# Patient Record
Sex: Female | Born: 1985 | Race: White | Hispanic: No | Marital: Single | Smoking: Current every day smoker
Health system: Southern US, Community
[De-identification: ages and names within clinical notes are randomized; demographics above are authoritative.]

## PROBLEM LIST (undated history)

## (undated) DIAGNOSIS — G8929 Other chronic pain: Secondary | ICD-10-CM

## (undated) DIAGNOSIS — M869 Osteomyelitis, unspecified: Secondary | ICD-10-CM

## (undated) DIAGNOSIS — F419 Anxiety disorder, unspecified: Secondary | ICD-10-CM

## (undated) DIAGNOSIS — M797 Fibromyalgia: Secondary | ICD-10-CM

## (undated) DIAGNOSIS — M549 Dorsalgia, unspecified: Secondary | ICD-10-CM

## (undated) DIAGNOSIS — K219 Gastro-esophageal reflux disease without esophagitis: Secondary | ICD-10-CM

## (undated) DIAGNOSIS — M1990 Unspecified osteoarthritis, unspecified site: Secondary | ICD-10-CM

## (undated) DIAGNOSIS — I10 Essential (primary) hypertension: Secondary | ICD-10-CM

## (undated) DIAGNOSIS — F32A Depression, unspecified: Secondary | ICD-10-CM

## (undated) DIAGNOSIS — I499 Cardiac arrhythmia, unspecified: Secondary | ICD-10-CM

## (undated) DIAGNOSIS — R0602 Shortness of breath: Secondary | ICD-10-CM

## (undated) DIAGNOSIS — F329 Major depressive disorder, single episode, unspecified: Secondary | ICD-10-CM

## (undated) DIAGNOSIS — R51 Headache: Secondary | ICD-10-CM

## (undated) HISTORY — PX: CHOLECYSTECTOMY: SHX55

## (undated) HISTORY — PX: BACK SURGERY: SHX140

## (undated) HISTORY — DX: Dorsalgia, unspecified: M54.9

## (undated) HISTORY — DX: Essential (primary) hypertension: I10

## (undated) HISTORY — DX: Other chronic pain: G89.29

## (undated) HISTORY — DX: Gastro-esophageal reflux disease without esophagitis: K21.9

## (undated) HISTORY — PX: HIP SURGERY: SHX245

## (undated) HISTORY — DX: Unspecified osteoarthritis, unspecified site: M19.90

## (undated) HISTORY — PX: TONSILLECTOMY: SUR1361

---
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
## 2006-08-17 ENCOUNTER — Emergency Department (HOSPITAL_COMMUNITY): Admission: EM | Admit: 2006-08-17 | Discharge: 2006-08-17 | Payer: Self-pay | Admitting: *Deleted

## 2007-01-09 ENCOUNTER — Encounter: Admission: RE | Admit: 2007-01-09 | Discharge: 2007-01-09 | Payer: Self-pay | Admitting: Specialist

## 2008-03-18 DIAGNOSIS — B2790 Infectious mononucleosis, unspecified without complication: Secondary | ICD-10-CM | POA: Insufficient documentation

## 2008-03-18 DIAGNOSIS — Z8669 Personal history of other diseases of the nervous system and sense organs: Secondary | ICD-10-CM | POA: Insufficient documentation

## 2008-03-19 ENCOUNTER — Ambulatory Visit: Payer: Self-pay | Admitting: Internal Medicine

## 2008-03-19 DIAGNOSIS — K219 Gastro-esophageal reflux disease without esophagitis: Secondary | ICD-10-CM | POA: Insufficient documentation

## 2008-03-19 DIAGNOSIS — J309 Allergic rhinitis, unspecified: Secondary | ICD-10-CM | POA: Insufficient documentation

## 2008-03-19 DIAGNOSIS — I10 Essential (primary) hypertension: Secondary | ICD-10-CM | POA: Insufficient documentation

## 2008-03-19 DIAGNOSIS — J45909 Unspecified asthma, uncomplicated: Secondary | ICD-10-CM | POA: Insufficient documentation

## 2008-03-19 DIAGNOSIS — J40 Bronchitis, not specified as acute or chronic: Secondary | ICD-10-CM | POA: Insufficient documentation

## 2008-04-19 ENCOUNTER — Ambulatory Visit: Payer: Self-pay | Admitting: Internal Medicine

## 2008-04-19 ENCOUNTER — Encounter: Payer: Self-pay | Admitting: Internal Medicine

## 2008-04-19 DIAGNOSIS — R0602 Shortness of breath: Secondary | ICD-10-CM | POA: Insufficient documentation

## 2008-08-21 ENCOUNTER — Emergency Department (HOSPITAL_COMMUNITY): Admission: EM | Admit: 2008-08-21 | Discharge: 2008-08-21 | Payer: Self-pay | Admitting: Family Medicine

## 2008-08-28 ENCOUNTER — Encounter: Admission: RE | Admit: 2008-08-28 | Discharge: 2008-08-28 | Payer: Self-pay | Admitting: Specialist

## 2010-08-26 ENCOUNTER — Encounter (INDEPENDENT_AMBULATORY_CARE_PROVIDER_SITE_OTHER): Payer: Self-pay | Admitting: *Deleted

## 2010-12-21 ENCOUNTER — Encounter: Payer: Self-pay | Admitting: Neurology

## 2010-12-29 NOTE — Letter (Signed)
Summary: New Patient letter  [facility]  [address] [place], [place] [address]   Phone: [phone]  Fax: [phone]       08/26/2010 MRN: [id number]  Claire [name] [address] [place], [place]  [address]  Dear Ms. Claire [name],  Welcome to the Gastroenterology Division at [facility].    You are scheduled to see Dr. Christella Hartigan on 10/09/2010 at 9:15AM on the 3rd floor at [facility], [address]  We ask that you try to arrive at our office 15 minutes prior to your appointment time to allow for check-in.  We would like you to complete the enclosed self-administered evaluation form prior to your visit and bring it with you on the day of your appointment.  We will review it with you.  Also, please bring a complete list of all your medications or, if you prefer, bring the medication bottles and we will list them.  Please bring your insurance card so that we may make a copy of it.  If your insurance requires a referral to see a specialist, please bring your referral form from your primary care physician.  Co-payments are due at the time of your visit and may be paid by cash, check or credit card.     Your office visit will consist of a consult with your physician (includes a physical exam), any laboratory testing he/she may order, scheduling of any necessary diagnostic testing (e.g. x-ray, ultrasound, CT-scan), and scheduling of a procedure (e.g. Endoscopy, Colonoscopy) if required.  Please allow enough time on your schedule to allow for any/all of these possibilities.    If you cannot keep your appointment, please call [phone] to cancel or reschedule prior to your appointment date.  This allows [place] the opportunity to schedule an appointment for another patient in need of care.  If you do not cancel or reschedule by 5 p.m. the business day prior to your appointment date, you will be charged a $50.00 late cancellation/no-show fee.    Thank you for choosing  [facility] for your medical needs.  We appreciate the opportunity to care for you.  Please visit [place] at our website  to learn more about our practice.                     Sincerely,                                                             The Gastroenterology Division

## 2011-02-18 ENCOUNTER — Other Ambulatory Visit (HOSPITAL_COMMUNITY): Payer: Self-pay | Admitting: General Surgery

## 2011-02-24 ENCOUNTER — Ambulatory Visit: Payer: BC Managed Care – PPO | Admitting: *Deleted

## 2011-02-24 ENCOUNTER — Ambulatory Visit (HOSPITAL_COMMUNITY)
Admission: RE | Admit: 2011-02-24 | Discharge: 2011-02-24 | Disposition: A | Discharge status: Home / Self Care | Payer: BC Managed Care – PPO | Source: Ambulatory Visit | Attending: General Surgery | Admitting: General Surgery

## 2011-02-24 ENCOUNTER — Ambulatory Visit (HOSPITAL_COMMUNITY): Payer: BC Managed Care – PPO

## 2011-02-24 ENCOUNTER — Other Ambulatory Visit (HOSPITAL_COMMUNITY): Payer: Self-pay | Admitting: General Surgery

## 2011-02-24 DIAGNOSIS — F329 Major depressive disorder, single episode, unspecified: Secondary | ICD-10-CM | POA: Insufficient documentation

## 2011-02-24 DIAGNOSIS — M129 Arthropathy, unspecified: Secondary | ICD-10-CM | POA: Insufficient documentation

## 2011-02-24 DIAGNOSIS — IMO0001 Reserved for inherently not codable concepts without codable children: Secondary | ICD-10-CM | POA: Insufficient documentation

## 2011-02-24 DIAGNOSIS — M25559 Pain in unspecified hip: Secondary | ICD-10-CM | POA: Insufficient documentation

## 2011-02-24 DIAGNOSIS — Z6841 Body Mass Index (BMI) 40.0 and over, adult: Secondary | ICD-10-CM | POA: Insufficient documentation

## 2011-02-24 DIAGNOSIS — F17200 Nicotine dependence, unspecified, uncomplicated: Secondary | ICD-10-CM | POA: Insufficient documentation

## 2011-02-24 DIAGNOSIS — F3289 Other specified depressive episodes: Secondary | ICD-10-CM | POA: Insufficient documentation

## 2011-02-24 DIAGNOSIS — Z13820 Encounter for screening for osteoporosis: Secondary | ICD-10-CM | POA: Insufficient documentation

## 2011-03-08 ENCOUNTER — Ambulatory Visit (HOSPITAL_COMMUNITY)
Admission: RE | Admit: 2011-03-08 | Discharge: 2011-03-08 | Disposition: A | Discharge status: Home / Self Care | Payer: BC Managed Care – PPO | Source: Ambulatory Visit | Attending: General Surgery | Admitting: General Surgery

## 2011-03-08 DIAGNOSIS — Z01818 Encounter for other preprocedural examination: Secondary | ICD-10-CM | POA: Insufficient documentation

## 2011-03-08 DIAGNOSIS — Z6841 Body Mass Index (BMI) 40.0 and over, adult: Secondary | ICD-10-CM | POA: Insufficient documentation

## 2011-03-10 ENCOUNTER — Encounter: Payer: BC Managed Care – PPO | Admitting: *Deleted

## 2011-03-18 ENCOUNTER — Encounter: Payer: BC Managed Care – PPO | Admitting: *Deleted

## 2011-04-08 ENCOUNTER — Encounter: Payer: BC Managed Care – PPO | Attending: General Surgery | Admitting: *Deleted

## 2011-04-08 DIAGNOSIS — Z713 Dietary counseling and surveillance: Secondary | ICD-10-CM | POA: Insufficient documentation

## 2011-04-08 DIAGNOSIS — Z01818 Encounter for other preprocedural examination: Secondary | ICD-10-CM | POA: Insufficient documentation

## 2011-04-16 NOTE — Op Note (Signed)
[facility]  Patient:    Claire [name], Claire [name]              MRN: [id number] Proc. Date: 03/09/01 Adm. Date:  [id number] Attending:  Cain Sieve                           Operative Report  PREOPERATIVE DIAGNOSES:  Retained cannulated screw, left hip, following pinning for a left slipped capital femoral hip epiphysis.  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  PROCEDURE:  Removal of retained cannulated screw, left hip.  SURGEON:  Vania Rea. Supple, M.D.  ANESTHESIA:  General endotracheal.  ESTIMATED BLOOD LOSS:  50 cc.  DRAINS:  None.  HISTORY:  Pavneet is a 25 year old female, without approximately two years ago underwent a left hip pinning for a slipped capital femoral epiphysis.  She has done well, has returned to all of her regular activities.  Recent radiographs show complete healing of the proximal femoral physis on the left.  She is brought to the operating room at this time for planned elective removal of her retained hip screw.  Preoperatively Santrice and her family were counseled on treatment options as well as risks versus benefits.  Possible complications of bleeding, infection, neurovascular injury, a DVT, persistence of pain, failure to be able to completely remove the implant, possibility for femoral neck fracture secondary to stress riser effect were all reviewed.  They understand and accept and agree with our planned procedure.  DESCRIPTION OF PROCEDURE:  After undergoing routine preoperative evaluation, the patient was brought to the operating room and placed supine on the fracture table where she underwent smooth induction of general endotracheal anesthesia.  Received 1 gram of IV cephalosporin prophylactically.  Left leg was placed into gentle longitudinal traction with right leg placed in well leg holder.  She was appropriately  padded and protected.  Fluoroscopy was brought in and used to confirm appropriate visualization of the hips and hip screw on both AP and lateral views.  Left hip girdle region was then sterilely prepped and draped in a standard fashion.  Utilizing fluoroscopic guidance, alignment for appropriate approach to the hip screw was then made, and a stab wound was then made at the site of the previous operative site, approximately 2 cm in length.  We initially utilized the arthroscopic arthroscope cannula and trocar to bluntly dissect down to the appropriate point at the screw.  A guidepin was placed through this, gaining access into the canal of the cannulated screw. We then placed a T-handled screw over this.  Good engagement was obtained. The screw was very firmly seated and as we attempted to extract the screw, the threads on the tip of the cannulated titanium screw stripped.  It was not possible to extract the screw in this manner.  At this point, we obtained the large trephines and passed them over the guidepin and over the head of the cannulated screw and progressively reamed down around the outside of the cannulated screw to the depth of the threads at the tip of the screw.  Proper positioning and appropriate progress were confirmed with both AP and lateral fluoroscopic images.  Once we had achieved an appropriate depth to loosen the screw, we then placed an Easy-Out screw remover into the canal of the cannulated screw.  This was attached to a manual T-handled Jacobs chuck.  We then carefully  used this to gain good purchase on the screw and were then able to successfully remove the screw in its entirety.  At this point, fluoroscopic images were then used to confirm that the screw had been removed. No evidence for intraoperative fractures.  The wound was then copiously irrigated.  The lateral skin incision had been extended to allow the T-handled Jacobs chuck to pass for a total length of  approximately 4 cm.  This was then closed with 2-0 Vicryl to the subcutaneous and a running intracuticular 4-0 Monocryl for the skin.  Steri-Strips were applied.  We then injected approximately 20 cc of 0.5% Marcaine with epinephrine into the peri-incisional soft tissues.  A bulky dry dressing was then taped over the left hip.  The patient was then placed in the supine position, extubated, and taken to the recovery room in stable condition. DD:  03/09/01 TD:  03/09/01 Job: [id number]
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
## 2011-08-30 LAB — RAPID URINE DRUG SCREEN, HOSP PERFORMED
Amphetamines: NOT DETECTED
Tetrahydrocannabinol: NOT DETECTED

## 2011-09-29 ENCOUNTER — Inpatient Hospital Stay (HOSPITAL_COMMUNITY): Admission: RE | Admit: 2011-09-29 | Payer: BC Managed Care – PPO | Source: Ambulatory Visit | Admitting: General Surgery

## 2011-12-15 NOTE — Progress Notes (Signed)
  Bariatric Class:  Appt start time: 0830 end time:  0930.  Pre-Operative Nutrition Class  Patient was seen on 12/16/11 for Pre-Operative Bariatric Surgery Education at the [facility].  Surgery date: 01/03/12 Surgery type: RNY Gastric Bypass  Weight today: None taken Last weight: 299.5 lbs (04/08/11) BMI: 44.2  Samples given per MNT protocol: Bariatric Advantage Multivitamin Lot # [id number] Exp: 9/13  Bariatric Advantage Calcium Citrate Lot # [id number] Exp: 12/13  Celebrate Vitamins B12 ( ) Lot # [id number] Exp: 7/14  Celebrate Vitamins Multivitamin Lot # [id number] Exp:6/14  Celebrate VitaminsCalcium Citrate Lot # [id number] Exp: 5/14  Unjury Protein - Chicken Soup Lot # [id number] Exp: 8/13  The following the learning objective met by the patient during this course:   Identifies Pre-Op Dietary Goals and will begin 2 weeks pre-operatively   Identifies appropriate sources of fluids and proteins   States protein recommendations and appropriate sources pre and post-operatively  Identifies Post-Operative Dietary Goals and will follow for 2 weeks post-operatively  Identifies appropriate multivitamin and calcium sources  Describes the need for physical activity post-operatively and will follow MD recommendations  States when to call healthcare provider regarding medication questions or post-operative complications  Handouts given during class include:  Pre-Op Bariatric Surgery Diet Handout  Protein Shake Handout  Post-Op Bariatric Surgery Nutrition Handout  BELT Program Information Flyer  Support Group Information Flyer  Follow-Up Plan: Patient will follow-up at [facility] 2 weeks post operatively for diet advancement per MD.

## 2011-12-15 NOTE — Patient Instructions (Signed)
Follow:    Pre-Op Diet per MD 2 weeks prior to surgery  Phase 2- Liquids (clear/full) 2 weeks after surgery  Vitamin/Mineral/Calcium guidelines for purchasing bariatric supplements  Exercise guidelines pre and post-op per MD  Follow-up at [facility] in 2 weeks post-op for diet advancement. Contact Kenyetta Wimbish as needed with questions/concerns.  

## 2011-12-16 ENCOUNTER — Encounter: Payer: BC Managed Care – PPO | Attending: General Surgery

## 2011-12-16 DIAGNOSIS — Z713 Dietary counseling and surveillance: Secondary | ICD-10-CM | POA: Insufficient documentation

## 2011-12-16 DIAGNOSIS — Z01818 Encounter for other preprocedural examination: Secondary | ICD-10-CM | POA: Insufficient documentation

## 2011-12-21 ENCOUNTER — Encounter (HOSPITAL_COMMUNITY): Payer: Self-pay | Admitting: Pharmacy Technician

## 2011-12-22 ENCOUNTER — Encounter (INDEPENDENT_AMBULATORY_CARE_PROVIDER_SITE_OTHER): Payer: Self-pay | Admitting: General Surgery

## 2011-12-22 ENCOUNTER — Ambulatory Visit (INDEPENDENT_AMBULATORY_CARE_PROVIDER_SITE_OTHER): Payer: BC Managed Care – PPO | Admitting: General Surgery

## 2011-12-22 MED ORDER — BUPIVACAINE 0.25 % ON-Q PUMP DUAL CATH 300 ML: 300.0000 mL | INJECTION | Status: DC
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
## 2011-12-29 ENCOUNTER — Encounter (HOSPITAL_COMMUNITY): Payer: Self-pay

## 2011-12-29 ENCOUNTER — Encounter (HOSPITAL_COMMUNITY)
Admission: RE | Admit: 2011-12-29 | Discharge: 2011-12-29 | Disposition: A | Discharge status: Home / Self Care | Payer: BC Managed Care – PPO | Source: Ambulatory Visit | Attending: General Surgery | Admitting: General Surgery

## 2011-12-29 HISTORY — DX: Major depressive disorder, single episode, unspecified: F32.9

## 2011-12-29 HISTORY — DX: Anxiety disorder, unspecified: F41.9

## 2011-12-29 HISTORY — DX: Fibromyalgia: M79.7

## 2011-12-29 HISTORY — DX: Headache: R51

## 2011-12-29 HISTORY — DX: Shortness of breath: R06.02

## 2011-12-29 HISTORY — DX: Depression, unspecified: F32.A

## 2011-12-29 HISTORY — DX: Cardiac arrhythmia, unspecified: I49.9

## 2011-12-29 LAB — CBC
HCT: 48.3 % — ABNORMAL HIGH (ref 36.0–46.0)
Hemoglobin: 16.6 g/dL — ABNORMAL HIGH (ref 12.0–15.0)
MCV: 86.9 fL (ref 78.0–100.0)
RBC: 5.56 MIL/uL — ABNORMAL HIGH (ref 3.87–5.11)
RDW: 12.6 % (ref 11.5–15.5)
WBC: 8.9 10*3/uL (ref 4.0–10.5)

## 2011-12-29 LAB — DIFFERENTIAL
Eosinophils Absolute: 0.7 10*3/uL (ref 0.0–0.7)
Eosinophils Relative: 8 % — ABNORMAL HIGH (ref 0–5)
Lymphs Abs: 2 10*3/uL (ref 0.7–4.0)
Monocytes Absolute: 0.4 10*3/uL (ref 0.1–1.0)
Monocytes Relative: 5 % (ref 3–12)

## 2011-12-29 LAB — COMPREHENSIVE METABOLIC PANEL
BUN: 19 mg/dL (ref 6–23)
CO2: 26 mEq/L (ref 19–32)
Chloride: 101 mEq/L (ref 96–112)
Creatinine, Ser: 1.05 mg/dL (ref 0.50–1.10)
GFR calc Af Amer: 84 mL/min — ABNORMAL LOW (ref >90)
GFR calc non Af Amer: 73 mL/min — ABNORMAL LOW (ref >90)
Glucose, Bld: 90 mg/dL (ref 70–99)
Total Bilirubin: 0.3 mg/dL (ref 0.3–1.2)

## 2011-12-29 LAB — HCG, SERUM, QUALITATIVE: Preg, Serum: NEGATIVE

## 2011-12-29 NOTE — Patient Instructions (Signed)
19 Claire [name]  12/29/2011   Your procedure is scheduled on:  01/03/12 [id number]  Report to [facility] at 0515 AM.  Call this number if you have problems the morning of surgery: [phone]   Remember:   Do not eat food:After Midnight.  May have clear liquids:until Midnight .  Marland Kitchen  Take these medicines the morning of surgery with A SIP OF WATER:    Do not wear jewelry, make-up or nail polish.  Do not wear lotions, powders, or perfumes.   Do not shave 48 hours prior to surgery.  Do not bring valuables to the hospital.  Contacts, dentures or bridgework may not be worn into surgery.  Leave suitcase in the car. After surgery it may be brought to your room.  For patients admitted to the hospital, checkout time is 11:00 AM the day of discharge.       Special Instructions: CHG Shower Use Special Wash: 1/2 bottle night before surgery and 1/2 bottle morning of surgery. shower chin to toes with CHG.  Wash face and private parts with regular soap.     Please read over the following fact sheets that you were given: MRSA Information, coughing and deep breathing exercises, leg exercises

## 2011-12-31 NOTE — Pre-Procedure Instructions (Signed)
12/31/11 PT states she did not feel well on preop appt.  Nurse called today to followup on pt status and mother stated she was feeling better.  Mother states had a problem with some asthma and she had to put her back on her nebulizer.  Nurse instructed mother to inform Dr Johna Sheriff today of pt status .  Mother voiced understanding.

## 2012-01-03 ENCOUNTER — Encounter (HOSPITAL_COMMUNITY): Payer: Self-pay | Admitting: Surgery

## 2012-01-03 ENCOUNTER — Inpatient Hospital Stay (HOSPITAL_COMMUNITY)
Admission: RE | Admit: 2012-01-03 | Discharge: 2012-01-06 | DRG: 288 | Disposition: A | Discharge status: Home / Self Care | Payer: BC Managed Care – PPO | Source: Ambulatory Visit | Attending: General Surgery | Admitting: General Surgery

## 2012-01-03 ENCOUNTER — Encounter (HOSPITAL_COMMUNITY): Payer: Self-pay | Admitting: *Deleted

## 2012-01-03 ENCOUNTER — Encounter (HOSPITAL_COMMUNITY)
Admission: RE | Disposition: A | Discharge status: Home / Self Care | Payer: Self-pay | Source: Ambulatory Visit | Attending: General Surgery

## 2012-01-03 ENCOUNTER — Inpatient Hospital Stay (HOSPITAL_COMMUNITY): Payer: BC Managed Care – PPO | Admitting: *Deleted

## 2012-01-03 DIAGNOSIS — K219 Gastro-esophageal reflux disease without esophagitis: Secondary | ICD-10-CM | POA: Diagnosis present

## 2012-01-03 DIAGNOSIS — J45909 Unspecified asthma, uncomplicated: Secondary | ICD-10-CM

## 2012-01-03 DIAGNOSIS — Z6841 Body Mass Index (BMI) 40.0 and over, adult: Secondary | ICD-10-CM

## 2012-01-03 DIAGNOSIS — M129 Arthropathy, unspecified: Secondary | ICD-10-CM | POA: Diagnosis present

## 2012-01-03 DIAGNOSIS — Z87891 Personal history of nicotine dependence: Secondary | ICD-10-CM

## 2012-01-03 DIAGNOSIS — Z01812 Encounter for preprocedural laboratory examination: Secondary | ICD-10-CM

## 2012-01-03 DIAGNOSIS — I10 Essential (primary) hypertension: Secondary | ICD-10-CM

## 2012-01-03 HISTORY — PX: GASTRIC ROUX-EN-Y: SHX5262

## 2012-01-03 LAB — HEMOGLOBIN AND HEMATOCRIT, BLOOD
HCT: 45.1 % (ref 36.0–46.0)
Hemoglobin: 15.2 g/dL — ABNORMAL HIGH (ref 12.0–15.0)

## 2012-01-03 SURGERY — LAPAROSCOPIC ROUX-EN-Y GASTRIC
Anesthesia: General | Site: Abdomen | Wound class: Clean Contaminated

## 2012-01-03 MED ORDER — BUPIVACAINE-EPINEPHRINE 0.25% -1:200000 IJ SOLN
INTRAMUSCULAR | Status: DC | PRN
  Administered 2012-01-03: 35 mL

## 2012-01-03 MED ORDER — ACETAMINOPHEN 10 MG/ML IV SOLN
INTRAVENOUS | Status: AC
  Filled 2012-01-03: qty 100

## 2012-01-03 MED ORDER — GLYCOPYRROLATE 0.2 MG/ML IJ SOLN
INTRAMUSCULAR | Status: DC | PRN
  Administered 2012-01-03: 0.2 mg via INTRAVENOUS
  Administered 2012-01-03: .1 mg via INTRAVENOUS

## 2012-01-03 MED ORDER — MEPERIDINE HCL 25 MG/ML IJ SOLN: 6.2500 mg | INTRAMUSCULAR | Status: DC | PRN

## 2012-01-03 MED ORDER — TISSEEL VH 10 ML EX KIT
PACK | CUTANEOUS | Status: DC | PRN
  Administered 2012-01-03: 10 mL

## 2012-01-03 MED ORDER — OXYCODONE-ACETAMINOPHEN 5-325 MG/5ML PO SOLN
5.0000 mL | ORAL | Status: DC | PRN
  Administered 2012-01-05: 10 mL via ORAL
  Administered 2012-01-06: 5 mL via ORAL
  Filled 2012-01-03: qty 10
  Filled 2012-01-03: qty 5

## 2012-01-03 MED ORDER — CEFOXITIN SODIUM-DEXTROSE 1-4 GM-% IV SOLR (PREMIX)
INTRAVENOUS | Status: AC
  Filled 2012-01-03: qty 100

## 2012-01-03 MED ORDER — BUPIVACAINE-EPINEPHRINE 0.25% -1:200000 IJ SOLN
INTRAMUSCULAR | Status: AC
  Filled 2012-01-03: qty 1

## 2012-01-03 MED ORDER — KCL IN DEXTROSE-NACL 20-5-0.9 MEQ/L-%-% IV SOLN
INTRAVENOUS | Status: DC
  Administered 2012-01-03 – 2012-01-06 (×5): via INTRAVENOUS
  Filled 2012-01-03 (×10): qty 1000

## 2012-01-03 MED ORDER — HYDROMORPHONE HCL PF 1 MG/ML IJ SOLN
1.0000 mg | INTRAMUSCULAR | Status: DC | PRN
  Administered 2012-01-03 (×2): 2 mg via INTRAVENOUS
  Administered 2012-01-03 (×2): 1 mg via INTRAVENOUS
  Administered 2012-01-04 (×7): 2 mg via INTRAVENOUS
  Administered 2012-01-04: 1 mg via INTRAVENOUS
  Administered 2012-01-04 – 2012-01-05 (×4): 2 mg via INTRAVENOUS
  Administered 2012-01-05: 1 mg via INTRAVENOUS
  Administered 2012-01-05 (×4): 2 mg via INTRAVENOUS
  Administered 2012-01-06 (×2): 1 mg via INTRAVENOUS
  Administered 2012-01-06: 2 mg via INTRAVENOUS
  Administered 2012-01-06: 1 mg via INTRAVENOUS
  Filled 2012-01-03: qty 1
  Filled 2012-01-03 (×12): qty 2
  Filled 2012-01-03: qty 1
  Filled 2012-01-03 (×2): qty 2
  Filled 2012-01-03: qty 1
  Filled 2012-01-03 (×2): qty 2
  Filled 2012-01-03: qty 1
  Filled 2012-01-03: qty 2
  Filled 2012-01-03: qty 1
  Filled 2012-01-03 (×3): qty 2

## 2012-01-03 MED ORDER — LACTATED RINGERS IV SOLN
INTRAVENOUS | Status: DC
  Administered 2012-01-03: 12:00:00 via INTRAVENOUS

## 2012-01-03 MED ORDER — BUPIVACAINE 0.25 % ON-Q PUMP DUAL CATH 300 ML: 300.0000 mL | INJECTION | Status: DC

## 2012-01-03 MED ORDER — ONDANSETRON HCL 4 MG/2ML IJ SOLN
4.0000 mg | INTRAMUSCULAR | Status: DC | PRN
  Administered 2012-01-03 – 2012-01-06 (×12): 4 mg via INTRAVENOUS
  Filled 2012-01-03 (×13): qty 2

## 2012-01-03 MED ORDER — PROMETHAZINE HCL 25 MG/ML IJ SOLN
INTRAMUSCULAR | Status: AC
  Filled 2012-01-03: qty 1

## 2012-01-03 MED ORDER — HYDROMORPHONE HCL PF 1 MG/ML IJ SOLN
INTRAMUSCULAR | Status: AC
  Filled 2012-01-03: qty 1

## 2012-01-03 MED ORDER — ALBUTEROL SULFATE (5 MG/ML) 0.5% IN NEBU: 2.5000 mg | INHALATION_SOLUTION | Freq: Four times a day (QID) | RESPIRATORY_TRACT | Status: DC | PRN

## 2012-01-03 MED ORDER — LIDOCAINE HCL (CARDIAC) 20 MG/ML IV SOLN
INTRAVENOUS | Status: DC | PRN
  Administered 2012-01-03: 80 mg via INTRAVENOUS

## 2012-01-03 MED ORDER — METOPROLOL TARTRATE 1 MG/ML IV SOLN
10.0000 mg | Freq: Four times a day (QID) | INTRAVENOUS | Status: DC
  Administered 2012-01-03 – 2012-01-06 (×10): 10 mg via INTRAVENOUS
  Filled 2012-01-03 (×16): qty 10

## 2012-01-03 MED ORDER — DEXAMETHASONE SODIUM PHOSPHATE 10 MG/ML IJ SOLN
INTRAMUSCULAR | Status: DC | PRN
  Administered 2012-01-03: 10 mg via INTRAVENOUS

## 2012-01-03 MED ORDER — 0.9 % SODIUM CHLORIDE (POUR BTL) OPTIME
TOPICAL | Status: DC | PRN
  Administered 2012-01-03: 1000 mL

## 2012-01-03 MED ORDER — LACTATED RINGERS IV SOLN
INTRAVENOUS | Status: DC | PRN
  Administered 2012-01-03 (×3): via INTRAVENOUS

## 2012-01-03 MED ORDER — BUDESONIDE-FORMOTEROL FUMARATE 160-4.5 MCG/ACT IN AERO
2.0000 | INHALATION_SPRAY | Freq: Two times a day (BID) | RESPIRATORY_TRACT | Status: DC
  Administered 2012-01-03 – 2012-01-06 (×7): 2 via RESPIRATORY_TRACT
  Filled 2012-01-03: qty 6

## 2012-01-03 MED ORDER — FENTANYL CITRATE 0.05 MG/ML IJ SOLN
INTRAMUSCULAR | Status: DC | PRN
  Administered 2012-01-03 (×2): 50 ug via INTRAVENOUS
  Administered 2012-01-03: 100 ug via INTRAVENOUS
  Administered 2012-01-03: 50 ug via INTRAVENOUS
  Administered 2012-01-03 (×3): 100 ug via INTRAVENOUS
  Administered 2012-01-03 (×4): 50 ug via INTRAVENOUS

## 2012-01-03 MED ORDER — UNJURY CHICKEN SOUP POWDER: 2.0000 [oz_av] | Freq: Four times a day (QID) | ORAL | Status: DC

## 2012-01-03 MED ORDER — NEOSTIGMINE METHYLSULFATE 1 MG/ML IJ SOLN
INTRAMUSCULAR | Status: DC | PRN
  Administered 2012-01-03: 1 mg via INTRAVENOUS

## 2012-01-03 MED ORDER — FIBRIN SEALANT COMPONENT 5 ML EX KIT
PACK | CUTANEOUS | Status: AC
  Filled 2012-01-03: qty 4

## 2012-01-03 MED ORDER — UNJURY CHOCOLATE CLASSIC POWDER
2.0000 [oz_av] | Freq: Four times a day (QID) | ORAL | Status: DC
  Administered 2012-01-05 – 2012-01-06 (×3): 2 [oz_av] via ORAL

## 2012-01-03 MED ORDER — HEPARIN SODIUM (PORCINE) 5000 UNIT/ML IJ SOLN
5000.0000 [IU] | INTRAMUSCULAR | Status: AC
  Administered 2012-01-03: 5000 [IU] via SUBCUTANEOUS

## 2012-01-03 MED ORDER — DROPERIDOL 2.5 MG/ML IJ SOLN
0.6250 mg | INTRAMUSCULAR | Status: DC | PRN
  Administered 2012-01-03: 0.625 mg via INTRAVENOUS
  Filled 2012-01-03: qty 0.25

## 2012-01-03 MED ORDER — PROPOFOL 10 MG/ML IV EMUL
INTRAVENOUS | Status: DC | PRN
  Administered 2012-01-03: 200 mg via INTRAVENOUS

## 2012-01-03 MED ORDER — PROMETHAZINE HCL 25 MG/ML IJ SOLN
6.2500 mg | INTRAMUSCULAR | Status: AC | PRN
  Administered 2012-01-03 (×2): 6.25 mg via INTRAVENOUS

## 2012-01-03 MED ORDER — ACETAMINOPHEN 10 MG/ML IV SOLN
INTRAVENOUS | Status: DC | PRN
  Administered 2012-01-03: 1000 mg via INTRAVENOUS

## 2012-01-03 MED ORDER — UNJURY VANILLA POWDER
2.0000 [oz_av] | Freq: Four times a day (QID) | ORAL | Status: DC
  Administered 2012-01-05: 2 [oz_av] via ORAL

## 2012-01-03 MED ORDER — HEPARIN SODIUM (PORCINE) 5000 UNIT/ML IJ SOLN
5000.0000 [IU] | Freq: Three times a day (TID) | INTRAMUSCULAR | Status: DC
  Administered 2012-01-03 – 2012-01-06 (×8): 5000 [IU] via SUBCUTANEOUS
  Filled 2012-01-03 (×11): qty 1

## 2012-01-03 MED ORDER — DEXTROSE 5 % IV SOLN: 2.0000 g | INTRAVENOUS | Status: DC

## 2012-01-03 MED ORDER — LACTATED RINGERS IV SOLN: INTRAVENOUS | Status: DC

## 2012-01-03 MED ORDER — DEXTROSE 5 % IV SOLN
1.0000 g | INTRAVENOUS | Status: DC | PRN
  Administered 2012-01-03: 1 g via INTRAVENOUS

## 2012-01-03 MED ORDER — ONDANSETRON HCL 4 MG/2ML IJ SOLN
INTRAMUSCULAR | Status: DC | PRN
  Administered 2012-01-03: 3 mg via INTRAVENOUS
  Administered 2012-01-03: 1 mg via INTRAVENOUS

## 2012-01-03 MED ORDER — MIDAZOLAM HCL 5 MG/5ML IJ SOLN
INTRAMUSCULAR | Status: DC | PRN
  Administered 2012-01-03 (×2): 1 mg via INTRAVENOUS

## 2012-01-03 MED ORDER — STERILE WATER FOR IRRIGATION IR SOLN
Status: DC | PRN
  Administered 2012-01-03: 1500 mL

## 2012-01-03 MED ORDER — ACETAMINOPHEN 160 MG/5ML PO SOLN: 650.0000 mg | ORAL | Status: DC | PRN

## 2012-01-03 MED ORDER — SUCCINYLCHOLINE CHLORIDE 20 MG/ML IJ SOLN
INTRAMUSCULAR | Status: DC | PRN
  Administered 2012-01-03: 140 mg via INTRAVENOUS

## 2012-01-03 MED ORDER — ALBUTEROL SULFATE HFA 108 (90 BASE) MCG/ACT IN AERS
2.0000 | INHALATION_SPRAY | Freq: Four times a day (QID) | RESPIRATORY_TRACT | Status: DC | PRN
  Filled 2012-01-03: qty 6.7

## 2012-01-03 MED ORDER — CISATRACURIUM BESYLATE 2 MG/ML IV SOLN
INTRAVENOUS | Status: DC | PRN
  Administered 2012-01-03: 2 mg via INTRAVENOUS
  Administered 2012-01-03: 6 mg via INTRAVENOUS
  Administered 2012-01-03: 2 mg via INTRAVENOUS
  Administered 2012-01-03 (×2): 6 mg via INTRAVENOUS

## 2012-01-03 MED ORDER — HYDROMORPHONE HCL PF 1 MG/ML IJ SOLN
INTRAMUSCULAR | Status: AC
Start: 2012-01-03 — End: 2012-01-03
  Filled 2012-01-03: qty 1

## 2012-01-03 MED ORDER — PROMETHAZINE HCL 25 MG/ML IJ SOLN
12.5000 mg | INTRAMUSCULAR | Status: DC | PRN
  Administered 2012-01-03 – 2012-01-06 (×13): 12.5 mg via INTRAVENOUS
  Filled 2012-01-03 (×13): qty 1

## 2012-01-03 MED ORDER — HYDROMORPHONE HCL PF 1 MG/ML IJ SOLN
0.2500 mg | INTRAMUSCULAR | Status: DC | PRN
  Administered 2012-01-03 (×4): 0.5 mg via INTRAVENOUS

## 2012-01-03 MED ORDER — ALBUTEROL SULFATE HFA 108 (90 BASE) MCG/ACT IN AERS
INHALATION_SPRAY | RESPIRATORY_TRACT | Status: AC
  Filled 2012-01-03: qty 6.7

## 2012-01-03 MED ORDER — LACTATED RINGERS IR SOLN
Status: DC | PRN
  Administered 2012-01-03: 3000 mL

## 2012-01-03 MED ORDER — BUPIVACAINE 0.25 % ON-Q PUMP DUAL CATH 300 ML
300.0000 mL | INJECTION | Status: DC
  Administered 2012-01-03: 300 mL
  Filled 2012-01-03: qty 300

## 2012-01-03 SURGICAL SUPPLY — 87 items
ADH SKN CLS APL DERMABOND .7 (GAUZE/BANDAGES/DRESSINGS) ×1
APL SKNCLS STERI-STRIP NONHPOA (GAUZE/BANDAGES/DRESSINGS)
APL SRG 32X5 SNPLK LF DISP (MISCELLANEOUS) ×1
APPLICATOR COTTON TIP 6IN STRL (MISCELLANEOUS) ×2 IMPLANT
APPLIER CLIP ROT 13.4 12 LRG (CLIP)
APR CLP LRG 13.4X12 ROT 20 MLT (CLIP)
BAG SPEC RTRVL LRG 6X4 10 (ENDOMECHANICALS)
BENZOIN TINCTURE PRP APPL 2/3 (GAUZE/BANDAGES/DRESSINGS) IMPLANT
BLADE SURG 15 STRL LF DISP TIS (BLADE) IMPLANT
BLADE SURG 15 STRL SS (BLADE)
BLADE SURG SZ11 CARB STEEL (BLADE) ×2 IMPLANT
CABLE HIGH FREQUENCY MONO STRZ (ELECTRODE) ×1 IMPLANT
CANISTER SUCTION 2500CC (MISCELLANEOUS) ×2 IMPLANT
CATH KIT ON-Q SILVERSOAK 10 (CATHETERS) IMPLANT
CATH KIT ON-Q SILVERSOAKER 10 (CATHETERS) ×4 IMPLANT
CHLORAPREP W/TINT 26ML (MISCELLANEOUS) ×2 IMPLANT
CLIP APPLIE ROT 13.4 12 LRG (CLIP) IMPLANT
CLIP SUT LAPRA TY ABSORB (SUTURE) ×4 IMPLANT
CLOTH BEACON ORANGE TIMEOUT ST (SAFETY) ×2 IMPLANT
COVER SURGICAL LIGHT HANDLE (MISCELLANEOUS) ×2 IMPLANT
CUTTER LINEAR ENDO ART 45 ETS (STAPLE) ×2 IMPLANT
DECANTER SPIKE VIAL GLASS SM (MISCELLANEOUS) ×2 IMPLANT
DERMABOND ADVANCED (GAUZE/BANDAGES/DRESSINGS) ×1
DERMABOND ADVANCED .7 DNX12 (GAUZE/BANDAGES/DRESSINGS) IMPLANT
DEVICE SUTURE ENDOST 10MM (ENDOMECHANICALS) ×2 IMPLANT
DISSECTOR BLUNT TIP ENDO 5MM (MISCELLANEOUS) IMPLANT
DRAIN PENROSE 18X1/4 LTX STRL (WOUND CARE) ×2 IMPLANT
DRAPE CAMERA CLOSED 9X96 (DRAPES) ×2 IMPLANT
ELECT REM PT RETURN 9FT ADLT (ELECTROSURGICAL) ×2
ELECTRODE REM PT RTRN 9FT ADLT (ELECTROSURGICAL) ×1 IMPLANT
GAUZE SPONGE 4X4 16PLY XRAY LF (GAUZE/BANDAGES/DRESSINGS) ×2 IMPLANT
GLOVE BIOGEL PI IND STRL 7.0 (GLOVE) ×1 IMPLANT
GLOVE BIOGEL PI INDICATOR 7.0 (GLOVE) ×1
GOWN STRL NON-REIN LRG LVL3 (GOWN DISPOSABLE) ×2 IMPLANT
GOWN STRL REIN XL XLG (GOWN DISPOSABLE) ×4 IMPLANT
HEMOSTAT SURGICEL 4X8 (HEMOSTASIS) IMPLANT
HOVERMATT SINGLE USE (MISCELLANEOUS) ×2 IMPLANT
KIT BASIN OR (CUSTOM PROCEDURE TRAY) ×2 IMPLANT
KIT GASTRIC LAVAGE 34FR ADT (SET/KITS/TRAYS/PACK) ×2 IMPLANT
NDL SPNL 22GX3.5 QUINCKE BK (NEEDLE) ×1 IMPLANT
NEEDLE SPNL 22GX3.5 QUINCKE BK (NEEDLE) ×2 IMPLANT
NS IRRIG 1000ML POUR BTL (IV SOLUTION) ×2 IMPLANT
PACK CARDIOVASCULAR III (CUSTOM PROCEDURE TRAY) ×2 IMPLANT
PEN SKIN MARKING BROAD (MISCELLANEOUS) ×2 IMPLANT
POUCH SPECIMEN RETRIEVAL 10MM (ENDOMECHANICALS) IMPLANT
RELOAD 45 VASCULAR/THIN (ENDOMECHANICALS) ×2 IMPLANT
RELOAD BLUE (STAPLE) ×4 IMPLANT
RELOAD ENDO STITCH 2.0 (ENDOMECHANICALS) ×20
RELOAD GOLD (STAPLE) ×2 IMPLANT
RELOAD STAPLE 45 2.5 WHT GRN (ENDOMECHANICALS) ×2 IMPLANT
RELOAD STAPLE 45 3.5 BLU ETS (ENDOMECHANICALS) ×1 IMPLANT
RELOAD STAPLE TA45 3.5 REG BLU (ENDOMECHANICALS) ×4 IMPLANT
RELOAD SUT SNGL STCH ABSRB 2-0 (ENDOMECHANICALS) ×5 IMPLANT
RELOAD SUT SNGL STCH BLK 2-0 (ENDOMECHANICALS) ×3 IMPLANT
RELOAD WHITE ECR60W (STAPLE) ×1 IMPLANT
SCALPEL HARMONIC ACE (MISCELLANEOUS) ×2 IMPLANT
SCISSORS LAP 5X35 DISP (ENDOMECHANICALS) ×2 IMPLANT
SEALANT SURGICAL APPL DUAL CAN (MISCELLANEOUS) ×2 IMPLANT
SET IRRIG TUBING LAPAROSCOPIC (IRRIGATION / IRRIGATOR) ×2 IMPLANT
SLEEVE ADV FIXATION 12X100MM (TROCAR) ×4 IMPLANT
SLEEVE ADV FIXATION 5X100MM (TROCAR) ×2 IMPLANT
SOLUTION ANTI FOG 6CC (MISCELLANEOUS) ×2 IMPLANT
SPONGE GAUZE 4X4 12PLY (GAUZE/BANDAGES/DRESSINGS) ×2 IMPLANT
STAPLER STANDARD HANDLE (STAPLE) ×2 IMPLANT
STAPLER VISISTAT 35W (STAPLE) ×2 IMPLANT
STRIP CLOSURE SKIN 1/2X4 (GAUZE/BANDAGES/DRESSINGS) IMPLANT
SUT ETHILON 3 0 PS 1 (SUTURE) IMPLANT
SUT MNCRL AB 4-0 PS2 18 (SUTURE) ×3 IMPLANT
SUT RELOAD ENDO STITCH 2 48X1 (ENDOMECHANICALS) ×6
SUT RELOAD ENDO STITCH 2.0 (ENDOMECHANICALS) ×4
SUT SILK 2 0 SH (SUTURE) ×2 IMPLANT
SUT VIC AB 2-0 SH 27 (SUTURE) ×6
SUT VIC AB 2-0 SH 27X BRD (SUTURE) ×1 IMPLANT
SUTURE RELOAD END STTCH 2 48X1 (ENDOMECHANICALS) ×6 IMPLANT
SUTURE RELOAD ENDO STITCH 2.0 (ENDOMECHANICALS) ×4 IMPLANT
SYR 20CC LL (SYRINGE) ×2 IMPLANT
SYR 50ML LL SCALE MARK (SYRINGE) ×2 IMPLANT
SYR CONTROL 10ML LL (SYRINGE) ×2 IMPLANT
TOWEL OR 17X26 10 PK STRL BLUE (TOWEL DISPOSABLE) ×2 IMPLANT
TRAY FOLEY CATH 14FRSI W/METER (CATHETERS) ×2 IMPLANT
TROCAR ADV FIXATION 12X100MM (TROCAR) ×2 IMPLANT
TROCAR XCEL 12X100 BLDLESS (ENDOMECHANICALS) ×2 IMPLANT
TROCAR Z-THREAD FIOS 5X100MM (TROCAR) ×2 IMPLANT
TUBING ENDO SMARTCAP (MISCELLANEOUS) ×2 IMPLANT
TUBING FILTER THERMOFLATOR (ELECTROSURGICAL) ×2 IMPLANT
TUNNELER SHEATH ON-Q 16GX12 DP (PAIN MANAGEMENT) ×1 IMPLANT
WATER STERILE IRR 1500ML POUR (IV SOLUTION) ×2 IMPLANT

## 2012-01-03 NOTE — Preoperative (Signed)
Beta Blockers   Reason not to administer Beta Blockers:Not Applicable 

## 2012-01-03 NOTE — Interval H&P Note (Signed)
History and Physical Interval Note:  01/03/2012 7:10 AM  Claire [name]  has presented today for surgery, with the diagnosis of morbid obesity   The various methods of treatment have been discussed with the patient and family. After consideration of risks, benefits and other options for treatment, the patient has consented to  Procedure(s): LAPAROSCOPIC ROUX-EN-Y GASTRIC as a surgical intervention .  The patients' history has been reviewed, patient examined, no change in status, stable for surgery.  I have reviewed the patients' chart and labs.  Questions were answered to the patient's satisfaction.     [name]

## 2012-01-03 NOTE — Anesthesia Preprocedure Evaluation (Addendum)
Anesthesia Evaluation  Patient identified by MRN, date of birth, ID band Patient awake    Reviewed: Allergy & Precautions, H&P , NPO status , Patient's Chart, lab work & pertinent test results  Airway Mallampati: II TM Distance: >3 FB Neck ROM: Full    Dental No notable dental hx.    Pulmonary neg pulmonary ROS, asthma , former smoker clear to auscultation  Pulmonary exam normal       Cardiovascular hypertension, Pt. on medications neg cardio ROS - dysrhythmias Regular Normal    Neuro/Psych Negative Neurological ROS  Negative Psych ROS   GI/Hepatic negative GI ROS, Neg liver ROS,   Endo/Other  Negative Endocrine ROSMorbid obesity  Renal/GU negative Renal ROS  Genitourinary negative   Musculoskeletal negative musculoskeletal ROS (+) Fibromyalgia -  Abdominal   Peds negative pediatric ROS (+)  Hematology negative hematology ROS (+)   Anesthesia Other Findings   Reproductive/Obstetrics negative OB ROS                          Anesthesia Physical Anesthesia Plan  ASA: III  Anesthesia Plan: General   Post-op Pain Management:    Induction: Intravenous  Airway Management Planned:   Additional Equipment:   Intra-op Plan:   Post-operative Plan: Extubation in OR  Informed Consent: I have reviewed the patients History and Physical, chart, labs and discussed the procedure including the risks, benefits and alternatives for the proposed anesthesia with the patient or authorized representative who has indicated his/her understanding and acceptance.   Dental advisory given  Plan Discussed with: CRNA  Anesthesia Plan Comments:         Anesthesia Quick Evaluation

## 2012-01-03 NOTE — Transfer of Care (Signed)
Immediate Anesthesia Transfer of Care Note  Patient: Claire [name]  Procedure(s) Performed:  LAPAROSCOPIC ROUX-EN-Y GASTRIC  Patient Location: PACU  Anesthesia Type: General  Level of Consciousness: awake, oriented and patient cooperative  Airway & Oxygen Therapy: Patient Spontanous Breathing and Patient connected to face mask oxygen  Post-op Assessment: Report given to PACU RN, Post -op Vital signs reviewed and stable and Patient moving all extremities  Post vital signs: Reviewed and stable  Complications: No apparent anesthesia complications

## 2012-01-03 NOTE — Progress Notes (Signed)
Notified MD of BP elevated all day, orders received for Lopressor 10mg  IV every 6 hours. Means, Amrie Gurganus N 01-03-12 19:31pm

## 2012-01-03 NOTE — Op Note (Signed)
Preop diagnosis: Morbid obesity  Postop diagnosis: Morbid obesity   Surgical procedure: Laparoscopic Roux-en-Y gastric bypass  Surgeon: Sharlet Salina T.Bobbyjo Marulanda M.D.  Asst.: Ovidio Kin M.D.  Anesthesia: General  Complications:  None  EBL: Minimal  Drains: None  Disposition: PACU in good condition  Description of procedure: Patient is brought to the operating room and general anesthesia induced. She had received preoperative broad-spectrum IV antibiotics and subcutaneous heparin. The abdomen was widely sterilely prepped and draped. Patient timeout was performed and correct patient and procedure confirmed. Access was obtained with a 12 mm Optiview trocar in the left upper quadrant and pneumoperitoneum established without difficulty. Under direct vision 12 mm trocars were placed laterally in the right upper quadrant, right upper quadrant midclavicular line, and to the left and above the umbilicus for the camera port. A 5 mm trocar was placed laterally in the left upper quadrant. The omentum was brought into the upper abdomen and the transverse mesocolon elevated and the ligament of Treitz clearly identified. A 40 cm biliopancreatic limb was then carefully measured from the ligament of Treitz. The small intestine was divided at this point with a single firing of the white load linear stapler. A Penrose drain was sutured to the end of the Roux-en-Y limb for later identification. A 100 cm Roux-en-Y limb was then carefully measured. At this point a side-to-side anastomosis was created between the Roux limb and the end of the biliopancreatic limb. This was accomplished with a single firing of the 45 mm white load linear stapler. The common enterotomy was closed with a running 2-0 Vicryl begun at either end of the enterotomy and tied centrally. The mesenteric defect was then closed with running 2-0 silk. The omentum was then divided with the harmonic scalpel up towards the transverse colon to allow mobility  of the Roux limb toward the gastric pouch. The patient was then placed in steep reversed Trendelenburg. Through a 5 mm subxiphoid site the [facility] retractor was placed and the left lobe of the liver elevated with excellent exposure of the upper stomach and hiatus. The angle of Hiss was then mobilized with the harmonic scalpel. A 4 cm gastric pouch was then carefully measured along the lesser curve of the stomach. Dissection was carried along the lesser curve at this point with the Harmonic scalpel working carefully back toward the lesser sac at right angles to the lesser curve. The free lesser sac was then entered. After being sure all tubes were removed from the stomach an initial firing of the gold load 60 mm linear stapler was fired at right angles across the lesser curve for about 4 cm. The gastric pouch was further mobilized posteriorly and then the pouch was completed with 2 further firings of the 60 mm blue load linear stapler up through the previously dissected angle of His. It was ensured that the pouch was completely mobilized away from the gastric remnant. This created a nice tubular 4-5 cm gastric pouch. The staple line of the gastric remnant was then oversewn with 2-0 silk for hemostasis. The Roux limb was then brought up in an antecolic fashion with the candycane facing to the patient's left without undue tension. The gastrojejunostomy was created with an initial posterior row of 2-0 Vicryl between the Roux limb and the staple line of the gastric pouch. Enterotomies were then made in the gastric pouch and the Roux limb with the harmonic scalpel and at approximately 2-2-1/2 cm anastomosis was created with a single firing of the blue load linear  stapler. The staple line was inspected and was intact without bleeding. The common enterotomy was then closed with running 2-0 Vicryl begun at either end and tied centrally. The wall tube was then easily passed through the anastomosis and an outer anterior  layer of running 2-0 Vicryl was placed. The Ewald tube was removed. With the outlet of the gastrojejunostomy clamped and under saline irrigation the assistant performed upper endoscopy and with the gastric pouch tensely distended with air there was no evidence of leak. The pouch was desufflated. The Vonita Moss defect was closed with running 2-0 silk. The abdomen was inspected for any evidence of bleeding or bowel injury and everything looked fine. The Nathanson retractor was removed under direct vision after coating the anastomosis with Tisseel tissue sealant.  Bilateral On-Q 10" catheters were placed in the preperitoneal space under laparoscopic vision.  All CO2 was evacuated and trochars removed. Skin incisions were closed with staples. Sponge needle and instrument counts were correct. The patient was taken to the PACU in good condition.     Mariella Saa MD, FACS  01/03/2012, 10:43 AM

## 2012-01-03 NOTE — Anesthesia Postprocedure Evaluation (Signed)
  Anesthesia Post-op Note  Patient: Claire [name]  Procedure(s) Performed:  LAPAROSCOPIC ROUX-EN-Y GASTRIC  Patient Location: PACU  Anesthesia Type: General  Level of Consciousness: awake and alert   Airway and Oxygen Therapy: Patient Spontanous Breathing  Post-op Pain: mild  Post-op Assessment: Post-op Vital signs reviewed, Patient's Cardiovascular Status Stable, Respiratory Function Stable, Patent Airway and No signs of Nausea or vomiting  Post-op Vital Signs: stable  Complications: No apparent anesthesia complications

## 2012-01-03 NOTE — Progress Notes (Signed)
Paged Hoxworth concerning that patient is still complaining of nausea and given zofran with no relief, patients family wondering if she can get phenergan Means, Brittnie Lewey N 01-03-12 15:25pm

## 2012-01-03 NOTE — Progress Notes (Signed)
Patient states compliant with bowel prep as directed by doctor 
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
## 2012-01-04 ENCOUNTER — Inpatient Hospital Stay (HOSPITAL_COMMUNITY): Payer: BC Managed Care – PPO

## 2012-01-04 DIAGNOSIS — Z09 Encounter for follow-up examination after completed treatment for conditions other than malignant neoplasm: Secondary | ICD-10-CM

## 2012-01-04 LAB — CBC
HCT: 45.5 % (ref 36.0–46.0)
Hemoglobin: 15.3 g/dL — ABNORMAL HIGH (ref 12.0–15.0)
RBC: 5.2 MIL/uL — ABNORMAL HIGH (ref 3.87–5.11)
WBC: 12.2 10*3/uL — ABNORMAL HIGH (ref 4.0–10.5)

## 2012-01-04 LAB — HEMOGLOBIN AND HEMATOCRIT, BLOOD: HCT: 47.8 % — ABNORMAL HIGH (ref 36.0–46.0)

## 2012-01-04 LAB — DIFFERENTIAL
Lymphocytes Relative: 18 % (ref 12–46)
Monocytes Absolute: 0.8 10*3/uL (ref 0.1–1.0)
Monocytes Relative: 6 % (ref 3–12)
Neutro Abs: 9.2 10*3/uL — ABNORMAL HIGH (ref 1.7–7.7)
Neutrophils Relative %: 75 % (ref 43–77)

## 2012-01-04 MED ORDER — SUMATRIPTAN SUCCINATE 6 MG/0.5ML ~~LOC~~ SOLN
6.0000 mg | Freq: Once | SUBCUTANEOUS | Status: AC
  Administered 2012-01-04: 6 mg via SUBCUTANEOUS
  Filled 2012-01-04: qty 0.5

## 2012-01-04 MED ORDER — IOHEXOL 300 MG/ML  SOLN
50.0000 mL | Freq: Once | INTRAMUSCULAR | Status: AC | PRN
  Administered 2012-01-04: 50 mL via ORAL

## 2012-01-04 MED ORDER — DIPHENHYDRAMINE HCL 50 MG/ML IJ SOLN
12.5000 mg | Freq: Four times a day (QID) | INTRAMUSCULAR | Status: DC | PRN
  Administered 2012-01-04 – 2012-01-06 (×7): 12.5 mg via INTRAVENOUS
  Filled 2012-01-04 (×7): qty 1

## 2012-01-04 NOTE — Progress Notes (Signed)
Bilateral:  No evidence of DVT, superficial thrombosis, or Baker's Cyst.   Milta Deiters, [place] D.,RVS

## 2012-01-04 NOTE — Progress Notes (Signed)
Patient ID: Claire [name], female   DOB: 07/18/1986, 26 y.o.   MRN: [id number] 1 Day Post-Op  Subjective: Has migraine HA and nausea without vomiting.  Denies significant abdominal pain  Objective: Vital signs in last 24 hours: Temp:  [97.8 F (36.6 C)-98.4 F (36.9 C)] 97.8 F (36.6 C) (02/05 0625) Pulse Rate:  [74-110] 74  (02/05 0625) Resp:  [10-20] 20  (02/05 0625) BP: (108-173)/(70-112) 132/70 mmHg (02/05 0625) SpO2:  [93 %-100 %] 97 % (02/05 0625) Weight:  [306 lb (138.801 kg)] 306 lb (138.801 kg) (02/04 1239) Last BM Date: 01/03/12  Intake/Output from previous day: 02/04 0701 - 02/05 0700 In: 3611 [I.V.:3611] Out: 2040 [Urine:2040] Intake/Output this shift:    General appearance: alert and no distress Resp: clear to auscultation bilaterally GI: normal findings: soft, non-tender Incision/Wound:Clean, bruising/ecchymosis around left mid abd wound  Lab Results:   Basename 01/04/12 0400 01/03/12 1310  WBC 12.2* --  HGB 15.3* 15.2*  HCT 45.5 45.1  PLT 270 --   BMET No results found for this basename: NA:2,K:2,CL:2,CO2:2,GLUCOSE:2,BUN:2,CREATININE:2,CALCIUM:2 in the last 72 hours PT/INR No results found for this basename: LABPROT:2,INR:2 in the last 72 hours ABG No results found for this basename: PHART:2,PCO2:2,PO2:2,HCO3:2 in the last 72 hours  Studies/Results: No results found.  Anti-infectives: Anti-infectives     Start     Dose/Rate Route Frequency Ordered Stop   01/03/12 0700   cefOXitin (MEFOXIN) 2 g in dextrose 5 % 50 mL IVPB  Status:  Discontinued        2 g 100 mL/hr over 30 Minutes Intravenous 60 min pre-op 01/03/12 0533 01/03/12 1210          Assessment/Plan: s/p Procedure(s): LAPAROSCOPIC ROUX-EN-Y GASTRIC Stable post op, abdomen seems benign HA, will order Imitrex injection if available For swallow study today   LOS: 1 day    Bert Ptacek T 01/04/2012

## 2012-01-04 NOTE — Op Note (Signed)
NAMEQUIERRA, Claire [name] NO.:  [id number]  MEDICAL RECORD NO.:  [id number]  LOCATION:  1523                         FACILITY:  [facility]  PHYSICIAN:  Sandria Bales. Ezzard Standing, M.D.  DATE OF BIRTH:  Jan 27, 1986  DATE OF PROCEDURE:  01/03/2012                              OPERATIVE REPORT  PREOPERATIVE DIAGNOSIS:  Morbid obesity, status post laparoscopic Roux- en-Y gastric bypass.  POSTOPERATIVE DIAGNOSIS:  Morbid obesity, status post laparoscopic Roux- en-Y gastric bypass.  PROCEDURE:  Esophagogastroduodenoscopy.  SURGEON:  Sandria Bales. Ezzard Standing, M.D.  FIRST ASSISTANT:  None.  ANESTHESIA:  General endotracheal.  ESTIMATED BLOOD LOSS:  None.  INDICATION FOR PROCEDURE:  [name] is a 26 year old white female who is a patient of Dr. Glenna Fellows, and has just completed a laparoscopic Roux-en-Y gastric bypass.  Dr. Johna Sheriff manned the laparoscope and I did the upper endoscopy to evaluate the gastric pouch both for viability and leak.  PROCEDURE NOTE:  The patient was placed in supine position.  Under general anesthesia, I passed the Olympus flexible endoscope down the back of her throat into the gastric pouch.  I identified the esophagogastric junction at 40 cm.  I identified the gastrojejunal anastomosis at 44 cm with 4 cm pouch.  There was no active bleeding. The mucosa of the pouch looked viable.  Dr. Johna Sheriff clamped off the jejunal limb, flooded the upper abdomen with saline, while I pumped air into the stomach.  There was no evidence of leak or air bubbling.  I then withdrew the scope.  The esophagus that I could see was Unremarkable.  So this was a normal upper endoscopy and post Roux-en-Y gastric bypass.  The patient tolerated the procedure well.  Dr. Johna Sheriff will dictate the remainder of the operation.   Sandria Bales. Ezzard Standing, M.D., FACS   DHN/MEDQ  D:  01/03/2012  T:  01/04/2012  Job:  [id number]  cc:   Lorne Skeens. Hoxworth, M.D. [address] [place] [address]

## 2012-01-04 NOTE — Progress Notes (Signed)
Pt alert and oriented; VSS; pt c/o migraine headache; Imitrex ordered; Doppler study WNL; awaiting UGI; remains NPO; c/o nausea associated with migraine; denies vomiting; denies flatus or BM; voiding without difficulty; ambulating without difficulty; c/o abdominal soreness and relief with prn pain meds; discharge instructions given to pt to review later today once migraine has resolved and will review tomorrow.  Continue current plan of care.  GASTRIC BYPASS/SLEEVE DISCHARGE INSTRUCTIONS  Drs. Fredrik Rigger, Hoxworth, Wilson, and Gilby Call if you have any problems.   Call [phone] and ask for the surgeon on call.    If you need immediate assistance come to the ER at [facility]. Tell the ER personnel that you are a new post-op gastric bypass patient. Signs and symptoms to report:   Severe vomiting or nausea. If you cannot tolerate clear liquids for longer than 1 day, you need to call your surgeon.    Abdominal pain which does not get better after taking your pain medication   Fever greater than 101 F degree   Difficulty breathing   Chest pain    Redness, swelling, drainage, or foul odor at incision sites    If your incisions open or pull apart   Swelling or pain in calf (lower leg)   Diarrhea, frequent watery, uncontrolled bowel movements.   Constipation, (no bowel movements for 3 days) if this occurs, Take Milk of Magnesia, 2 tablespoons by mouth, 3 times a day for 2 days if needed.  Call your doctor if constipation continues. Stop taking Milk of Magnesia once you have had a bowel movement. You may also use Miralax according to the label instructions.   Anything you consider "abnormal for you".   Normal side effects after Surgery:   Unable to sleep at night or concentrate   Irritability   Being tearful (crying) or depressed   These are common complaints, possibly related to your anesthesia, stress of surgery and change in lifestyle, that usually go away a few weeks after surgery.   If these feelings continue, call your medical doctor.  Wound Care You may have surgical glue, steri-strips, or staples over your incisions after surgery.  Surgical glue:  Looks like a clear film over your incisions and will wear off gradually. Steri-strips: Strips of tape over your incisions. You may notice a yellowish color on the skin underneath the steri-strips. This is a substance used to make the steri-strips stick better. Do not pull the steri-strips off - let them fall off.  Staples: Cherlynn Polo may be removed before you leave the hospital. If you go home with staples, call [facility] [phone]) for an appointment with your surgeon's nurse to have staples removed in 7 - 10 days. Showering: You may shower two days after your surgery unless otherwise instructed by your surgeon. Wash gently around wounds with warm soapy water, rinse well, and gently pat dry.  If you have a drain, you may need someone to hold this while you shower. Avoid tub baths until staples are removed and incisions are healed.    Medications   Medications should be liquid or crushed if larger than the size of a dime.  Extended release pills should not be crushed.   Depending on the size and number of medications you take, you may need to stagger/change the time you take your medications so that you do not over-fill your pouch.    Make sure you follow-up with your primary care physician to make medication adjustments needed during rapid weight loss  and life-style adjustment.   If you are diabetic, follow up with the doctor that prescribes your diabetes medication(s) within one week after surgery and check your blood sugar regularly.   Do not drive while taking narcotics!   Do not take acetaminophen (Tylenol) and Roxicet or Lortab Elixir at the same time since these pain medications contain acetaminophen.  Diet at home: (First 2 Weeks) You will see the nutritionist two weeks after your surgery. She will advance  your diet if you are tolerating liquids well. Once at home, if you have severe vomiting or nausea and cannot tolerate clear liquids lasting longer than 1 day, call your surgeon.  Begin high protein shake 2 ounces every 3 hours, 5 - 6 times per day.  Gradually increase the amount you drink as tolerated.  You may find it easier to slowly sip shakes throughout the day.  It is important to get your proteins in first.   Protein Shake   Drink at least 2 ounces of shake 5-6 times per day   Each serving of protein shakes should have a minimum of 15 grams of protein and no more than 5 grams of carbohydrate    Increase the amount of protein shake you drink as tolerated   Protein powder may be added to fluids such as non-fat milk or Lactaid milk (limit to 20 grams added protein powder per serving   The initial goal is to drink at least 8 ounces of protein shake/drink per day (or as directed by the nutritionist). Some examples of protein shakes are [employer], [employer], EAS Edge HP, and Unjury. Hydration   Gradually increase the amount of water and other liquids as tolerated (See Acceptable Fluids)   Gradually increase the amount of protein shake as tolerated     Sip fluids slowly and throughout the day   May use Sugar substitutes, use sparingly (limit to 6 - 8 packets per day). Your fluid goal is 64 ounces of fluid daily. It may take a few weeks to build up to this.         32 oz (or more) should be clear liquids and 32 oz (or more) should be full liquids.         Liquids should not contain sugar, caffeine, or carbonation! Acceptable Fluids Clear Liquids:   Water or Sugar-free flavored water, Fruit H2O   Decaffeinated coffee or tea (sugar-free)   Crystal Lite, Wyler's Lite, Minute Maid Lite   Sugar-free Jell-O   Bouillon or broth   Sugar-free Popsicle:   *Less than 20 calories each; Limit 1 per day   Full Liquids:              Protein Shakes/Drinks + 2 choices per day of other full  liquids shown below.    Other full liquids must be: No more than 12 grams of Carbs per serving,  No more than 3 grams of Fat per serving   Strained low-fat cream soup   Non-Fat milk   Fat-free Lactaid Milk   Sugar-free yogurt (Dannon Lite & Fit) Vitamins and Minerals (Start 1 day after surgery unless otherwise directed)   2 Chewable Multivitamin / Multimineral Supplement (i.e. Centrum for Adults)   Chewable Calcium Citrate with Vitamin D-3. Take 1500 mg each day.           (Example: 3 Chewable Calcium Plus 600 with Vitamin D-3 can be found at [facility])         Vitamin B-12, 350 - 500  micrograms (oral tablet) each day   Do not mix multivitamins containing iron with calcium supplements; take 2 hours   apart   Do not substitute Tums (calcium carbonate) for your calcium   Menstruating women and those at risk for anemia may need extra iron. Talk with your doctor to see if you need additional iron.    If you need extra iron:  Total daily Iron recommendations (including Vitamins) = 50 - 100 mg Iron/day Do not stop taking or change any vitamins or minerals until you talk to your nutritionist or surgeon. Your nutritionist and / or physician must approve all vitamin and mineral supplements. Exercise For maximum success, begin exercising as soon as your doctor recommends. Make sure your physician approves any physical activity.   Depending on fitness level, begin with a simple walking program   Walk 5-15 minutes each day, 7 days per week.    Slowly increase until you are walking 30-45 minutes per day   Consider joining our BELT program. [phone] or email belt@[employer] .edu Things to remember:    You may have sexual relations when you feel comfortable. It is VERY important for female patients to use a reliable birth control method. Fertility often increases after surgery. Do not get pregnant for at least 18 months.   It is very important to keep all follow up appointments with your surgeon, nutritionist,  primary care physician, and behavioral health practitioner. After the first year, please follow up with your bariatric surgeon at least once a year in order to maintain best weight loss results.  [facility]: [phone] [facility]: [phone]   Free counseling is available for you and your family through collaboration between [facility] and [facility]. Please call [phone] and leave a message.    Consider purchasing a medical alert bracelet that says you had gastric bypass surgery.    [facility] has a free Bariatric Surgery Support Group that meets monthly, the 3rd Thursday, 6 pm, Classroom #1, [place]. You may register online at [email or web address], but registration is not necessary. Select Classes and Support Groups, Bariatric Surgery, or Call [phone]   Do not return to work or drive until cleared by your surgeon   Use your CPAP when sleeping if applicable   Do not lift anything greater than ten pounds for at least two weeks  Talmadge Chad, RN Bariatric Nurse Coordinator

## 2012-01-04 NOTE — Progress Notes (Signed)
2 RNs looked to attempt to restart IV on pt, unsuccessful in attempt to find vein to use for restart . IV team was notified and IV Team Rn was unable to restart IV. Charge RN called OR to attempt to notify CRNA to attempt to restart IV, also notified Radiology to se if there was available to restart IV. Marcelino Duster, RN

## 2012-01-04 NOTE — Progress Notes (Signed)
Dr. Johna Sheriff notified of UGI results; orders received to advance to POD #1 diet-2 ounces of water PO every 4 hours; sip slowly over 1 hour. May have ice chips as part of total fluid volume. Instruct patient to allow ice chips to melt before swallowing. Shanda Bumps, RN notified of results and orders for advancing diet. Talmadge Chad, RN Bariatric Nurse coordinator

## 2012-01-05 ENCOUNTER — Encounter (HOSPITAL_COMMUNITY): Payer: Self-pay | Admitting: General Surgery

## 2012-01-05 LAB — CBC
HCT: 45.5 % (ref 36.0–46.0)
MCV: 88.5 fL (ref 78.0–100.0)
Platelets: 212 10*3/uL (ref 150–400)
RBC: 5.14 MIL/uL — ABNORMAL HIGH (ref 3.87–5.11)
RDW: 12.5 % (ref 11.5–15.5)
WBC: 8.1 10*3/uL (ref 4.0–10.5)

## 2012-01-05 LAB — DIFFERENTIAL
Basophils Absolute: 0 10*3/uL (ref 0.0–0.1)
Eosinophils Relative: 2 % (ref 0–5)
Lymphocytes Relative: 35 % (ref 12–46)
Lymphs Abs: 2.8 10*3/uL (ref 0.7–4.0)
Monocytes Absolute: 0.6 10*3/uL (ref 0.1–1.0)
Neutro Abs: 4.5 10*3/uL (ref 1.7–7.7)

## 2012-01-05 MED ORDER — PREGABALIN 75 MG PO CAPS
150.0000 mg | ORAL_CAPSULE | Freq: Three times a day (TID) | ORAL | Status: DC
  Administered 2012-01-05 (×2): 150 mg via ORAL
  Filled 2012-01-05 (×2): qty 1
  Filled 2012-01-05: qty 2

## 2012-01-05 MED ORDER — HYDROCHLOROTHIAZIDE 25 MG PO TABS
25.0000 mg | ORAL_TABLET | Freq: Every day | ORAL | Status: DC
  Filled 2012-01-05: qty 1

## 2012-01-05 MED ORDER — DULOXETINE HCL 60 MG PO CPEP
90.0000 mg | ORAL_CAPSULE | Freq: Every day | ORAL | Status: DC
  Administered 2012-01-05: 90 mg via ORAL
  Filled 2012-01-05 (×2): qty 1

## 2012-01-05 MED ORDER — DRONABINOL 2.5 MG PO CAPS: 5.0000 mg | ORAL_CAPSULE | Freq: Two times a day (BID) | ORAL | Status: DC | PRN

## 2012-01-05 MED ORDER — PANTOPRAZOLE SODIUM 40 MG PO TBEC
40.0000 mg | DELAYED_RELEASE_TABLET | Freq: Every day | ORAL | Status: DC
  Administered 2012-01-05: 40 mg via ORAL
  Filled 2012-01-05 (×2): qty 1

## 2012-01-05 MED ORDER — SUMATRIPTAN SUCCINATE 100 MG PO TABS
100.0000 mg | ORAL_TABLET | ORAL | Status: DC | PRN
  Administered 2012-01-05: 100 mg via ORAL
  Filled 2012-01-05: qty 1

## 2012-01-05 MED ORDER — PREGABALIN 75 MG PO CAPS: 150.0000 mg | ORAL_CAPSULE | Freq: Three times a day (TID) | ORAL | Status: DC

## 2012-01-05 MED ORDER — CLONAZEPAM 1 MG PO TABS
1.0000 mg | ORAL_TABLET | Freq: Two times a day (BID) | ORAL | Status: DC | PRN
  Administered 2012-01-05: 1 mg via ORAL
  Filled 2012-01-05: qty 1

## 2012-01-05 MED ORDER — BUPROPION HCL ER (XL) 300 MG PO TB24
300.0000 mg | ORAL_TABLET | Freq: Every day | ORAL | Status: DC
  Administered 2012-01-05: 300 mg via ORAL
  Filled 2012-01-05 (×2): qty 1

## 2012-01-05 MED ORDER — ARIPIPRAZOLE 10 MG PO TABS
10.0000 mg | ORAL_TABLET | Freq: Every day | ORAL | Status: DC
  Administered 2012-01-05: 10 mg via ORAL
  Filled 2012-01-05 (×2): qty 1

## 2012-01-05 MED ORDER — TIZANIDINE HCL 4 MG PO TABS
4.0000 mg | ORAL_TABLET | Freq: Three times a day (TID) | ORAL | Status: DC | PRN
  Filled 2012-01-05: qty 2

## 2012-01-05 NOTE — Progress Notes (Signed)
Patient ID: Claire [name], female   DOB: [birth year], 26 y.o.   MRN: [id number] 2 Days Post-Op  Subjective: Having some nausea with protein shakes. No vomiting. She had a small bowel movement. Abdominal pain is mild. She has been up and walking. Headaches are resolved after Imitrex.  Objective: Vital signs in last 24 hours: Temp:  [97.8 F (36.6 C)-98.5 F (36.9 C)] 98.5 F (36.9 C) (02/06 0600) Pulse Rate:  [75-94] 79  (02/06 0600) Resp:  [18-20] 20  (02/06 0600) BP: (138-174)/(78-113) 148/96 mmHg (02/06 0600) SpO2:  [92 %-97 %] 93 % (02/06 0829) Last BM Date: 01/03/12  Intake/Output from previous day: 02/05 0701 - 02/06 0700 In: 2197.3 [P.O.:120; I.V.:2077.3] Out: 1450 [Urine:1450] Intake/Output this shift:    General appearance: alert and no distress Resp: clear to auscultation bilaterally GI: normal findings: soft, non-tender Incision/Wound:clean and dry without evidence of infection  Lab Results:   Basename 01/05/12 0325 01/04/12 1817 01/04/12 0400  WBC 8.1 -- 12.2*  HGB 14.9 16.1* --  HCT 45.5 47.8* --  PLT 212 -- 270   BMET No results found for this basename: NA:2,K:2,CL:2,CO2:2,GLUCOSE:2,BUN:2,CREATININE:2,CALCIUM:2 in the last 72 hours PT/INR No results found for this basename: LABPROT:2,INR:2 in the last 72 hours ABG No results found for this basename: PHART:2,PCO2:2,PO2:2,HCO3:2 in the last 72 hours  Studies/Results: Dg Ugi W/water Sol Cm  01/04/2012  *RADIOLOGY REPORT*  Clinical Data:  Status post gastric bypass yesterday.  UPPER GI SERIES WITH KUB  Technique: Limited upper GI series was performed with 50 ml Omnipaque-300 orally per protocol.  Fluoroscopy Time: 0.37 minutes  Comparison:  None.  Findings: The scout abdominal radiograph demonstrates postsurgical changes in the left upper quadrant.  There are additional bowel anastomosis clips in the pelvis.  Patient is status post cholecystectomy and thoracolumbar fusion.  Intrauterine device is noted.  The  esophageal motility is normal.  Contrast flows freely into the gastric pouch.  There is rapid drainage from the pelvis into the small bowel.  No extravasation is identified.  The distal anastomosis is not clearly visualized.  IMPRESSION: No demonstrated complication following gastric bypass.  Original Report Authenticated By: Gerrianne Scale, M.D.    Anti-infectives: Anti-infectives     Start     Dose/Rate Route Frequency Ordered Stop   01/03/12 0700   cefOXitin (MEFOXIN) 2 g in dextrose 5 % 50 mL IVPB  Status:  Discontinued        2 g 100 mL/hr over 30 Minutes Intravenous 60 min pre-op 01/03/12 0533 01/03/12 1210          Assessment/Plan: s/p Procedure(s): LAPAROSCOPIC ROUX-EN-Y GASTRIC Overall doing well but not yet tolerating by mouth adequately do to nausea. Will observe today. Restart home medications.   LOS: 2 days    Jeilyn Reznik T 01/05/2012

## 2012-01-05 NOTE — Progress Notes (Signed)
Pt advanced to POD #2 diet ( 2 ounces of bariatric shake); attempted to consume bariatric shake at 10:00. Pt c/o nausea and was unable to tolerate shake. Pt given Zofran 4 mg via IV and given another 2 ounces of bariatric shake approx 14:30. Pt stated that she was still " a little nauseated" but did drink the entire 2 ounces. Marcelino Duster, RN

## 2012-01-05 NOTE — Progress Notes (Signed)
Pt alert and oriented; a little tearful this am and states she is ready to go home;states migraine gone today;  tolerating water well; will advance to POD #2 diet; denies nausea or vomiting; denies passing flatus; denies BM; voiding well; ambulating without difficulty; pt already has follow up appts with [facility] and [facility]; discharge instructions reviewed and pt verbalized understanding of. Pt aware of Support group and BELT programs. Awaiting to see MD.  GASTRIC BYPASS/SLEEVE DISCHARGE INSTRUCTIONS  Drs. Fredrik Rigger, Hoxworth, Wilson, and Branch Call if you have any problems.   Call [phone] and ask for the surgeon on call.    If you need immediate assistance come to the ER at [facility]. Tell the ER personnel that you are a new post-op gastric bypass patient. Signs and symptoms to report:   Severe vomiting or nausea. If you cannot tolerate clear liquids for longer than 1 day, you need to call your surgeon.    Abdominal pain which does not get better after taking your pain medication   Fever greater than 101 F degree   Difficulty breathing   Chest pain    Redness, swelling, drainage, or foul odor at incision sites    If your incisions open or pull apart   Swelling or pain in calf (lower leg)   Diarrhea, frequent watery, uncontrolled bowel movements.   Constipation, (no bowel movements for 3 days) if this occurs, Take Milk of Magnesia, 2 tablespoons by mouth, 3 times a day for 2 days if needed.  Call your doctor if constipation continues. Stop taking Milk of Magnesia once you have had a bowel movement. You may also use Miralax according to the label instructions.   Anything you consider "abnormal for you".   Normal side effects after Surgery:   Unable to sleep at night or concentrate   Irritability   Being tearful (crying) or depressed   These are common complaints, possibly related to your anesthesia, stress of surgery and change in lifestyle, that usually go away a few weeks after  surgery.  If these feelings continue, call your medical doctor.  Wound Care You may have surgical glue, steri-strips, or staples over your incisions after surgery.  Surgical glue:  Looks like a clear film over your incisions and will wear off gradually. Steri-strips: Strips of tape over your incisions. You may notice a yellowish color on the skin underneath the steri-strips. This is a substance used to make the steri-strips stick better. Do not pull the steri-strips off - let them fall off.  Staples: Cherlynn Polo may be removed before you leave the hospital. If you go home with staples, call [facility] [phone]) for an appointment with your surgeon's nurse to have staples removed in 7 - 10 days. Showering: You may shower two days after your surgery unless otherwise instructed by your surgeon. Wash gently around wounds with warm soapy water, rinse well, and gently pat dry.  If you have a drain, you may need someone to hold this while you shower. Avoid tub baths until staples are removed and incisions are healed.    Medications   Medications should be liquid or crushed if larger than the size of a dime.  Extended release pills should not be crushed.   Depending on the size and number of medications you take, you may need to stagger/change the time you take your medications so that you do not over-fill your pouch.    Make sure you follow-up with your primary care physician to make  medication adjustments needed during rapid weight loss and life-style adjustment.   If you are diabetic, follow up with the doctor that prescribes your diabetes medication(s) within one week after surgery and check your blood sugar regularly.   Do not drive while taking narcotics!   Do not take acetaminophen (Tylenol) and Roxicet or Lortab Elixir at the same time since these pain medications contain acetaminophen.  Diet at home: (First 2 Weeks) You will see the nutritionist two weeks after your surgery. She  will advance your diet if you are tolerating liquids well. Once at home, if you have severe vomiting or nausea and cannot tolerate clear liquids lasting longer than 1 day, call your surgeon.  Begin high protein shake 2 ounces every 3 hours, 5 - 6 times per day.  Gradually increase the amount you drink as tolerated.  You may find it easier to slowly sip shakes throughout the day.  It is important to get your proteins in first.   Protein Shake   Drink at least 2 ounces of shake 5-6 times per day   Each serving of protein shakes should have a minimum of 15 grams of protein and no more than 5 grams of carbohydrate    Increase the amount of protein shake you drink as tolerated   Protein powder may be added to fluids such as non-fat milk or Lactaid milk (limit to 20 grams added protein powder per serving   The initial goal is to drink at least 8 ounces of protein shake/drink per day (or as directed by the nutritionist). Some examples of protein shakes are [employer], [employer], EAS Edge HP, and [employer]jury. Hydration   Gradually increase the amount of water and other liquids as tolerated (See Acceptable Fluids)   Gradually increase the amount of protein shake as tolerated     Sip fluids slowly and throughout the day   May use Sugar substitutes, use sparingly (limit to 6 - 8 packets per day). Your fluid goal is 64 ounces of fluid daily. It may take a few weeks to build up to this.         32 oz (or more) should be clear liquids and 32 oz (or more) should be full liquids.         Liquids should not contain sugar, caffeine, or carbonation! Acceptable Fluids Clear Liquids:   Water or Sugar-free flavored water, Fruit H2O   Decaffeinated coffee or tea (sugar-free)   Crystal Lite, Wyler's Lite, Minute Maid Lite   Sugar-free Jell-O   Bouillon or broth   Sugar-free Popsicle:   *Less than 20 calories each; Limit 1 per day   Full Liquids:              Protein Shakes/Drinks + 2 choices per day of  other full liquids shown below.    Other full liquids must be: No more than 12 grams of Carbs per serving,  No more than 3 grams of Fat per serving   Strained low-fat cream soup   Non-Fat milk   Fat-free Lactaid Milk   Sugar-free yogurt (Dannon Lite & Fit) Vitamins and Minerals (Start 1 day after surgery unless otherwise directed)   2 Chewable Multivitamin / Multimineral Supplement (i.e. Centrum for Adults)   Chewable Calcium Citrate with Vitamin D-3. Take 1500 mg each day.           (Example: 3 Chewable Calcium Plus 600 with Vitamin D-3 can be found at [facility])  Vitamin B-12, 350 - 500 micrograms (oral tablet) each day   Do not mix multivitamins containing iron with calcium supplements; take 2 hours   apart   Do not substitute Tums (calcium carbonate) for your calcium   Menstruating women and those at risk for anemia may need extra iron. Talk with your doctor to see if you need additional iron.    If you need extra iron:  Total daily Iron recommendations (including Vitamins) = 50 - 100 mg Iron/day Do not stop taking or change any vitamins or minerals until you talk to your nutritionist or surgeon. Your nutritionist and / or physician must approve all vitamin and mineral supplements. Exercise For maximum success, begin exercising as soon as your doctor recommends. Make sure your physician approves any physical activity.   Depending on fitness level, begin with a simple walking program   Walk 5-15 minutes each day, 7 days per week.    Slowly increase until you are walking 30-45 minutes per day   Consider joining our BELT program. [phone] or email belt@[employer] .edu Things to remember:    You may have sexual relations when you feel comfortable. It is VERY important for female patients to use a reliable birth control method. Fertility often increases after surgery. Do not get pregnant for at least 18 months.   It is very important to keep all follow up appointments with your surgeon,  nutritionist, primary care physician, and behavioral health practitioner. After the first year, please follow up with your bariatric surgeon at least once a year in order to maintain best weight loss results.  [facility]: [phone] [facility]: [phone]   Free counseling is available for you and your family through collaboration between [facility] and [place]. Please call [phone] and leave a message.    Consider purchasing a medical alert bracelet that says you had gastric bypass surgery.    [facility] has a free Bariatric Surgery Support Group that meets monthly, the 3rd Thursday, 6 pm, Classroom #1, [place]. You may register online at [email or web address], but registration is not necessary. Select Classes and Support Groups, Bariatric Surgery, or Call [phone]   Do not return to work or drive until cleared by your surgeon   Use your CPAP when sleeping if applicable   Do not lift anything greater than ten pounds for at least two weeks  Talmadge Chad, RN  Bariatric Nurse Coordinator

## 2012-01-05 NOTE — Progress Notes (Signed)
Pt's IV site infiltrated and no longer able to be accessed. 2 RNs looked to find suitable vein to restart IV, were unsuccessful. IV team was notified, IV RN unable to restart IV. On-call CRNA was notified, stated that they would come to pt's room and attempt to restart IV. Darene Lamer Shanterria Franta

## 2012-01-06 MED ORDER — HYDROMORPHONE HCL 4 MG PO TABS: 2.0000 mg | ORAL_TABLET | Freq: Four times a day (QID) | ORAL | Status: AC | PRN

## 2012-01-06 MED ORDER — OXYCODONE-ACETAMINOPHEN 5-325 MG/5ML PO SOLN: 5.0000 mL | ORAL | Status: AC | PRN

## 2012-01-06 NOTE — Discharge Summary (Signed)
Patient ID: Claire [name] [id number] 25 y.o. September 23, 1986  01/03/2012  Discharge date and time: 01/06/2012  Admitting Physician: Glenna Fellows T  Discharge Physician: Glenna Fellows T  Admission Diagnoses: morbid obesity   Discharge Diagnoses: same  Operations: Procedure(s): LAPAROSCOPIC ROUX-EN-Y GASTRIC bypass  Admission Condition: good  Discharged Condition: good  Indication for Admission: patient is a 26 year old female with progressive morbid obesity unresponsive to medical management with a BMI of 45 and comorbidities including degenerative joint disease and chronic pain. After a thorough preoperative workup and discussion detailed elsewhere she is admitted electively for laparoscopic Roux-en-Y gastric bypass.  Hospital Course: the patient was admitted on the morning of her procedure. She underwent an uneventful laparoscopic gastric bypass. Her postoperative course was relatively smooth. She did have some issues with nausea postoperatively which is a chronic problem for her. She remained stable. Her Gastrografin swallow on the first postoperative day showed no evidence of leak or obstruction and she was started on fluids. On the second postoperative day she was having some nausea limiting her by mouth intake. Her white count was normal and hemoglobin was stable and abdomen exam was benign. By the second postoperative day her nausea was improved and she was tolerating protein shakes. Bowels were moving. She is afebrile with normal vital signs. She is felt ready for discharge.   Disposition: Home  Patient Instructions:   Claire, [name]  Home Medication Instructions WUJ:[id number]   Printed on:01/06/12 0857  Medication Information                    buPROPion (WELLBUTRIN XL) 300 MG 24 hr tablet Take 300 mg by mouth at bedtime.            ARIPiprazole (ABILIFY) 10 MG tablet Take 10 mg by mouth at bedtime.            omeprazole (PRILOSEC) 40 MG capsule Take 40  mg by mouth 2 (two) times daily.            metoCLOPramide (REGLAN) 10 MG tablet Take 10 mg by mouth 4 (four) times daily as needed. Nausea            SUMAtriptan (IMITREX) 100 MG tablet Take 100 mg by mouth every 2 (two) hours as needed. Headache            HYDROmorphone (DILAUDID) 4 MG tablet Take 4 mg by mouth every 6 (six) hours as needed. Pain            DULoxetine (CYMBALTA) 30 MG capsule Take 90 mg by mouth at bedtime.           pregabalin (LYRICA) 150 MG capsule Take 150 mg by mouth 3 (three) times daily.           dronabinol (MARINOL) 5 MG capsule Take 5 mg by mouth 2 (two) times daily as needed. Nausea            clonazePAM (KLONOPIN) 1 MG tablet Take 1 mg by mouth 2 (two) times daily as needed. Anxiety            tiZANidine (ZANAFLEX) 4 MG tablet Take 4-8 mg by mouth every 8 (eight) hours as needed. Muscle spasm            hydrochlorothiazide (HYDRODIURIL) 25 MG tablet Take 25 mg by mouth every morning.           albuterol (PROVENTIL HFA;VENTOLIN HFA) 108 (90 BASE) MCG/ACT inhaler Inhale 2 puffs into the lungs every 6 (  six) hours as needed. Wheezing            albuterol (PROVENTIL) (2.5 MG/3ML) 0.083% nebulizer solution Take 2.5 mg by nebulization every 6 (six) hours as needed. Wheezing            Cholecalciferol (VITAMIN D) 2000 UNITS tablet Take 2,000 Units by mouth daily.           calcium carbonate (OS-CAL - DOSED IN MG OF ELEMENTAL CALCIUM) 1250 MG tablet Take 1 tablet by mouth 2 (two) times daily.           Multiple Vitamin (MULITIVITAMIN WITH MINERALS) TABS Take 2 tablets by mouth daily.           budesonide-formoterol (SYMBICORT) 160-4.5 MCG/ACT inhaler Inhale 2 puffs into the lungs 2 (two) times daily.           oxyCODONE-acetaminophen (ROXICET) 5-325 MG/5ML solution Take 5-10 mLs by mouth every 4 (four) hours as needed.           HYDROmorphone (DILAUDID) 4 MG tablet Take 0.5-1 tablets (2-4 mg total) by mouth every 6 (six) hours as needed  for pain.             Activity: no heavy lifting for 4 weeks Diet: bariatric protein shake Wound Care: none needed  Follow-up:  With Dr. Johna Sheriff in 1 week.  Signed: Mariella Saa MD, FACS  01/06/2012, 8:57 AM

## 2012-01-06 NOTE — Progress Notes (Signed)
Patient ID: Claire [name], female   DOB: Apr 22, 1986, 26 y.o.   MRN: [id number] 3 Days Post-Op  Subjective: Feels better today. Still some nausea but much improved. Keeping protein shakes down. Had a bowel movement.  Objective: Vital signs in last 24 hours: Temp:  [98.1 F (36.7 C)-98.3 F (36.8 C)] 98.2 F (36.8 C) (02/07 0557) Pulse Rate:  [84-90] 86  (02/07 0557) Resp:  [18] 18  (02/07 0557) BP: (113-168)/(58-101) 113/58 mmHg (02/07 0557) SpO2:  [92 %-95 %] 92 % (02/07 0557) Last BM Date: 01/05/12  Intake/Output from previous day: 02/06 0701 - 02/07 0700 In: 1989.4 [I.V.:1989.4] Out: 1450 [Urine:1450] Intake/Output this shift: Total I/O In: -  Out: 100 [Urine:100]  General appearance: alert and no distress GI: normal findings: soft, non-tender Incision/Wound:clean without evidence of infection  Lab Results:   Basename 01/05/12 0325 01/04/12 1817 01/04/12 0400  WBC 8.1 -- 12.2*  HGB 14.9 16.1* --  HCT 45.5 47.8* --  PLT 212 -- 270   BMET No results found for this basename: NA:2,K:2,CL:2,CO2:2,GLUCOSE:2,BUN:2,CREATININE:2,CALCIUM:2 in the last 72 hours PT/INR No results found for this basename: LABPROT:2,INR:2 in the last 72 hours ABG No results found for this basename: PHART:2,PCO2:2,PO2:2,HCO3:2 in the last 72 hours  Studies/Results: Dg Ugi W/water Sol Cm  01/04/2012  *RADIOLOGY REPORT*  Clinical Data:  Status post gastric bypass yesterday.  UPPER GI SERIES WITH KUB  Technique: Limited upper GI series was performed with 50 ml Omnipaque-300 orally per protocol.  Fluoroscopy Time: 0.37 minutes  Comparison:  None.  Findings: The scout abdominal radiograph demonstrates postsurgical changes in the left upper quadrant.  There are additional bowel anastomosis clips in the pelvis.  Patient is status post cholecystectomy and thoracolumbar fusion.  Intrauterine device is noted.  The esophageal motility is normal.  Contrast flows freely into the gastric pouch.  There is rapid  drainage from the pelvis into the small bowel.  No extravasation is identified.  The distal anastomosis is not clearly visualized.  IMPRESSION: No demonstrated complication following gastric bypass.  Original Report Authenticated By: Gerrianne Scale, M.D.    Anti-infectives: Anti-infectives     Start     Dose/Rate Route Frequency Ordered Stop   01/03/12 0700   cefOXitin (MEFOXIN) 2 g in dextrose 5 % 50 mL IVPB  Status:  Discontinued        2 g 100 mL/hr over 30 Minutes Intravenous 60 min pre-op 01/03/12 0533 01/03/12 1210          Assessment/Plan: s/p Procedure(s): LAPAROSCOPIC ROUX-EN-Y GASTRIC Doing well today with no apparent complication. Okay for discharge. Followup in the office one week.   LOS: 3 days    Walker Sitar T 01/06/2012

## 2012-01-06 NOTE — Progress Notes (Signed)
Pt being wheeled out for discharge; has and verbalized understanding of discharge instructions; pt already has follow up appts with [facility] and [facility]; encouraged pt to contact me with any concerns or questions. Talmadge Chad, RN Bariatric Nurse Coordinator

## 2012-01-06 NOTE — Progress Notes (Signed)
Pt is alert and oriented, vital signs are stable, discharge instructions reviewed with patient and questions and concerns answered, prescriptions given, IV removed and on q pump removed also patient tolerating well, pt to follow up with bariatric MD Means, Loghan Kurtzman N 01-06-12 9:42am

## 2012-01-18 ENCOUNTER — Ambulatory Visit: Payer: BC Managed Care – PPO

## 2012-01-19 ENCOUNTER — Encounter (INDEPENDENT_AMBULATORY_CARE_PROVIDER_SITE_OTHER): Payer: Self-pay | Admitting: General Surgery

## 2012-01-19 ENCOUNTER — Ambulatory Visit (INDEPENDENT_AMBULATORY_CARE_PROVIDER_SITE_OTHER): Payer: BC Managed Care – PPO | Admitting: General Surgery

## 2012-01-19 VITALS — BP 132/86 | HR 102 | Temp 97.6°F | Resp 16 | Ht 70.0 in | Wt 293.6 lb

## 2012-01-19 DIAGNOSIS — Z09 Encounter for follow-up examination after completed treatment for conditions other than malignant neoplasm: Secondary | ICD-10-CM

## 2012-01-19 NOTE — Progress Notes (Signed)
Patient returns 3 weeks following laparoscopic Roux-en-Y gastric bypass. She started her transition to solid food about 5 days ago. She has been having some intermittent nausea and vomiting since then probably about every other day. She feels hungry but then gets nausea when she tries to eat. She not having any abdominal pain or fever. She does feel fatigued  Exam: BP 132/86  Pulse 102  Temp(Src) 97.6 F (36.4 C) (Temporal)  Resp 16  Ht 5\' 10"  (1.778 m)  Wt 293 lb 9.6 oz (133.176 kg)  BMI 42.13 kg/m2  LMP 12/28/2011  Gen.: Does not appear ill Abdomen: Soft and nontender. Wounds healing well.  Assessment plan: Stable post Roux-en-Y gastric bypass. I don't see any evidence of complication. After discussion she may be tried E. little fast or not she would well. She will go back to protein shakes for a few days and then cautiously resume solid food. She will call for any worsening symptoms otherwise he'll see her back in 3 weeks.

## 2012-02-18 ENCOUNTER — Ambulatory Visit (INDEPENDENT_AMBULATORY_CARE_PROVIDER_SITE_OTHER): Payer: BC Managed Care – PPO | Admitting: General Surgery

## 2012-03-03 ENCOUNTER — Ambulatory Visit (INDEPENDENT_AMBULATORY_CARE_PROVIDER_SITE_OTHER): Payer: BC Managed Care – PPO | Admitting: General Surgery

## 2012-03-03 ENCOUNTER — Encounter (INDEPENDENT_AMBULATORY_CARE_PROVIDER_SITE_OTHER): Payer: Self-pay | Admitting: General Surgery

## 2012-03-03 VITALS — BP 130/82 | HR 70 | Temp 97.8°F | Resp 18 | Ht 70.0 in | Wt 271.0 lb

## 2012-03-03 DIAGNOSIS — K912 Postsurgical malabsorption, not elsewhere classified: Secondary | ICD-10-CM

## 2012-03-03 NOTE — Progress Notes (Signed)
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 She continues to have persistent nausea and frequent vomiting. She and her mother state that her oral intake is really quite minimal and she becomes nauseated with small amounts of solids or liquids. She denies abdominal pain or fever. She has occasional vomiting. Of note is she did have chronic severe nausea preoperatively.  Exam: BP 130/82  Pulse 70  Temp(Src) 97.8 F (36.6 C) (Temporal)  Resp 18  Ht 5\' 10"  (1.778 m)  Wt 271 lb (122.925 kg)  BMI 38.88 kg/m2 Total weight loss 38 pounds Gen.: She does not appear ill or dehydrated Abdomen: Soft and nontender. Wounds well healed.  Assessment and plan: Persistent nausea and occasional vomiting post bypass with limited p.o. intake. I suspect this may be exacerbated by her preoperative issues but I believe we need to rule out complications such as ulcer or stenosis. We will proceed with upper endoscopy to rule this out. I am going to check chemistries today. She will concentrate on protein shakes as much as possible. I asked her to call for any worsening symptoms I will otherwise see her back in one month pending results of the above studies.

## 2012-03-06 ENCOUNTER — Encounter (HOSPITAL_COMMUNITY)
Admission: RE | Disposition: A | Discharge status: Home / Self Care | Payer: Self-pay | Source: Ambulatory Visit | Attending: Surgery

## 2012-03-06 ENCOUNTER — Encounter (HOSPITAL_COMMUNITY): Payer: Self-pay | Admitting: *Deleted

## 2012-03-06 ENCOUNTER — Ambulatory Visit (HOSPITAL_COMMUNITY)
Admission: RE | Admit: 2012-03-06 | Discharge: 2012-03-06 | Disposition: A | Discharge status: Home / Self Care | Payer: BC Managed Care – PPO | Source: Ambulatory Visit | Attending: Surgery | Admitting: Surgery

## 2012-03-06 DIAGNOSIS — G8929 Other chronic pain: Secondary | ICD-10-CM | POA: Insufficient documentation

## 2012-03-06 DIAGNOSIS — Z79899 Other long term (current) drug therapy: Secondary | ICD-10-CM | POA: Insufficient documentation

## 2012-03-06 DIAGNOSIS — R0602 Shortness of breath: Secondary | ICD-10-CM

## 2012-03-06 DIAGNOSIS — M549 Dorsalgia, unspecified: Secondary | ICD-10-CM | POA: Insufficient documentation

## 2012-03-06 DIAGNOSIS — R112 Nausea with vomiting, unspecified: Secondary | ICD-10-CM

## 2012-03-06 DIAGNOSIS — Z9884 Bariatric surgery status: Secondary | ICD-10-CM | POA: Insufficient documentation

## 2012-03-06 HISTORY — PX: ESOPHAGOGASTRODUODENOSCOPY: SHX5428

## 2012-03-06 SURGERY — EGD (ESOPHAGOGASTRODUODENOSCOPY)
Anesthesia: Moderate Sedation

## 2012-03-06 MED ORDER — MIDAZOLAM HCL 10 MG/2ML IJ SOLN
INTRAMUSCULAR | Status: DC | PRN
  Administered 2012-03-06: 3 mg via INTRAVENOUS
  Administered 2012-03-06 (×3): 1 mg via INTRAVENOUS

## 2012-03-06 MED ORDER — FENTANYL CITRATE 0.05 MG/ML IJ SOLN
INTRAMUSCULAR | Status: AC
  Filled 2012-03-06: qty 4

## 2012-03-06 MED ORDER — FENTANYL NICU IV SYRINGE 50 MCG/ML
INJECTION | INTRAMUSCULAR | Status: DC | PRN
  Administered 2012-03-06 (×4): 25 ug via INTRAVENOUS

## 2012-03-06 MED ORDER — MIDAZOLAM HCL 10 MG/2ML IJ SOLN
INTRAMUSCULAR | Status: AC
  Filled 2012-03-06: qty 4

## 2012-03-06 MED ORDER — BUTAMBEN-TETRACAINE-BENZOCAINE 2-2-14 % EX AERO
INHALATION_SPRAY | CUTANEOUS | Status: DC | PRN
  Administered 2012-03-06: 3 via TOPICAL

## 2012-03-06 MED ORDER — SODIUM CHLORIDE 0.9 % IV SOLN
Freq: Once | INTRAVENOUS | Status: AC
  Administered 2012-03-06: 500 mL via INTRAVENOUS

## 2012-03-06 MED ORDER — DIPHENHYDRAMINE HCL 50 MG/ML IJ SOLN
INTRAMUSCULAR | Status: AC
  Filled 2012-03-06: qty 1

## 2012-03-06 NOTE — Brief Op Note (Signed)
03/06/2012  10:05 AM  PATIENT:  Claire [name], 26 y.o., female, MRN: [id number]  PREOP DIAGNOSIS:  NAUSEA VOMITING  POSTOP DIAGNOSIS:   Nausea and vomiting with normal pouch/anastomosis.  (EG at 36-37 cm and GJ at 42-43 cm.)  PROCEDURE:   Procedure(s): ESOPHAGOGASTROjejunoscopy.  SURGEON:   Ovidio Kin, M.D.  ASSISTANT:   none  ANESTHESIA:   IV sedation  * No anesthesia staff entered *  Moderate Sedation  EBL:  none  ml  BLOOD ADMINISTERED: none  DRAINS: none   LOCAL MEDICATIONS USED:   none  SPECIMEN:   none  COUNTS CORRECT:  YES  INDICATIONS FOR PROCEDURE:  Claire [name] is a 26 y.o. (DOB: 04-14-1986) white female whose primary care physician is [facility], MD, MD and comes for EGJ post RYGB by Dr. Leonard Schwartz. Hoxworth on 01/03/2012.   The indications and risks of the surgery were explained to the patient.  The risks include, but are not limited to, infection, bleeding, and nerve injury.  Note dictated to:   [phone]

## 2012-03-06 NOTE — Interval H&P Note (Signed)
History and Physical Interval Note:  03/06/2012 9:27 AM  Claire [name]  has presented today for surgery, with the diagnosis of NAUSEA VOMITING  The various methods of treatment have been discussed with the patient and family. She has had nausea/vomiting almost daily.  She can usually keep liquids.  Mother with patient.  After consideration of risks, benefits and other options for treatment, the patient has consented to  Procedure(s) (LRB): ESOPHAGOGASTRODUODENOSCOPY (EGD) (N/A) as a surgical intervention .    The patients' history has been reviewed, patient examined, no change in status, stable for surgery.  I have reviewed the patients' chart and labs.  Questions were answered to the patient's satisfaction.     Rodolfo Notaro H

## 2012-03-06 NOTE — H&P (View-Only) (Signed)
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 She continues to have persistent nausea and frequent vomiting. She and her mother state that her oral intake is really quite minimal and she becomes nauseated with small amounts of solids or liquids. She denies abdominal pain or fever. She has occasional vomiting. Of note is she did have chronic severe nausea preoperatively.  Exam: BP 130/82  Pulse 70  Temp(Src) 97.8 F (36.6 C) (Temporal)  Resp 18  Ht 5' 10" (1.778 m)  Wt 271 lb (122.925 kg)  BMI 38.88 kg/m2 Total weight loss 38 pounds Gen.: She does not appear ill or dehydrated Abdomen: Soft and nontender. Wounds well healed.  Assessment and plan: Persistent nausea and occasional vomiting post bypass with limited p.o. intake. I suspect this may be exacerbated by her preoperative issues but I believe we need to rule out complications such as ulcer or stenosis. We will proceed with upper endoscopy to rule this out. I am going to check chemistries today. She will concentrate on protein shakes as much as possible. I asked her to call for any worsening symptoms I will otherwise see her back in one month pending results of the above studies. 

## 2012-03-06 NOTE — Discharge Instructions (Signed)
[facility] - DISCHARGE INSTRUCTIONS TO PATIENT  Activity:  Driving - may drive tomorrow, if doing well  Wound Care:   NA  Diet:  Post gastric bypass diet, as tolerated  Follow up appointment:  Call Dr. Jamse Mead office [facility]) at [phone] for an appointment in 4 weeks.  Medications and dosages:  Resume your home medications.  You have a prescription for:  ***  Call Dr. Johna Sheriff or his office  [phone]) if you have:  Temperature greater than 100.4,  Persistent nausea and vomiting,  Severe uncontrolled pain,  Redness, tenderness, or signs of infection (pain, swelling, redness, odor or green/yellow discharge around the site),  Difficulty breathing, headache or visual disturbances,  Any other questions or concerns you may have after discharge.  In an emergency, call 911 or go to an Emergency Department at a nearby hospital.

## 2012-03-07 ENCOUNTER — Encounter (HOSPITAL_COMMUNITY): Payer: Self-pay | Admitting: Surgery

## 2012-03-07 NOTE — Op Note (Signed)
Claire [name], MOFFITT              ACCOUNT NO.:  [id number]  MEDICAL RECORD NO.:  [id number]  LOCATION:  [facility]                        FACILITY:  PHYSICIAN:  Sandria Bales. Ezzard Standing, M.D.       DATE OF BIRTH:  DATE OF PROCEDURE: 03/06/2012                              OPERATIVE REPORT   PREOPERATIVE DIAGNOSIS:  Persistent nausea, vomiting post Roux-en-Y gastric bypass.  POSTOPERATIVE DIAGNOSIS:  Normal pouch/anastomosis post Roux-en-Y gastric bypass (esophagogastric junction at 36-37 cm and the gastrojejunal anastomosis at 42-43 cm).  PROCEDURE:  Esophagogastroduodenoscopy.  SURGEON:  Sandria Bales. Ezzard Standing, M.D.  FIRST ASSISTANT:  None.  ANESTHESIA:  100 mcg of fentanyl, 6 mg of Versed.  COMPLICATIONS:  None.  INDICATION FOR PROCEDURE:  Ms. Zarzycki is a 26 year old white female who had a Roux-en-Y gastric bypass by Dr. Glenna Fellows on January 03, 2012, so she is just a little bit over 2 months out from surgery.  She has had persistent nausea, vomiting, particularly to solid foods since the surgery and now comes for attempted upper endoscopy.  Of note, the patient has some significant comorbid issues including; 1. She takes 4 mg of Dilaudid 4 times a day for chronic back pain. 2. She is on multiple psych meds including Wellbutrin, Klonopin,     Cymbalta, Lyrica. 3. She has been on Reglan and Prevacid with some improvement in her     symptoms. The indication and potential complications were explained to the patient.  OPERATIVE NOTE:  A time-out was held and a surgical checklist run.    The back of her throat was anesthetized with 3 sprays of Cetacaine.  She was monitored with a blood pressure cuff, EKG, and pulse oximetry and had 2 L nasal O2 flowing during the procedure.  A flexible Pentax endoscope was passed down the back of her throat into her esophagus.  Her esophagogastric junction was noted at about 36-37 cm.  Her pouch was small.  Her anastomosis was opened wide enough  to pass the scope through, maybe a little bit bigger size to about 1.2 cm. I could not identify an ulcer.  She had a lot of bowel peristalsis (consistent with her being on regaln), This  made getting pictures or images a little difficult, but again I saw no ulcer, no Obstruction.  Her gastrojejunal anastomosis was at 42-43 cm.  Her jejunum down to 10-15 cm beyond the GJ anastomisis was otherwise unremarkable.  The scope was withdrawn because the anastomosis was normal, the pouch looked normal.    I did not do any biopsies.  The esophagus was normal.  Her vocal cords were normal.  The scope was then withdrawn.   She tolerated the procedure well.  I will discuss her with Dr. Johna Sheriff for further evaluation.  Her mother was at her bedside at the end of the case.   Sandria Bales. Ezzard Standing, M.D., FACS   DHN/MEDQ  D:  03/06/2012  T:  03/07/2012  Job:  [id number]

## 2012-03-23 ENCOUNTER — Ambulatory Visit (INDEPENDENT_AMBULATORY_CARE_PROVIDER_SITE_OTHER): Payer: BC Managed Care – PPO | Admitting: General Surgery

## 2012-04-06 LAB — CBC WITH DIFFERENTIAL/PLATELET
Eosinophils Relative: 3 % (ref 0–5)
HCT: 50 % — ABNORMAL HIGH (ref 36.0–46.0)
Hemoglobin: 16.2 g/dL — ABNORMAL HIGH (ref 12.0–15.0)
Lymphocytes Relative: 25 % (ref 12–46)
Lymphs Abs: 2.3 10*3/uL (ref 0.7–4.0)
MCV: 92.4 fL (ref 78.0–100.0)
Monocytes Absolute: 0.4 10*3/uL (ref 0.1–1.0)
Monocytes Relative: 5 % (ref 3–12)
Neutro Abs: 6.3 10*3/uL (ref 1.7–7.7)
RDW: 13.6 % (ref 11.5–15.5)
WBC: 9.3 10*3/uL (ref 4.0–10.5)

## 2012-04-06 LAB — COMPREHENSIVE METABOLIC PANEL
AST: 25 U/L (ref 0–37)
Albumin: 4.3 g/dL (ref 3.5–5.2)
BUN: 7 mg/dL (ref 6–23)
CO2: 25 mEq/L (ref 19–32)
Calcium: 9.2 mg/dL (ref 8.4–10.5)
Chloride: 104 mEq/L (ref 96–112)
Creat: 1 mg/dL (ref 0.50–1.10)
Glucose, Bld: 208 mg/dL — ABNORMAL HIGH (ref 70–99)
Potassium: 4.2 mEq/L (ref 3.5–5.3)

## 2012-04-12 ENCOUNTER — Ambulatory Visit (INDEPENDENT_AMBULATORY_CARE_PROVIDER_SITE_OTHER): Payer: BC Managed Care – PPO | Admitting: General Surgery

## 2012-08-01 ENCOUNTER — Telehealth (INDEPENDENT_AMBULATORY_CARE_PROVIDER_SITE_OTHER): Payer: Self-pay | Admitting: General Surgery

## 2012-08-01 NOTE — Telephone Encounter (Signed)
Patient mother called in stating the patient still has a problem with nausea. She is concerned that she has become malnourished and not taking in enough protein. Stated patient dealing with being lightheaded and weak. York Spaniel she has lost 90 lbs in the last 6 months. She already has anti-nausea medication but they only work temporarily. Wanted to know if her daughter can be seen sooner than October appointment.

## 2012-08-11 NOTE — Telephone Encounter (Signed)
Left message for patient to call our office (patient need's full bariatric lab's and earlier appointment w/Dr. Johna Sheriff)

## 2012-08-16 NOTE — Telephone Encounter (Signed)
Left message for patient to contact our office.  Patient need's lab's prior to appointment.  Left message previously for patient to call our office and have not rec'd a return call as of 08/16/12.

## 2012-08-18 ENCOUNTER — Ambulatory Visit (INDEPENDENT_AMBULATORY_CARE_PROVIDER_SITE_OTHER): Payer: BC Managed Care – PPO | Admitting: General Surgery

## 2012-08-18 ENCOUNTER — Telehealth (INDEPENDENT_AMBULATORY_CARE_PROVIDER_SITE_OTHER): Payer: Self-pay

## 2012-08-18 ENCOUNTER — Other Ambulatory Visit (INDEPENDENT_AMBULATORY_CARE_PROVIDER_SITE_OTHER): Payer: Self-pay

## 2012-08-18 DIAGNOSIS — K912 Postsurgical malabsorption, not elsewhere classified: Secondary | ICD-10-CM

## 2012-08-18 NOTE — Telephone Encounter (Signed)
Left voice message to contact our office regarding follow up Bariatric appointment w/Dr. Johna Sheriff.  Patient need's routine bariatric ab's prior to appointment.

## 2012-09-01 ENCOUNTER — Ambulatory Visit (INDEPENDENT_AMBULATORY_CARE_PROVIDER_SITE_OTHER): Payer: BC Managed Care – PPO | Admitting: General Surgery

## 2012-09-01 ENCOUNTER — Encounter (INDEPENDENT_AMBULATORY_CARE_PROVIDER_SITE_OTHER): Payer: Self-pay | Admitting: General Surgery

## 2012-09-01 DIAGNOSIS — Z9884 Bariatric surgery status: Secondary | ICD-10-CM

## 2012-09-01 DIAGNOSIS — Z6831 Body mass index (BMI) 31.0-31.9, adult: Secondary | ICD-10-CM

## 2012-09-01 NOTE — Progress Notes (Signed)
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 She generally has been getting along okay. She had issues this spring with severe nausea. At that time endoscopy was negative for ulcer or stricture. She has chronic nausea preoperatively. She reports today that she still has some issues with this but that it has been gradually improving and she is managing it. She still has a fair degree of chronic pain and is on chronic narcotics as she was preoperatively. She however is getting more active and starting to ride a bike and is overall very happy with her results so far. She has an occasional abdominal cramping with her nausea but no severe pain. She has some hair loss.  Exam: BP 118/68  Pulse 88  Temp 98.5 F (36.9 C) (Temporal)  Resp 20  Ht 5\' 9"  (1.753 m)  Wt 210 lb (95.255 kg)  BMI 31.01 kg/m2 Total weight loss 100 pounds General: No distress HEENT: No masses. Sclera nonicteric Lungs: Mild bilateral wheezes. Cardiovascular: Regular rate and rhythm. No edema. Abdomen: Soft and nontender. No masses or organomegaly. No hernias.  Assessment and plan: Overall considering her multiple medical problems preoperatively I think she is getting along well. Weight loss is right on track. Lab work was drawn today and we will call her with these results. Discussed diet and exercise strategies. She will return in 4 months. I do not see any specific complications related to her surgery.

## 2012-09-01 NOTE — Patient Instructions (Signed)
Continue regular exercise and try for 45 minutes daily  Push fluids and stay hydrated  We will call with results of your lab work

## 2012-09-02 LAB — COMPREHENSIVE METABOLIC PANEL
Albumin: 4 g/dL (ref 3.5–5.2)
BUN: 8 mg/dL (ref 6–23)
Calcium: 9.5 mg/dL (ref 8.4–10.5)
Chloride: 108 mEq/L (ref 96–112)
Glucose, Bld: 45 mg/dL — ABNORMAL LOW (ref 70–99)
Potassium: 4.5 mEq/L (ref 3.5–5.3)

## 2018-09-09 DIAGNOSIS — I471 Supraventricular tachycardia: Secondary | ICD-10-CM | POA: Insufficient documentation

## 2019-03-23 DIAGNOSIS — M4626 Osteomyelitis of vertebra, lumbar region: Secondary | ICD-10-CM | POA: Insufficient documentation

## 2020-02-03 ENCOUNTER — Ambulatory Visit: Payer: BC Managed Care – PPO | Attending: Internal Medicine

## 2020-02-05 ENCOUNTER — Ambulatory Visit: Payer: BC Managed Care – PPO | Attending: Internal Medicine

## 2020-02-05 DIAGNOSIS — Z23 Encounter for immunization: Secondary | ICD-10-CM

## 2020-02-05 NOTE — Progress Notes (Signed)
   Covid-19 Vaccination Clinic  Name:  Claire [name]    MRN: [id number] DOB: [birth year]  02/05/2020  Claire [name] was observed post Covid-19 immunization for 15 minutes without incident. She was provided with Vaccine Information Sheet and instruction to access the V-Safe system.   Claire [name] was instructed to call 911 with any severe reactions post vaccine: Marland Kitchen Difficulty breathing  . Swelling of face and throat  . A fast heartbeat  . A bad rash all over body  . Dizziness and weakness   Immunizations Administered    Name Date Dose VIS Date Route   Pfizer COVID-19 Vaccine 02/05/2020  5:27 PM 0.3 mL 11/09/2019 Intramuscular   Manufacturer: [employer], [employer]   Lot: [id number]   NDC: [address]-1

## 2020-02-27 ENCOUNTER — Ambulatory Visit: Payer: BC Managed Care – PPO

## 2020-03-05 ENCOUNTER — Ambulatory Visit: Payer: BC Managed Care – PPO | Attending: Internal Medicine

## 2020-03-05 DIAGNOSIS — Z23 Encounter for immunization: Secondary | ICD-10-CM

## 2020-03-05 NOTE — Progress Notes (Signed)
   Covid-19 Vaccination Clinic  Name:  Claire [name]    MRN: [id number] DOB: 09/09/1986  03/05/2020  [name] was observed post Covid-19 immunization for 15 minutes without incident. She was provided with Vaccine Information Sheet and instruction to access the V-Safe system.   [name] was instructed to call 911 with any severe reactions post vaccine: Marland Kitchen Difficulty breathing  . Swelling of face and throat  . A fast heartbeat  . A bad rash all over body  . Dizziness and weakness   Immunizations Administered    Name Date Dose VIS Date Route   Pfizer COVID-19 Vaccine 03/05/2020  4:20 PM 0.3 mL 11/09/2019 Intramuscular   Manufacturer: [employer], [employer]   Lot: [id number]   NDC: [address]-0

## 2021-01-13 ENCOUNTER — Telehealth: Payer: Self-pay | Admitting: Oncology

## 2021-01-13 NOTE — Telephone Encounter (Signed)
Patient referred by Clarnce Flock, PA-C for Iron Def/Anemia.  Appt made for 01/27/21 Labs 10:30 am - Consult 11:00 am

## 2021-01-26 ENCOUNTER — Other Ambulatory Visit: Payer: Self-pay | Admitting: Oncology

## 2021-01-26 DIAGNOSIS — D509 Iron deficiency anemia, unspecified: Secondary | ICD-10-CM
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
## 2021-01-27 ENCOUNTER — Inpatient Hospital Stay: Payer: BC Managed Care – PPO

## 2021-01-27 ENCOUNTER — Inpatient Hospital Stay: Payer: BC Managed Care – PPO | Admitting: Oncology

## 2021-01-27 ENCOUNTER — Telehealth: Payer: Self-pay | Admitting: Oncology

## 2021-01-27 NOTE — Telephone Encounter (Signed)
Per Damaris on 2/28, patient requested to reschedule 3/1 Appt's  Patient rescheduled to 02/09/21 Labs 10:30 am - Consult 11:00 am  Left Msg for patient to return call to Confirm rescheduled times

## 2021-02-05 ENCOUNTER — Telehealth: Payer: Self-pay | Admitting: Oncology

## 2021-02-05 NOTE — Telephone Encounter (Signed)
Per patient's mother, patient had Hospital visit where she received Transfusion/Supplements.  Will try that and then will call if needed here

## 2021-02-09 ENCOUNTER — Inpatient Hospital Stay: Payer: BC Managed Care – PPO | Admitting: Oncology

## 2021-02-09 ENCOUNTER — Inpatient Hospital Stay: Payer: BC Managed Care – PPO

## 2021-03-01 ENCOUNTER — Emergency Department
Admission: EM | Admit: 2021-03-01 | Discharge: 2021-03-02 | Disposition: A | Discharge status: Home / Self Care | Payer: Medicaid Other | Attending: Emergency Medicine | Admitting: Emergency Medicine

## 2021-03-01 ENCOUNTER — Other Ambulatory Visit: Payer: Self-pay

## 2021-03-01 ENCOUNTER — Encounter: Payer: Self-pay | Admitting: Intensive Care

## 2021-03-01 DIAGNOSIS — G8929 Other chronic pain: Secondary | ICD-10-CM | POA: Diagnosis not present

## 2021-03-01 DIAGNOSIS — Z7951 Long term (current) use of inhaled steroids: Secondary | ICD-10-CM | POA: Insufficient documentation

## 2021-03-01 DIAGNOSIS — R45851 Suicidal ideations: Secondary | ICD-10-CM | POA: Diagnosis not present

## 2021-03-01 DIAGNOSIS — F1910 Other psychoactive substance abuse, uncomplicated: Secondary | ICD-10-CM

## 2021-03-01 DIAGNOSIS — F17210 Nicotine dependence, cigarettes, uncomplicated: Secondary | ICD-10-CM | POA: Diagnosis not present

## 2021-03-01 DIAGNOSIS — Z20822 Contact with and (suspected) exposure to covid-19: Secondary | ICD-10-CM | POA: Diagnosis not present

## 2021-03-01 DIAGNOSIS — F1510 Other stimulant abuse, uncomplicated: Secondary | ICD-10-CM | POA: Diagnosis not present

## 2021-03-01 DIAGNOSIS — J45909 Unspecified asthma, uncomplicated: Secondary | ICD-10-CM | POA: Diagnosis not present

## 2021-03-01 DIAGNOSIS — Z046 Encounter for general psychiatric examination, requested by authority: Secondary | ICD-10-CM | POA: Insufficient documentation

## 2021-03-01 DIAGNOSIS — I10 Essential (primary) hypertension: Secondary | ICD-10-CM | POA: Insufficient documentation

## 2021-03-01 DIAGNOSIS — F32A Depression, unspecified: Secondary | ICD-10-CM | POA: Diagnosis present

## 2021-03-01 DIAGNOSIS — M545 Low back pain, unspecified: Secondary | ICD-10-CM | POA: Diagnosis not present

## 2021-03-01 DIAGNOSIS — F332 Major depressive disorder, recurrent severe without psychotic features: Secondary | ICD-10-CM

## 2021-03-01 HISTORY — DX: Osteomyelitis, unspecified: M86.9

## 2021-03-01 LAB — COMPREHENSIVE METABOLIC PANEL
ALT: 18 U/L (ref 0–44)
AST: 19 U/L (ref 15–41)
Albumin: 3.6 g/dL (ref 3.5–5.0)
Alkaline Phosphatase: 78 U/L (ref 38–126)
Anion gap: 8 (ref 5–15)
BUN: 11 mg/dL (ref 6–20)
CO2: 22 mmol/L (ref 22–32)
Calcium: 9.1 mg/dL (ref 8.9–10.3)
Chloride: 104 mmol/L (ref 98–111)
Creatinine, Ser: 0.85 mg/dL (ref 0.44–1.00)
GFR, Estimated: >60 mL/min (ref >60)
Glucose, Bld: 93 mg/dL (ref 70–99)
Potassium: 4.6 mmol/L (ref 3.5–5.1)
Sodium: 134 mmol/L — ABNORMAL LOW (ref 135–145)
Total Bilirubin: 0.6 mg/dL (ref 0.3–1.2)
Total Protein: 6.6 g/dL (ref 6.5–8.1)

## 2021-03-01 LAB — RESP PANEL BY RT-PCR (FLU A&B, COVID) ARPGX2
Influenza A by PCR: NEGATIVE
Influenza B by PCR: NEGATIVE
SARS Coronavirus 2 by RT PCR: NEGATIVE

## 2021-03-01 LAB — CBC
HCT: 41.8 % (ref 36.0–46.0)
Hemoglobin: 13.4 g/dL (ref 12.0–15.0)
MCH: 29 pg (ref 26.0–34.0)
MCHC: 32.1 g/dL (ref 30.0–36.0)
MCV: 90.5 fL (ref 80.0–100.0)
Platelets: 323 10*3/uL (ref 150–400)
RBC: 4.62 MIL/uL (ref 3.87–5.11)
RDW: 22.1 % — ABNORMAL HIGH (ref 11.5–15.5)
WBC: 4.9 10*3/uL (ref 4.0–10.5)
nRBC: 0 % (ref 0.0–0.2)

## 2021-03-01 LAB — ETHANOL: Alcohol, Ethyl (B): <10 mg/dL (ref <10)

## 2021-03-01 LAB — ACETAMINOPHEN LEVEL: Acetaminophen (Tylenol), Serum: <10 ug/mL — ABNORMAL LOW (ref 10–30)

## 2021-03-01 LAB — SALICYLATE LEVEL: Salicylate Lvl: <7 mg/dL — ABNORMAL LOW (ref 7.0–30.0)

## 2021-03-01 MED ORDER — SULFAMETHOXAZOLE-TRIMETHOPRIM 800-160 MG PO TABS
1.0000 | ORAL_TABLET | Freq: Two times a day (BID) | ORAL | Status: DC
  Administered 2021-03-01 – 2021-03-02 (×2): 1 via ORAL
  Filled 2021-03-01 (×4): qty 1

## 2021-03-01 MED ORDER — DICYCLOMINE HCL 10 MG PO CAPS
20.0000 mg | ORAL_CAPSULE | Freq: Once | ORAL | Status: AC
  Administered 2021-03-01: 20 mg via ORAL
  Filled 2021-03-01 (×2): qty 2

## 2021-03-01 NOTE — ED Notes (Signed)
Hourly rounding reveals patient in room. No complaints, stable, in no acute distress. Q15 minute rounds and monitoring via Security Cameras to continue. 

## 2021-03-01 NOTE — ED Notes (Signed)
ED provider talking with patient.

## 2021-03-01 NOTE — ED Notes (Signed)
Perel Hauschild (mother) taking all patients personal belongings home. Clothes and jewelry. Blanket.
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
## 2021-03-01 NOTE — ED Notes (Signed)
Pt. Transferred to B[facility] from ED to room 4 after screening for contraband. Report to include Situation, Background, Assessment and Recommendations from Dawn RN. Pt. Oriented to unit including Q15 minute rounds as well as the security cameras for their protection. Patient is alert and oriented, warm and dry in no acute distress. Patient denies SI, HI, and AVH. Pt. Encouraged to let me know if needs arise.  
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
## 2021-03-01 NOTE — ED Triage Notes (Addendum)
Patient presents with mom for depression and suicidal thoughts. Reports no plan. Recently seen PCP this past week and had abscess drained and placed on antibiotics. Patient also has reddened, hard abscess present to right breast. Patient admits to IV drug use recently and using meth yesterday. Denies alcohol. Patient admits to not taking prescribed psych meds or antibiotics. Patient also has moments of jerking/twitching that mother says is new for her. HX chronic back pain and fibromyalgia. Call mother for information as patient is poor historian and does not give much info

## 2021-03-01 NOTE — ED Notes (Signed)
Pt given [place] sandwich tray. No other needs at this time.

## 2021-03-01 NOTE — ED Notes (Signed)
Patient c/o generalized muscle spasm. EDP aware

## 2021-03-01 NOTE — ED Notes (Signed)
Claire [name] with TTS speaking with patient.

## 2021-03-01 NOTE — Discharge Instructions (Addendum)
Pt cleared by psych 

## 2021-03-02 DIAGNOSIS — F332 Major depressive disorder, recurrent severe without psychotic features: Secondary | ICD-10-CM

## 2021-03-02 DIAGNOSIS — F1510 Other stimulant abuse, uncomplicated: Secondary | ICD-10-CM

## 2021-03-02 MED ORDER — DULOXETINE HCL 30 MG PO CPEP: 90.0000 mg | ORAL_CAPSULE | Freq: Every day | ORAL | 1 refills | Status: DC

## 2021-03-02 MED ORDER — PANTOPRAZOLE SODIUM 40 MG PO TBEC
40.0000 mg | DELAYED_RELEASE_TABLET | Freq: Every day | ORAL | Status: DC
  Administered 2021-03-02: 40 mg via ORAL
  Filled 2021-03-02: qty 1

## 2021-03-02 MED ORDER — SUMATRIPTAN SUCCINATE 50 MG PO TABS
100.0000 mg | ORAL_TABLET | ORAL | Status: DC | PRN
  Filled 2021-03-02: qty 2

## 2021-03-02 MED ORDER — DRONABINOL 2.5 MG PO CAPS
5.0000 mg | ORAL_CAPSULE | Freq: Two times a day (BID) | ORAL | Status: DC | PRN
  Filled 2021-03-02: qty 2
  Filled 2021-03-02: qty 1
  Filled 2021-03-02: qty 2

## 2021-03-02 MED ORDER — ONDANSETRON 4 MG PO TBDP
4.0000 mg | ORAL_TABLET | Freq: Three times a day (TID) | ORAL | Status: DC | PRN
  Administered 2021-03-02: 4 mg via ORAL
  Filled 2021-03-02 (×3): qty 1

## 2021-03-02 MED ORDER — CLONAZEPAM 1 MG PO TABS
1.0000 mg | ORAL_TABLET | Freq: Two times a day (BID) | ORAL | Status: DC | PRN
  Administered 2021-03-02: 1 mg via ORAL
  Filled 2021-03-02: qty 1

## 2021-03-02 MED ORDER — ACETAMINOPHEN 500 MG PO TABS
1000.0000 mg | ORAL_TABLET | Freq: Four times a day (QID) | ORAL | Status: DC | PRN
  Administered 2021-03-02: 1000 mg via ORAL
  Filled 2021-03-02: qty 2

## 2021-03-02 MED ORDER — BREXPIPRAZOLE 0.5 MG PO TABS: 0.5000 mg | ORAL_TABLET | Freq: Every day | ORAL | 1 refills | Status: DC

## 2021-03-02 MED ORDER — DULOXETINE HCL 60 MG PO CPEP
90.0000 mg | ORAL_CAPSULE | Freq: Every day | ORAL | Status: DC
  Filled 2021-03-02: qty 1

## 2021-03-02 MED ORDER — GABAPENTIN 600 MG PO TABS
600.0000 mg | ORAL_TABLET | Freq: Three times a day (TID) | ORAL | Status: DC
  Administered 2021-03-02: 600 mg via ORAL
  Filled 2021-03-02: qty 1

## 2021-03-02 MED ORDER — ARIPIPRAZOLE 10 MG PO TABS: 10.0000 mg | ORAL_TABLET | Freq: Every day | ORAL | Status: DC

## 2021-03-02 MED ORDER — ALBUTEROL SULFATE (2.5 MG/3ML) 0.083% IN NEBU
2.5000 mg | INHALATION_SOLUTION | Freq: Four times a day (QID) | RESPIRATORY_TRACT | Status: DC | PRN
  Filled 2021-03-02: qty 3

## 2021-03-02 MED ORDER — LANSOPRAZOLE 30 MG PO TBDP: 30.0000 mg | ORAL_TABLET | Freq: Every day | ORAL | Status: DC

## 2021-03-02 MED ORDER — MOMETASONE FURO-FORMOTEROL FUM 200-5 MCG/ACT IN AERO
2.0000 | INHALATION_SPRAY | Freq: Two times a day (BID) | RESPIRATORY_TRACT | Status: DC
  Filled 2021-03-02: qty 8.8

## 2021-03-02 MED ORDER — BUPROPION HCL ER (XL) 300 MG PO TB24: 300.0000 mg | ORAL_TABLET | Freq: Every day | ORAL | Status: DC

## 2021-03-02 NOTE — ED Notes (Signed)
Hourly rounding reveals patient in room. No complaints, stable, in no acute distress. Q15 minute rounds and monitoring via Security Cameras to continue. 

## 2021-03-02 NOTE — ED Notes (Signed)
VS not taken, Patient is sleep.

## 2021-03-02 NOTE — ED Notes (Signed)
Pt given phone to call her mother. Also given ginger ale. Pt has no other needs at this time. Pt will return phone when done.

## 2021-03-02 NOTE — ED Notes (Signed)
Pt given breakfast tray

## 2021-03-02 NOTE — ED Notes (Signed)
Pt given lunch box, extra apple juice, and graham crackers.
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
## 2021-03-02 NOTE — ED Notes (Signed)
Sat with Mother of pt while they had a visit. RN Annette Stable gave permission for visit to be longer due to family waiting on Doctor. While Doctor was very busy they discontinued the visit, and had her wait in lobby until after Doctor talked with pt.

## 2021-03-02 NOTE — ED Provider Notes (Signed)
Emergency Medicine Observation Re-evaluation Note  Claire [name] is a 35 y.o. female, seen on rounds today.  Pt initially presented to the ED for complaints of Depression and Suicidal Currently, the patient is sitting on the side of her bed, complains of chronic back pain, unchanged today with no numbness or weakness.  Physical Exam  BP (!) 142/114 (BP Location: Right Arm)   Pulse 88   Temp 98.3 F (36.8 C) (Oral)   Resp 18   Ht 5\' 10"  (1.778 m)   Wt 72.6 kg   SpO2 100%   BMI 22.96 kg/m  Physical Exam Constitutional: Resting comfortably. Eyes: Conjunctivae are normal. Head: Atraumatic. Nose: No congestion/rhinnorhea. Mouth/Throat: Mucous membranes are moist. Neck: Normal ROM Cardiovascular: No cyanosis noted. Respiratory: Normal respiratory effort. Gastrointestinal: Non-distended. Genitourinary: deferred Musculoskeletal: No lower extremity tenderness nor edema. Neurologic:  Normal speech and language. No gross focal neurologic deficits are appreciated. Skin:  Skin is warm, dry and intact. No rash noted.    ED Course / MDM  EKG:   I have reviewed the labs performed to date as well as medications administered while in observation.  Recent changes in the last 24 hours include none.  Plan  Current plan is for further psychiatric evaluation. Patient is under full IVC at this time.   , MD 03/02/21 [phone]

## 2021-04-10 DIAGNOSIS — A419 Sepsis, unspecified organism: Secondary | ICD-10-CM | POA: Diagnosis not present

## 2021-04-19 DIAGNOSIS — A419 Sepsis, unspecified organism: Secondary | ICD-10-CM | POA: Diagnosis not present

## 2021-12-09 DIAGNOSIS — J189 Pneumonia, unspecified organism: Secondary | ICD-10-CM

## 2021-12-09 DIAGNOSIS — R079 Chest pain, unspecified: Secondary | ICD-10-CM

## 2021-12-17 DIAGNOSIS — B182 Chronic viral hepatitis C: Secondary | ICD-10-CM | POA: Insufficient documentation

## 2022-02-13 ENCOUNTER — Encounter (HOSPITAL_BASED_OUTPATIENT_CLINIC_OR_DEPARTMENT_OTHER): Payer: Self-pay | Admitting: Pediatrics

## 2022-02-13 ENCOUNTER — Inpatient Hospital Stay (HOSPITAL_BASED_OUTPATIENT_CLINIC_OR_DEPARTMENT_OTHER)
Admission: EM | Admit: 2022-02-13 | Discharge: 2022-02-20 | DRG: 872 | Disposition: A | Discharge status: Home / Self Care | Payer: Medicaid Other | Attending: Internal Medicine | Admitting: Internal Medicine

## 2022-02-13 ENCOUNTER — Emergency Department (HOSPITAL_BASED_OUTPATIENT_CLINIC_OR_DEPARTMENT_OTHER): Payer: Medicaid Other

## 2022-02-13 ENCOUNTER — Other Ambulatory Visit: Payer: Self-pay

## 2022-02-13 DIAGNOSIS — J45909 Unspecified asthma, uncomplicated: Secondary | ICD-10-CM | POA: Diagnosis present

## 2022-02-13 DIAGNOSIS — A419 Sepsis, unspecified organism: Principal | ICD-10-CM | POA: Diagnosis present

## 2022-02-13 DIAGNOSIS — R7989 Other specified abnormal findings of blood chemistry: Secondary | ICD-10-CM

## 2022-02-13 DIAGNOSIS — F1990 Other psychoactive substance use, unspecified, uncomplicated: Secondary | ICD-10-CM

## 2022-02-13 DIAGNOSIS — Z7151 Drug abuse counseling and surveillance of drug abuser: Secondary | ICD-10-CM

## 2022-02-13 DIAGNOSIS — M797 Fibromyalgia: Secondary | ICD-10-CM | POA: Diagnosis present

## 2022-02-13 DIAGNOSIS — F17210 Nicotine dependence, cigarettes, uncomplicated: Secondary | ICD-10-CM | POA: Diagnosis present

## 2022-02-13 DIAGNOSIS — R6510 Systemic inflammatory response syndrome (SIRS) of non-infectious origin without acute organ dysfunction: Secondary | ICD-10-CM

## 2022-02-13 DIAGNOSIS — F32A Depression, unspecified: Secondary | ICD-10-CM | POA: Diagnosis present

## 2022-02-13 DIAGNOSIS — D72829 Elevated white blood cell count, unspecified: Secondary | ICD-10-CM

## 2022-02-13 DIAGNOSIS — Z79899 Other long term (current) drug therapy: Secondary | ICD-10-CM

## 2022-02-13 DIAGNOSIS — N179 Acute kidney failure, unspecified: Secondary | ICD-10-CM

## 2022-02-13 DIAGNOSIS — L03114 Cellulitis of left upper limb: Secondary | ICD-10-CM | POA: Diagnosis present

## 2022-02-13 DIAGNOSIS — L0390 Cellulitis, unspecified: Secondary | ICD-10-CM | POA: Diagnosis present

## 2022-02-13 DIAGNOSIS — Z793 Long term (current) use of hormonal contraceptives: Secondary | ICD-10-CM

## 2022-02-13 DIAGNOSIS — Z9884 Bariatric surgery status: Secondary | ICD-10-CM

## 2022-02-13 DIAGNOSIS — E8721 Acute metabolic acidosis: Secondary | ICD-10-CM | POA: Diagnosis present

## 2022-02-13 DIAGNOSIS — Z91041 Radiographic dye allergy status: Secondary | ICD-10-CM

## 2022-02-13 DIAGNOSIS — Z9049 Acquired absence of other specified parts of digestive tract: Secondary | ICD-10-CM

## 2022-02-13 DIAGNOSIS — Z8614 Personal history of Methicillin resistant Staphylococcus aureus infection: Secondary | ICD-10-CM

## 2022-02-13 DIAGNOSIS — M1990 Unspecified osteoarthritis, unspecified site: Secondary | ICD-10-CM | POA: Diagnosis present

## 2022-02-13 DIAGNOSIS — F1910 Other psychoactive substance abuse, uncomplicated: Secondary | ICD-10-CM | POA: Diagnosis present

## 2022-02-13 DIAGNOSIS — M549 Dorsalgia, unspecified: Secondary | ICD-10-CM | POA: Diagnosis present

## 2022-02-13 DIAGNOSIS — K219 Gastro-esophageal reflux disease without esophagitis: Secondary | ICD-10-CM | POA: Diagnosis present

## 2022-02-13 DIAGNOSIS — R768 Other specified abnormal immunological findings in serum: Secondary | ICD-10-CM

## 2022-02-13 DIAGNOSIS — Z7951 Long term (current) use of inhaled steroids: Secondary | ICD-10-CM

## 2022-02-13 DIAGNOSIS — G8929 Other chronic pain: Secondary | ICD-10-CM | POA: Diagnosis present

## 2022-02-13 DIAGNOSIS — F329 Major depressive disorder, single episode, unspecified: Secondary | ICD-10-CM

## 2022-02-13 DIAGNOSIS — Z885 Allergy status to narcotic agent status: Secondary | ICD-10-CM

## 2022-02-13 DIAGNOSIS — F411 Generalized anxiety disorder: Secondary | ICD-10-CM

## 2022-02-13 DIAGNOSIS — E871 Hypo-osmolality and hyponatremia: Secondary | ICD-10-CM | POA: Diagnosis present

## 2022-02-13 LAB — CBC WITH DIFFERENTIAL/PLATELET
Abs Immature Granulocytes: 0.22 10*3/uL — ABNORMAL HIGH (ref 0.00–0.07)
Basophils Absolute: 0.1 10*3/uL (ref 0.0–0.1)
Basophils Relative: 0 %
Eosinophils Absolute: 0.1 10*3/uL (ref 0.0–0.5)
Eosinophils Relative: 0 %
HCT: 45.2 % (ref 36.0–46.0)
Hemoglobin: 14.6 g/dL (ref 12.0–15.0)
Immature Granulocytes: 1 %
Lymphocytes Relative: 5 %
Lymphs Abs: 1 10*3/uL (ref 0.7–4.0)
MCH: 30.1 pg (ref 26.0–34.0)
MCHC: 32.3 g/dL (ref 30.0–36.0)
MCV: 93.2 fL (ref 80.0–100.0)
Monocytes Absolute: 0.3 10*3/uL (ref 0.1–1.0)
Monocytes Relative: 2 %
Neutro Abs: 16.9 10*3/uL — ABNORMAL HIGH (ref 1.7–7.7)
Neutrophils Relative %: 92 %
Platelets: 237 10*3/uL (ref 150–400)
RBC: 4.85 MIL/uL (ref 3.87–5.11)
RDW: 12.8 % (ref 11.5–15.5)
WBC: 18.5 10*3/uL — ABNORMAL HIGH (ref 4.0–10.5)
nRBC: 0 % (ref 0.0–0.2)

## 2022-02-13 LAB — COMPREHENSIVE METABOLIC PANEL
ALT: 76 U/L — ABNORMAL HIGH (ref 0–44)
AST: 42 U/L — ABNORMAL HIGH (ref 15–41)
Albumin: 3.6 g/dL (ref 3.5–5.0)
Alkaline Phosphatase: 236 U/L — ABNORMAL HIGH (ref 38–126)
Anion gap: 11 (ref 5–15)
BUN: 22 mg/dL — ABNORMAL HIGH (ref 6–20)
CO2: 20 mmol/L — ABNORMAL LOW (ref 22–32)
Calcium: 9.3 mg/dL (ref 8.9–10.3)
Chloride: 102 mmol/L (ref 98–111)
Creatinine, Ser: 1.4 mg/dL — ABNORMAL HIGH (ref 0.44–1.00)
GFR, Estimated: 50 mL/min — ABNORMAL LOW (ref >60)
Glucose, Bld: 123 mg/dL — ABNORMAL HIGH (ref 70–99)
Potassium: 3.8 mmol/L (ref 3.5–5.1)
Sodium: 133 mmol/L — ABNORMAL LOW (ref 135–145)
Total Bilirubin: 0.3 mg/dL (ref 0.3–1.2)
Total Protein: 7.4 g/dL (ref 6.5–8.1)

## 2022-02-13 LAB — PROTIME-INR
INR: 1.1 (ref 0.8–1.2)
Prothrombin Time: 13.7 seconds (ref 11.4–15.2)

## 2022-02-13 LAB — LACTIC ACID, PLASMA: Lactic Acid, Venous: 1.6 mmol/L (ref 0.5–1.9)

## 2022-02-13 LAB — URINALYSIS, ROUTINE W REFLEX MICROSCOPIC
Bilirubin Urine: NEGATIVE
Cellular Cast, UA: 3
Glucose, UA: NEGATIVE mg/dL
Hgb urine dipstick: NEGATIVE
Ketones, ur: NEGATIVE mg/dL
Leukocytes,Ua: NEGATIVE
Nitrite: NEGATIVE
Protein, ur: 30 mg/dL — AB
Specific Gravity, Urine: 1.021 (ref 1.005–1.030)
pH: 6 (ref 5.0–8.0)

## 2022-02-13 LAB — PREGNANCY, URINE: Preg Test, Ur: NEGATIVE

## 2022-02-13 MED ORDER — LACTATED RINGERS IV BOLUS
1000.0000 mL | Freq: Once | INTRAVENOUS | Status: AC
  Administered 2022-02-14: 1000 mL via INTRAVENOUS

## 2022-02-13 MED ORDER — PIPERACILLIN-TAZOBACTAM 3.375 G IVPB 30 MIN
3.3750 g | Freq: Once | INTRAVENOUS | Status: AC
  Administered 2022-02-14: 3.375 g via INTRAVENOUS
  Filled 2022-02-13: qty 50

## 2022-02-13 MED ORDER — HYDROMORPHONE HCL 1 MG/ML IJ SOLN
1.0000 mg | Freq: Once | INTRAMUSCULAR | Status: AC
  Administered 2022-02-14: 1 mg via INTRAVENOUS
  Filled 2022-02-13: qty 1

## 2022-02-13 MED ORDER — VANCOMYCIN HCL IN DEXTROSE 1-5 GM/200ML-% IV SOLN
1000.0000 mg | INTRAVENOUS | Status: AC
  Administered 2022-02-14: 1000 mg via INTRAVENOUS
  Filled 2022-02-13: qty 200
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
## 2022-02-13 NOTE — ED Triage Notes (Addendum)
C/O left arm swelling noticed last night + history of IV drug use.  Reported started PO ABX yesterday, bactrim and Clindamycin. 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 ?

## 2022-02-14 ENCOUNTER — Encounter (HOSPITAL_COMMUNITY): Payer: Self-pay | Admitting: Internal Medicine

## 2022-02-14 ENCOUNTER — Emergency Department (HOSPITAL_BASED_OUTPATIENT_CLINIC_OR_DEPARTMENT_OTHER): Payer: Medicaid Other

## 2022-02-14 ENCOUNTER — Inpatient Hospital Stay (HOSPITAL_COMMUNITY): Payer: Medicaid Other

## 2022-02-14 DIAGNOSIS — F17210 Nicotine dependence, cigarettes, uncomplicated: Secondary | ICD-10-CM | POA: Diagnosis present

## 2022-02-14 DIAGNOSIS — E8721 Acute metabolic acidosis: Secondary | ICD-10-CM | POA: Diagnosis present

## 2022-02-14 DIAGNOSIS — F1910 Other psychoactive substance abuse, uncomplicated: Secondary | ICD-10-CM

## 2022-02-14 DIAGNOSIS — R768 Other specified abnormal immunological findings in serum: Secondary | ICD-10-CM | POA: Diagnosis not present

## 2022-02-14 DIAGNOSIS — Z7951 Long term (current) use of inhaled steroids: Secondary | ICD-10-CM | POA: Diagnosis not present

## 2022-02-14 DIAGNOSIS — A419 Sepsis, unspecified organism: Secondary | ICD-10-CM | POA: Diagnosis present

## 2022-02-14 DIAGNOSIS — Z7151 Drug abuse counseling and surveillance of drug abuser: Secondary | ICD-10-CM | POA: Diagnosis not present

## 2022-02-14 DIAGNOSIS — F1990 Other psychoactive substance use, unspecified, uncomplicated: Secondary | ICD-10-CM

## 2022-02-14 DIAGNOSIS — F411 Generalized anxiety disorder: Secondary | ICD-10-CM

## 2022-02-14 DIAGNOSIS — F329 Major depressive disorder, single episode, unspecified: Secondary | ICD-10-CM | POA: Diagnosis present

## 2022-02-14 DIAGNOSIS — Z91041 Radiographic dye allergy status: Secondary | ICD-10-CM | POA: Diagnosis not present

## 2022-02-14 DIAGNOSIS — E871 Hypo-osmolality and hyponatremia: Secondary | ICD-10-CM | POA: Diagnosis present

## 2022-02-14 DIAGNOSIS — Z793 Long term (current) use of hormonal contraceptives: Secondary | ICD-10-CM | POA: Diagnosis not present

## 2022-02-14 DIAGNOSIS — Z885 Allergy status to narcotic agent status: Secondary | ICD-10-CM | POA: Diagnosis not present

## 2022-02-14 DIAGNOSIS — F32A Depression, unspecified: Secondary | ICD-10-CM | POA: Diagnosis present

## 2022-02-14 DIAGNOSIS — M1990 Unspecified osteoarthritis, unspecified site: Secondary | ICD-10-CM | POA: Diagnosis present

## 2022-02-14 DIAGNOSIS — Z79899 Other long term (current) drug therapy: Secondary | ICD-10-CM | POA: Diagnosis not present

## 2022-02-14 DIAGNOSIS — Z8614 Personal history of Methicillin resistant Staphylococcus aureus infection: Secondary | ICD-10-CM | POA: Diagnosis not present

## 2022-02-14 DIAGNOSIS — K219 Gastro-esophageal reflux disease without esophagitis: Secondary | ICD-10-CM | POA: Diagnosis present

## 2022-02-14 DIAGNOSIS — N179 Acute kidney failure, unspecified: Secondary | ICD-10-CM | POA: Diagnosis present

## 2022-02-14 DIAGNOSIS — L0390 Cellulitis, unspecified: Secondary | ICD-10-CM | POA: Diagnosis present

## 2022-02-14 DIAGNOSIS — Z9884 Bariatric surgery status: Secondary | ICD-10-CM | POA: Diagnosis not present

## 2022-02-14 DIAGNOSIS — M549 Dorsalgia, unspecified: Secondary | ICD-10-CM | POA: Diagnosis present

## 2022-02-14 DIAGNOSIS — G8929 Other chronic pain: Secondary | ICD-10-CM | POA: Diagnosis present

## 2022-02-14 DIAGNOSIS — R7989 Other specified abnormal findings of blood chemistry: Secondary | ICD-10-CM | POA: Diagnosis not present

## 2022-02-14 DIAGNOSIS — R6510 Systemic inflammatory response syndrome (SIRS) of non-infectious origin without acute organ dysfunction: Secondary | ICD-10-CM

## 2022-02-14 DIAGNOSIS — M797 Fibromyalgia: Secondary | ICD-10-CM | POA: Diagnosis present

## 2022-02-14 DIAGNOSIS — L03114 Cellulitis of left upper limb: Secondary | ICD-10-CM

## 2022-02-14 DIAGNOSIS — Z9049 Acquired absence of other specified parts of digestive tract: Secondary | ICD-10-CM | POA: Diagnosis not present

## 2022-02-14 DIAGNOSIS — J45909 Unspecified asthma, uncomplicated: Secondary | ICD-10-CM | POA: Diagnosis not present

## 2022-02-14 LAB — CBC
HCT: 36.4 % (ref 36.0–46.0)
Hemoglobin: 12 g/dL (ref 12.0–15.0)
MCH: 30.7 pg (ref 26.0–34.0)
MCHC: 33 g/dL (ref 30.0–36.0)
MCV: 93.1 fL (ref 80.0–100.0)
Platelets: 198 10*3/uL (ref 150–400)
RBC: 3.91 MIL/uL (ref 3.87–5.11)
RDW: 13 % (ref 11.5–15.5)
WBC: 12 10*3/uL — ABNORMAL HIGH (ref 4.0–10.5)
nRBC: 0 % (ref 0.0–0.2)

## 2022-02-14 LAB — HEPATITIS PANEL, ACUTE
HCV Ab: REACTIVE — AB
Hep A IgM: NONREACTIVE
Hep B C IgM: NONREACTIVE
Hepatitis B Surface Ag: NONREACTIVE

## 2022-02-14 LAB — CREATININE, SERUM
Creatinine, Ser: 1.05 mg/dL — ABNORMAL HIGH (ref 0.44–1.00)
GFR, Estimated: >60 mL/min (ref >60)

## 2022-02-14 LAB — HIV ANTIBODY (ROUTINE TESTING W REFLEX): HIV Screen 4th Generation wRfx: NONREACTIVE

## 2022-02-14 LAB — LACTIC ACID, PLASMA: Lactic Acid, Venous: 1.2 mmol/L (ref 0.5–1.9)

## 2022-02-14 MED ORDER — SODIUM CHLORIDE 0.9 % IV SOLN
2.0000 g | Freq: Three times a day (TID) | INTRAVENOUS | Status: DC
  Administered 2022-02-14 – 2022-02-17 (×8): 2 g via INTRAVENOUS
  Filled 2022-02-14 (×9): qty 2

## 2022-02-14 MED ORDER — LACTATED RINGERS IV SOLN: Freq: Once | INTRAVENOUS | Status: AC

## 2022-02-14 MED ORDER — DIPHENHYDRAMINE HCL 25 MG PO CAPS
25.0000 mg | ORAL_CAPSULE | Freq: Four times a day (QID) | ORAL | Status: DC | PRN
  Administered 2022-02-14 – 2022-02-19 (×3): 25 mg via ORAL
  Filled 2022-02-14 (×3): qty 1

## 2022-02-14 MED ORDER — PROCHLORPERAZINE EDISYLATE 10 MG/2ML IJ SOLN
10.0000 mg | Freq: Four times a day (QID) | INTRAMUSCULAR | Status: DC | PRN
  Administered 2022-02-14 – 2022-02-19 (×4): 10 mg via INTRAVENOUS
  Filled 2022-02-14 (×4): qty 2

## 2022-02-14 MED ORDER — ONDANSETRON HCL 4 MG/2ML IJ SOLN
4.0000 mg | Freq: Three times a day (TID) | INTRAMUSCULAR | Status: DC | PRN
  Administered 2022-02-14 – 2022-02-20 (×7): 4 mg via INTRAVENOUS
  Filled 2022-02-14 (×7): qty 2

## 2022-02-14 MED ORDER — DOCUSATE SODIUM 100 MG PO CAPS
100.0000 mg | ORAL_CAPSULE | Freq: Two times a day (BID) | ORAL | Status: DC
  Administered 2022-02-14 (×2): 100 mg via ORAL
  Filled 2022-02-14 (×2): qty 1

## 2022-02-14 MED ORDER — ACETAMINOPHEN 500 MG PO TABS
1000.0000 mg | ORAL_TABLET | Freq: Once | ORAL | Status: AC
  Administered 2022-02-14: 1000 mg via ORAL
  Filled 2022-02-14: qty 2

## 2022-02-14 MED ORDER — ALBUTEROL SULFATE (2.5 MG/3ML) 0.083% IN NEBU: 3.0000 mL | INHALATION_SOLUTION | Freq: Four times a day (QID) | RESPIRATORY_TRACT | Status: DC | PRN

## 2022-02-14 MED ORDER — TRAZODONE HCL 100 MG PO TABS
100.0000 mg | ORAL_TABLET | Freq: Every day | ORAL | Status: DC
  Administered 2022-02-14 – 2022-02-19 (×6): 100 mg via ORAL
  Filled 2022-02-14 (×6): qty 1

## 2022-02-14 MED ORDER — MONTELUKAST SODIUM 10 MG PO TABS
10.0000 mg | ORAL_TABLET | Freq: Every day | ORAL | Status: DC
  Administered 2022-02-14 – 2022-02-19 (×6): 10 mg via ORAL
  Filled 2022-02-14 (×6): qty 1

## 2022-02-14 MED ORDER — SERTRALINE HCL 50 MG PO TABS
50.0000 mg | ORAL_TABLET | Freq: Every day | ORAL | Status: DC
  Administered 2022-02-14 – 2022-02-20 (×7): 50 mg via ORAL
  Filled 2022-02-14 (×7): qty 1

## 2022-02-14 MED ORDER — HYDROMORPHONE HCL 1 MG/ML IJ SOLN
1.0000 mg | Freq: Once | INTRAMUSCULAR | Status: AC
  Administered 2022-02-14: 1 mg via INTRAVENOUS
  Filled 2022-02-14: qty 1

## 2022-02-14 MED ORDER — TIZANIDINE HCL 4 MG PO TABS
4.0000 mg | ORAL_TABLET | Freq: Three times a day (TID) | ORAL | Status: DC
  Administered 2022-02-14: 4 mg via ORAL
  Filled 2022-02-14: qty 1

## 2022-02-14 MED ORDER — IOHEXOL 300 MG/ML  SOLN
100.0000 mL | Freq: Once | INTRAMUSCULAR | Status: AC | PRN
  Administered 2022-02-14: 100 mL via INTRAVENOUS

## 2022-02-14 MED ORDER — BUSPIRONE HCL 10 MG PO TABS
30.0000 mg | ORAL_TABLET | Freq: Three times a day (TID) | ORAL | Status: DC
Start: 2022-02-14 — End: 2022-02-20
  Administered 2022-02-14 – 2022-02-20 (×18): 30 mg via ORAL
  Filled 2022-02-14 (×18): qty 3

## 2022-02-14 MED ORDER — ACETAMINOPHEN 325 MG PO TABS
650.0000 mg | ORAL_TABLET | Freq: Four times a day (QID) | ORAL | Status: DC | PRN
  Administered 2022-02-14 – 2022-02-18 (×3): 650 mg via ORAL
  Filled 2022-02-14 (×3): qty 2

## 2022-02-14 MED ORDER — ACETAMINOPHEN 650 MG RE SUPP
650.0000 mg | Freq: Four times a day (QID) | RECTAL | Status: DC | PRN
Start: 2022-02-14 — End: 2022-02-20

## 2022-02-14 MED ORDER — LIP MEDEX EX OINT
TOPICAL_OINTMENT | CUTANEOUS | Status: DC | PRN
  Administered 2022-02-14: 75 via TOPICAL
  Filled 2022-02-14: qty 7

## 2022-02-14 MED ORDER — DIPHENHYDRAMINE HCL 25 MG PO CAPS
25.0000 mg | ORAL_CAPSULE | Freq: Once | ORAL | Status: AC
  Administered 2022-02-14: 25 mg via ORAL
  Filled 2022-02-14: qty 1

## 2022-02-14 MED ORDER — BUPROPION HCL ER (XL) 300 MG PO TB24
300.0000 mg | ORAL_TABLET | Freq: Every day | ORAL | Status: DC
  Administered 2022-02-15 – 2022-02-20 (×6): 300 mg via ORAL
  Filled 2022-02-14 (×6): qty 1

## 2022-02-14 MED ORDER — MOMETASONE FURO-FORMOTEROL FUM 200-5 MCG/ACT IN AERO
2.0000 | INHALATION_SPRAY | Freq: Two times a day (BID) | RESPIRATORY_TRACT | Status: DC
Start: 2022-02-14 — End: 2022-02-20
  Filled 2022-02-14: qty 8.8

## 2022-02-14 MED ORDER — ENSURE ENLIVE PO LIQD: 237.0000 mL | Freq: Two times a day (BID) | ORAL | Status: DC

## 2022-02-14 MED ORDER — HYDROMORPHONE HCL 2 MG PO TABS
4.0000 mg | ORAL_TABLET | ORAL | Status: DC | PRN
Start: 2022-02-14 — End: 2022-02-15
  Administered 2022-02-14 (×3): 4 mg via ORAL
  Filled 2022-02-14 (×3): qty 2

## 2022-02-14 MED ORDER — VANCOMYCIN HCL 1500 MG/300ML IV SOLN
1500.0000 mg | INTRAVENOUS | Status: DC
  Administered 2022-02-14 – 2022-02-15 (×2): 1500 mg via INTRAVENOUS
  Filled 2022-02-14 (×3): qty 300

## 2022-02-14 MED ORDER — SODIUM CHLORIDE 0.9 % IV SOLN
2.0000 g | Freq: Once | INTRAVENOUS | Status: AC
  Administered 2022-02-14: 2 g via INTRAVENOUS
  Filled 2022-02-14: qty 2

## 2022-02-14 MED ORDER — OXYCODONE HCL 5 MG PO TABS: 10.0000 mg | ORAL_TABLET | ORAL | Status: DC | PRN

## 2022-02-14 MED ORDER — ENOXAPARIN SODIUM 40 MG/0.4ML IJ SOSY
40.0000 mg | PREFILLED_SYRINGE | INTRAMUSCULAR | Status: DC
  Administered 2022-02-14 – 2022-02-20 (×7): 40 mg via SUBCUTANEOUS
  Filled 2022-02-14 (×7): qty 0.4
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
## 2022-02-14 NOTE — TOC Initial Note (Signed)
Transition of Care (TOC) - Initial/Assessment Note  ? ? ?Patient Details  ?Name: Claire [name] ?MRN: [id number] ?Date of Birth: 11-18-1986 ? ?Transition of Care (TOC) CM/SW Contact:    ?Cecille Po, RN ?Phone Number: ?02/14/2022, 2:27 PM ? ?Clinical Narrative:                 ? ?Transition of Care Department [facility]) has reviewed patient and no TOC needs have been identified at this time. We will continue to monitor patient advancement through interdisciplinary progression rounds. If new patient transition needs arise, please place a TOC consult. ?  ? ?Expected Discharge Plan: Home/Self Care ?Barriers to Discharge: Continued Medical Work up ? ? ?Expected Discharge Plan and Services ?Expected Discharge Plan: Home/Self Care ?  ?  ?  ?Living arrangements for the past 2 months: Single Family Home ?                ?  ?  ?Prior Living Arrangements/Services ?Living arrangements for the past 2 months: Single Family Home ?Lives with:: Friends ?Patient language and need for interpreter reviewed:: Yes ?       ?Need for Family Participation in Patient Care: No (Comment) ?Care giver support system in place?: No (comment) ?  ?Criminal Activity/Legal Involvement Pertinent to Current Situation/Hospitalization: No - Comment as needed ? ?Activities of Daily Living ?Home Assistive Devices/Equipment: Dentures (specify type), Eyeglasses, Contact lenses ?ADL Screening (condition at time of admission) ?Patient's cognitive ability adequate to safely complete daily activities?: Yes ?Is the patient deaf or have difficulty hearing?: No ?Does the patient have difficulty seeing, even when wearing glasses/contacts?: No ?Does the patient have difficulty concentrating, remembering, or making decisions?: No ?Patient able to express need for assistance with ADLs?: Yes ?Does the patient have difficulty dressing or bathing?: No ?Independently performs ADLs?: Yes (appropriate for developmental age) ?Does the patient have difficulty walking or  climbing stairs?: No ?Weakness of Legs: None ?Weakness of Arms/Hands: Left ? ?  ?Orientation: : Oriented to Self, Oriented to Place, Oriented to  Time, Oriented to Situation ?Alcohol / Substance Use: Illicit Drugs ?Psych Involvement: No (comment) ? ?Admission diagnosis:  Cellulitis [L03.90] ?Cellulitis, unspecified cellulitis site [L03.90] ?Sepsis, due to unspecified organism, unspecified whether acute organ dysfunction present (HCC) [A41.9] ?Patient Active Problem List  ? Diagnosis Date Noted  ? Cellulitis 02/14/2022  ? AKI (acute kidney injury) (HCC) 02/14/2022  ? GAD (generalized anxiety disorder) 02/14/2022  ? MDD (major depressive disorder) 02/14/2022  ? Elevated LFTs 02/14/2022  ? SIRS (systemic inflammatory response syndrome) (HCC) 02/14/2022  ? IV drug abuse (HCC) 02/14/2022  ? Severe recurrent major depression without psychotic features (HCC) 03/02/2021  ? 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 Morbid obesity (HCC) 12/22/2011  ? DYSPNEA 04/19/2008  ? HYPERTENSION 03/19/2008  ? ALLERGIC RHINITIS 03/19/2008  ? BRONCHITIS 03/19/2008  ? Asthma, chronic 03/19/2008  ? GERD 03/19/2008  ? MONONUCLEOSIS 03/18/2008  ? SYNCOPE, HX OF 03/18/2008  ? ?PCP:  Leo Grosser, MD ?Pharmacy:   ?[facility] [place] - [place], [place] - [address] ?[address] ?[place] [address] ?Phone: [phone] Fax: [phone] ? ?[facility] - [place], [place] - [address] ?[address] ?[place] [address] ?Phone: [phone] Fax: [phone] ? ?Readmission Risk Interventions ?No flowsheet data found. ? ? ?

## 2022-02-14 NOTE — Progress Notes (Signed)
Pharmacy Antibiotic Note ? ?Claire [name] is a 36 y.o. female admitted on 02/13/2022 with L arm cellulitis, hx substance abuse (Meth inj).  Pharmacy has been consulted for Vancomycin & Cefepime dosing. ? ?Plan: ?Cefepime 2gm q8 ?Vancomycin total 2gm loading dose ordered - 1gm only given at ED, continue with 1500mg  q24 ? ?Height: 5\' 10"  (177.8 cm) ?Weight: 83.9 kg (185 lb) ?IBW/kg (Calculated) : 68.5 ? ?Temp (24hrs), Avg:100.1 ?F (37.8 ?C), Min:98.3 ?F (36.8 ?C), Max:101.6 ?F (38.7 ?C) ? ?Recent Labs  ?Lab 02/13/22 ?2059  ?WBC 18.5*  ?CREATININE 1.40*  ?LATICACIDVEN 1.6  ?  ?Estimated Creatinine Clearance: 65.5 mL/min (A) (by C-G formula based on SCr of 1.4 mg/dL (H)).   ? ?Allergies  ?Allergen Reactions  ? Gadolinium Derivatives Anaphylaxis and Rash  ? Morphine And Related Itching  ? ? ?Antimicrobials this admission: ?3/19 Vanc/Zosyn x1 in ED ?3/19 Vancomycin >>  ?3/19 Cefepime >>  ? ?Dose adjustments this admission: ? ?Microbiology results: ?3/19 BCx: sent ? ?Thank you for allowing pharmacy to be a part of this patient?s care. ? ?Minda Ditto PharmD ?02/14/2022 7:49 AM ? ?

## 2022-02-14 NOTE — Progress Notes (Signed)
Plan of Care Note for accepted transfer ? ? ?Patient: Claire [name] MRN: [id number]   DOA: 02/13/2022 ? ?Facility requesting transfer: [facility] ED ?Requesting Provider: Dr. Pilar Plate ?Reason for transfer: Cellulitis ?Facility course: 36 year old female with history of IV drug use presenting with redness, swelling, and pain of her left arm.  She injected methamphetamine into this arm 2 days ago.  Slightly tachycardic and temperature 100.1 ?F.  WBC 18.5.  Lactate normal.  Creatinine 1.4, previously 0.8.  CT showing findings consistent with cellulitis and no abscess or deep tissue infection.  Patient was given antibiotics, IV fluids, and IV analgesics. ? ?Plan of care: ?The patient is accepted for admission to Telemetry unit, at [facility]..  ? ?Author: ?John Giovanni, MD ?02/14/2022 ? ?Check [email or web address] for on-call coverage. ? ?Nursing staff, Please call [facility] Admits & Consults System-Wide number on Amion as soon as patient's arrival, so appropriate admitting provider can evaluate the pt. ?

## 2022-02-15 DIAGNOSIS — R768 Other specified abnormal immunological findings in serum: Secondary | ICD-10-CM

## 2022-02-15 DIAGNOSIS — R7989 Other specified abnormal findings of blood chemistry: Secondary | ICD-10-CM

## 2022-02-15 DIAGNOSIS — L03114 Cellulitis of left upper limb: Secondary | ICD-10-CM | POA: Diagnosis not present

## 2022-02-15 DIAGNOSIS — F1910 Other psychoactive substance abuse, uncomplicated: Secondary | ICD-10-CM

## 2022-02-15 DIAGNOSIS — J45909 Unspecified asthma, uncomplicated: Secondary | ICD-10-CM

## 2022-02-15 DIAGNOSIS — N179 Acute kidney failure, unspecified: Secondary | ICD-10-CM

## 2022-02-15 DIAGNOSIS — F411 Generalized anxiety disorder: Secondary | ICD-10-CM

## 2022-02-15 DIAGNOSIS — F329 Major depressive disorder, single episode, unspecified: Secondary | ICD-10-CM

## 2022-02-15 DIAGNOSIS — D72829 Elevated white blood cell count, unspecified: Secondary | ICD-10-CM

## 2022-02-15 LAB — CBC
HCT: 36 % (ref 36.0–46.0)
Hemoglobin: 11.4 g/dL — ABNORMAL LOW (ref 12.0–15.0)
MCH: 29.8 pg (ref 26.0–34.0)
MCHC: 31.7 g/dL (ref 30.0–36.0)
MCV: 94.2 fL (ref 80.0–100.0)
Platelets: 210 10*3/uL (ref 150–400)
RBC: 3.82 MIL/uL — ABNORMAL LOW (ref 3.87–5.11)
RDW: 13.2 % (ref 11.5–15.5)
WBC: 8.3 10*3/uL (ref 4.0–10.5)
nRBC: 0 % (ref 0.0–0.2)

## 2022-02-15 LAB — COMPREHENSIVE METABOLIC PANEL
ALT: 92 U/L — ABNORMAL HIGH (ref 0–44)
AST: 74 U/L — ABNORMAL HIGH (ref 15–41)
Albumin: 2.2 g/dL — ABNORMAL LOW (ref 3.5–5.0)
Alkaline Phosphatase: 797 U/L — ABNORMAL HIGH (ref 38–126)
Anion gap: 7 (ref 5–15)
BUN: 16 mg/dL (ref 6–20)
CO2: 19 mmol/L — ABNORMAL LOW (ref 22–32)
Calcium: 8.3 mg/dL — ABNORMAL LOW (ref 8.9–10.3)
Chloride: 105 mmol/L (ref 98–111)
Creatinine, Ser: 0.97 mg/dL (ref 0.44–1.00)
GFR, Estimated: >60 mL/min (ref >60)
Glucose, Bld: 85 mg/dL (ref 70–99)
Potassium: 4.5 mmol/L (ref 3.5–5.1)
Sodium: 131 mmol/L — ABNORMAL LOW (ref 135–145)
Total Bilirubin: 0.6 mg/dL (ref 0.3–1.2)
Total Protein: 5.3 g/dL — ABNORMAL LOW (ref 6.5–8.1)

## 2022-02-15 MED ORDER — SENNOSIDES-DOCUSATE SODIUM 8.6-50 MG PO TABS
1.0000 | ORAL_TABLET | Freq: Two times a day (BID) | ORAL | Status: DC
  Administered 2022-02-15 – 2022-02-20 (×9): 1 via ORAL
  Filled 2022-02-15 (×11): qty 1

## 2022-02-15 MED ORDER — HYDROMORPHONE HCL 2 MG PO TABS
2.0000 mg | ORAL_TABLET | Freq: Four times a day (QID) | ORAL | Status: DC | PRN
  Administered 2022-02-18: 2 mg via ORAL
  Filled 2022-02-15: qty 1

## 2022-02-15 MED ORDER — BISACODYL 10 MG RE SUPP: 10.0000 mg | Freq: Every day | RECTAL | Status: DC | PRN

## 2022-02-15 MED ORDER — ENSURE MAX PROTEIN PO LIQD
11.0000 [oz_av] | Freq: Every day | ORAL | Status: DC
  Administered 2022-02-17 – 2022-02-19 (×3): 11 [oz_av] via ORAL
  Filled 2022-02-15 (×6): qty 330

## 2022-02-15 MED ORDER — BOOST / RESOURCE BREEZE PO LIQD CUSTOM
1.0000 | ORAL | Status: DC
Start: 2022-02-16 — End: 2022-02-20
  Administered 2022-02-17 – 2022-02-20 (×4): 1 via ORAL

## 2022-02-15 MED ORDER — OXYCODONE HCL 5 MG PO TABS
5.0000 mg | ORAL_TABLET | ORAL | Status: DC | PRN
  Administered 2022-02-15 – 2022-02-16 (×4): 10 mg via ORAL
  Administered 2022-02-16 – 2022-02-17 (×4): 5 mg via ORAL
  Administered 2022-02-18: 10 mg via ORAL
  Administered 2022-02-18 (×2): 5 mg via ORAL
  Administered 2022-02-19 – 2022-02-20 (×3): 10 mg via ORAL
  Filled 2022-02-15: qty 2
  Filled 2022-02-15: qty 1
  Filled 2022-02-15: qty 2
  Filled 2022-02-15 (×2): qty 1
  Filled 2022-02-15 (×5): qty 2
  Filled 2022-02-15 (×3): qty 1
  Filled 2022-02-15: qty 2

## 2022-02-15 MED ORDER — TIZANIDINE HCL 4 MG PO TABS: 4.0000 mg | ORAL_TABLET | Freq: Three times a day (TID) | ORAL | Status: DC | PRN

## 2022-02-15 MED ORDER — POLYETHYLENE GLYCOL 3350 17 G PO PACK: 17.0000 g | PACK | Freq: Every day | ORAL | Status: DC | PRN

## 2022-02-15 NOTE — Plan of Care (Signed)
  Problem: Education: Goal: Knowledge of General Education information will improve Description Including pain rating scale, medication(s)/side effects and non-pharmacologic comfort measures Outcome: Progressing   Problem: Health Behavior/Discharge Planning: Goal: Ability to manage health-related needs will improve Outcome: Progressing   

## 2022-02-15 NOTE — Progress Notes (Signed)
Initial Nutrition Assessment ? ?DOCUMENTATION CODES:  ? ?Not applicable ? ?INTERVENTION:  ?- will order Boost Breeze once/day, each supplement provides 250 kcal and 9 grams of protein. ?- will order Ensure Max once/day, each supplement provides 150 kcal and 30 grams of protein. ?- complete NFPE when feasible.  ? ? ?NUTRITION DIAGNOSIS:  ? ?Increased nutrient needs related to acute illness as evidenced by estimated needs. ? ?GOAL:  ? ?Patient will meet greater than or equal to 90% of their needs ? ?MONITOR:  ? ?PO intake, Supplement acceptance, Labs, Weight trends ? ?REASON FOR ASSESSMENT:  ? ?Malnutrition Screening Tool ? ?ASSESSMENT:  ? ?36 y.o. female with medical history of IVDA, GAD, MDD, asthma, GERD, chronic back pain, fibromyalgia, arthritis, and osteomyelitis. She presented to the ED due to L arm swelling, redness, and pain. CT of LUE showed findings compatible with cellulitis and she was started on IV abx. Patient admitted due to cellulitis and AKI. ? ?Unable to see patient at this time. Flow sheet documentation indicates that patient consumed 50% of lunch and 50% of dinner yesterday (total of 1904 kcal and 55 grams protein). Review of Health Touch indicates that she did not order breakfast this AM but had ordered lunch; unsure of how much of meal was consumed. ? ?Review of MST score indicates that patient denied any loss of or decrease in appetite recently but did report unintentional weight loss. ? ?Weight on 3/18 was documented as 185 lb although this does appear to be a stated weight. PTA the most recently documented weights were 184 lb at [facility] on 02/09/22, 160 lb at [facility] on 03/01/21, 163 lb at [facility] on 01/11/21, and 199 lb at [facility] on 12/21/18. ? ?Deep pitting edema to LUE documented in the edema section of flow sheet. ? ?Per notes: ?-LUE cellulitis ?- SIRS ?- AKI ?- elevated LFTs with hepatitis C antibody positive  ?- ongoing IVDA ?- to remain inpatient d/t need for IV abx ? ? ?Labs  reviewed; Na: 131 mmol/l, Ca: 8.3 mg/dl, Alk Phos significantly elevated, LFTs elevated. ?Medications reviewed; 1 tablet senokot BID. ?  ? ?NUTRITION - FOCUSED PHYSICAL EXAM: ? ?Unable to complete at this time.  ? ?Diet Order:   ?Diet Order   ? ?       ?  Diet regular Room service appropriate? Yes; Fluid consistency: Thin  Diet effective now       ?  ? ?  ?  ? ?  ? ? ?EDUCATION NEEDS:  ? ?No education needs have been identified at this time ? ?Skin:  Skin Assessment: Reviewed RN Assessment ? ?Last BM:  PTA/unknown ? ?Height:  ? ?Ht Readings from Last 1 Encounters:  ?02/13/22 $RemoveBe'5\' 10"'pBkeZvCxD$  (1.778 m)  ? ? ?Weight:  ? ?Wt Readings from Last 1 Encounters:  ?02/13/22 83.9 kg  ? ? ?BMI:  Body mass index is 26.54 kg/m?. ? ?Estimated Nutritional Needs:  ?Kcal:  2200-2400 kcal ?Protein:  105-120 grams ?Fluid:  >/= 2.3 L/day ? ? ? ? ? ?Jarome Matin, MS, RD, LDN ?Registered Dietitian II ?Inpatient Clinical Nutrition ?RD pager # and on-call/weekend pager # available in [place]  ? ?

## 2022-02-15 NOTE — Progress Notes (Signed)
?PROGRESS NOTE ? ? ? ?Claire CottaHeather D Barto  ZOX:[id number] DOB: Dec 15, 1985 DOA: 02/13/2022 ?PCP: Leo GrosserWhitt, Donna, MD  ? ?Brief Narrative:  ?36 y.o. female with medical history significant of IVDA, GAD, MDD presented with left arm swelling, redness and pain.  On presentation, WBCs were 18.5.  CT left upper extremity showed findings compatible with cellulitis with no fluid collection.  She was started on IV antibiotics. ? ?Assessment & Plan: ?  ?Left upper extremity cellulitis ?SIRS ?-Most likely secondary to IV drug use ?-Left upper extremity is still swollen with significant erythema and tenderness.  CT on presentation did not show any fluid collection ?-Continue broad-spectrum antibiotics.  Follow cultures. ? ?Acute kidney injury ?Acute metabolic acidosis ?-Improving.  Treated with IV fluids ? ?Hyponatremia ?-Mild.  Encourage oral intake ? ?Leukocytosis ?-Resolved ? ?Elevated LFTs ?Hep C antibody positive ?-Trend LFTs, slightly trending upwards.  Hep C antibody positive: Patient possibly has chronic hep C because of illicit drug use.  Will need outpatient ID evaluation and follow-up. ? ?IVDA ?-Patient was counseled regarding abstinence from admitting hospitalist.  [facility] ? ?GAD ?MDD ?-Continue home regimen.  Outpatient follow-up with psychiatry ? ?Asthma ?-Controlled.  Continue home regimen ? ?DVT prophylaxis: Lovenox ?Code Status: Full ?Family Communication: None at bedside ?Disposition Plan: ?Status is: Inpatient ?Remains inpatient appropriate because: Of severity of illness.  Need for IV antibiotics. ? ?Consultants: None ? ?Procedures: None ? ?Antimicrobials:  ?Anti-infectives (From admission, onward)  ? ? Start     Dose/Rate Route Frequency Ordered Stop  ? 02/14/22 1700  ceFEPIme (MAXIPIME) 2 g in sodium chloride 0.9 % 100 mL IVPB       ? 2 g ?200 mL/hr over 30 Minutes Intravenous Every 8 hours 02/14/22 0845    ? 02/14/22 1400  vancomycin (VANCOREADY) IVPB 1500 mg/300 mL       ? 1,500 mg ?150 mL/hr over 120  Minutes Intravenous Every 24 hours 02/14/22 0850    ? 02/14/22 0900  ceFEPIme (MAXIPIME) 2 g in sodium chloride 0.9 % 100 mL IVPB       ? 2 g ?200 mL/hr over 30 Minutes Intravenous  Once 02/14/22 0728 02/14/22 0943  ? 02/14/22 0000  vancomycin (VANCOCIN) IVPB 1000 mg/200 mL premix       ? 1,000 mg ?200 mL/hr over 60 Minutes Intravenous Every hour 02/13/22 2349 02/14/22 0159  ? 02/13/22 2330  piperacillin-tazobactam (ZOSYN) IVPB 3.375 g       ? 3.375 g ?100 mL/hr over 30 Minutes Intravenous  Once 02/13/22 2319 02/14/22 0329  ? ?  ? ? ? ?Subjective: ?Patient seen and examined at bedside.  Poor historian.  Still complains of left upper extremity swelling and pain.  No fever, vomiting, chest pain reported. ? ?Objective: ?Vitals:  ? 02/14/22 1721 02/14/22 2144 02/14/22 2314 02/15/22 0550  ?BP: 128/72 136/90  128/72  ?Pulse: 99 (!) 101  98  ?Resp: 16 20  20   ?Temp: 98.7 ?F (37.1 ?C) 100.2 ?F (37.9 ?C) 98.4 ?F (36.9 ?C) 98.4 ?F (36.9 ?C)  ?TempSrc: Oral Oral Oral Oral  ?SpO2: 97% 96%  100%  ?Weight:      ?Height:      ? ? ?Intake/Output Summary (Last 24 hours) at 02/15/2022 1118 ?Last data filed at 02/15/2022 0400 ?Gross per 24 hour  ?Intake 1541.44 ml  ?Output --  ?Net 1541.44 ml  ? ?Filed Weights  ? 02/13/22 2036  ?Weight: 83.9 kg  ? ? ?Examination: ? ?General exam: Appears calm and comfortable.  On  room air currently. ?Respiratory system: Bilateral decreased breath sounds at bases ?Cardiovascular system: S1 & S2 heard, Rate controlled ?Gastrointestinal system: Abdomen is nondistended, soft and nontender. Normal bowel sounds heard. ?Extremities: No cyanosis, clubbing; left upper extremity edema present ?Central nervous system: Sleepy, wakes up slightly, poor historian. No focal neurological deficits. Moving extremities ?Skin: Left arm and forearm swelling, erythema and tenderness present.  No discharge. ?Psychiatry: Affect is mostly flat.  Does not participate in conversation much.  No signs of agitation. ? ? ?Data  Reviewed: I have personally reviewed following labs and imaging studies ? ?CBC: ?Recent Labs  ?Lab 02/13/22 ?2059 02/14/22 ?9470 02/15/22 ?0612  ?WBC 18.5* 12.0* 8.3  ?NEUTROABS 16.9*  --   --   ?HGB 14.6 12.0 11.4*  ?HCT 45.2 36.4 36.0  ?MCV 93.2 93.1 94.2  ?PLT 237 198 210  ? ?Basic Metabolic Panel: ?Recent Labs  ?Lab 02/13/22 ?2059 02/14/22 ?9628 02/15/22 ?0612  ?NA 133*  --  131*  ?K 3.8  --  4.5  ?CL 102  --  105  ?CO2 20*  --  19*  ?GLUCOSE 123*  --  85  ?BUN 22*  --  16  ?CREATININE 1.40* 1.05* 0.97  ?CALCIUM 9.3  --  8.3*  ? ?GFR: ?Estimated Creatinine Clearance: 94.6 mL/min (by C-G formula based on SCr of 0.97 mg/dL). ?Liver Function Tests: ?Recent Labs  ?Lab 02/13/22 ?2059 02/15/22 ?0612  ?AST 42* 74*  ?ALT 76* 92*  ?ALKPHOS 236* 797*  ?BILITOT 0.3 0.6  ?PROT 7.4 5.3*  ?ALBUMIN 3.6 2.2*  ? ?No results for input(s): LIPASE, AMYLASE in the last 168 hours. ?No results for input(s): AMMONIA in the last 168 hours. ?Coagulation Profile: ?Recent Labs  ?Lab 02/13/22 ?2059  ?INR 1.1  ? ?Cardiac Enzymes: ?No results for input(s): CKTOTAL, CKMB, CKMBINDEX, TROPONINI in the last 168 hours. ?BNP (last 3 results) ?No results for input(s): PROBNP in the last 8760 hours. ?HbA1C: ?No results for input(s): HGBA1C in the last 72 hours. ?CBG: ?No results for input(s): GLUCAP in the last 168 hours. ?Lipid Profile: ?No results for input(s): CHOL, HDL, LDLCALC, TRIG, CHOLHDL, LDLDIRECT in the last 72 hours. ?Thyroid Function Tests: ?No results for input(s): TSH, T4TOTAL, FREET4, T3FREE, THYROIDAB in the last 72 hours. ?Anemia Panel: ?No results for input(s): VITAMINB12, FOLATE, FERRITIN, TIBC, IRON, RETICCTPCT in the last 72 hours. ?Sepsis Labs: ?Recent Labs  ?Lab 02/13/22 ?2059 02/14/22 ?0650  ?LATICACIDVEN 1.6 1.2  ? ? ?Recent Results (from the past 240 hour(s))  ?Culture, blood (Routine x 2)     Status: None (Preliminary result)  ? Collection Time: 02/13/22  9:10 PM  ? Specimen: BLOOD  ?Result Value Ref Range Status  ?  Specimen Description   Final  ?  BLOOD RIGHT ANTECUBITAL ?Performed at [facility], [address], [place], [place] [address] ?  ? Special Requests   Final  ?  BOTTLES DRAWN AEROBIC AND ANAEROBIC Blood Culture adequate volume ?Performed at [facility], [address], [place], [place] [address] ?  ? Culture   Final  ?  NO GROWTH < 24 HOURS ?Performed at [facility], [address], [place], [place] [address] ?  ? Report Status PENDING  Incomplete  ?Culture, blood (Routine x 2)     Status: None (Preliminary result)  ? Collection Time: 02/14/22  1:45 AM  ? Specimen: BLOOD  ?Result Value Ref Range Status  ? Specimen Description   Final  ?  BLOOD RIGHT ARM ?Performed  at [facility], [address], [place], [place] [address] ?  ? Special Requests   Final  ?  BOTTLES DRAWN AEROBIC AND ANAEROBIC Blood Culture adequate volume ?Performed at [facility], [address], [place], [place] [address] ?  ? Culture   Final  ?  NO GROWTH < 24 HOURS ?Performed at [facility], [address], [place], [place] [address] ?  ? Report Status PENDING  Incomplete  ?  ? ? ? ? ? ?Radiology Studies: ?US Abdomen Complete ? ?Result Date: 02/14/2022 ?CLINICAL DATA:  Abnormal liver function tests, renal dysfunction EXAM: ABDOMEN ULTRASOUND COMPLETE COMPARISON:  None. FINDINGS: Gallbladder: Gallbladder is not seen consistent with cholecystectomy. Common bile duct: Diameter: 4 mm Liver: No focal lesion identified. Within normal limits in parenchymal echogenicity. Portal vein is patent on color Doppler imaging with normal direction of blood flow towards the liver. IVC: No abnormality visualized. Pancreas: Visualized portion unremarkable. Spleen: Spleen is enlarged in size measuring proximally 13.4 cm in maximum diameter. Right Kidney: Length: 10.9 cm. Echogenicity within normal limits. No mass or hydronephrosis visualized. Left Kidney: Length: 10.6 cm.  Echogenicity within normal limits. No mass or hydronephrosis visualized. Abdominal aorta: No aneurysm visualized. Other findings: None. IMPRESSION: Enlarged spleen. No other significant sonographic abnormality is seen i

## 2022-02-16 DIAGNOSIS — D72829 Elevated white blood cell count, unspecified: Secondary | ICD-10-CM

## 2022-02-16 DIAGNOSIS — L03114 Cellulitis of left upper limb: Secondary | ICD-10-CM | POA: Diagnosis not present

## 2022-02-16 DIAGNOSIS — J45909 Unspecified asthma, uncomplicated: Secondary | ICD-10-CM | POA: Diagnosis not present

## 2022-02-16 DIAGNOSIS — R7989 Other specified abnormal findings of blood chemistry: Secondary | ICD-10-CM | POA: Diagnosis not present

## 2022-02-16 DIAGNOSIS — N179 Acute kidney failure, unspecified: Secondary | ICD-10-CM | POA: Diagnosis not present

## 2022-02-16 DIAGNOSIS — R768 Other specified abnormal immunological findings in serum: Secondary | ICD-10-CM

## 2022-02-16 LAB — COMPREHENSIVE METABOLIC PANEL
ALT: 76 U/L — ABNORMAL HIGH (ref 0–44)
AST: 28 U/L (ref 15–41)
Albumin: 2.4 g/dL — ABNORMAL LOW (ref 3.5–5.0)
Alkaline Phosphatase: 924 U/L — ABNORMAL HIGH (ref 38–126)
Anion gap: 8 (ref 5–15)
BUN: 14 mg/dL (ref 6–20)
CO2: 21 mmol/L — ABNORMAL LOW (ref 22–32)
Calcium: 8.6 mg/dL — ABNORMAL LOW (ref 8.9–10.3)
Chloride: 106 mmol/L (ref 98–111)
Creatinine, Ser: 0.81 mg/dL (ref 0.44–1.00)
GFR, Estimated: >60 mL/min (ref >60)
Glucose, Bld: 73 mg/dL (ref 70–99)
Potassium: 4.7 mmol/L (ref 3.5–5.1)
Sodium: 135 mmol/L (ref 135–145)
Total Bilirubin: 0.4 mg/dL (ref 0.3–1.2)
Total Protein: 6.3 g/dL — ABNORMAL LOW (ref 6.5–8.1)

## 2022-02-16 LAB — CBC WITH DIFFERENTIAL/PLATELET
Abs Immature Granulocytes: 0.06 10*3/uL (ref 0.00–0.07)
Basophils Absolute: 0 10*3/uL (ref 0.0–0.1)
Basophils Relative: 0 %
Eosinophils Absolute: 0.1 10*3/uL (ref 0.0–0.5)
Eosinophils Relative: 2 %
HCT: 43 % (ref 36.0–46.0)
Hemoglobin: 13.7 g/dL (ref 12.0–15.0)
Immature Granulocytes: 1 %
Lymphocytes Relative: 18 %
Lymphs Abs: 1.4 10*3/uL (ref 0.7–4.0)
MCH: 30 pg (ref 26.0–34.0)
MCHC: 31.9 g/dL (ref 30.0–36.0)
MCV: 94.3 fL (ref 80.0–100.0)
Monocytes Absolute: 0.5 10*3/uL (ref 0.1–1.0)
Monocytes Relative: 6 %
Neutro Abs: 6 10*3/uL (ref 1.7–7.7)
Neutrophils Relative %: 73 %
Platelets: 260 10*3/uL (ref 150–400)
RBC: 4.56 MIL/uL (ref 3.87–5.11)
RDW: 13.3 % (ref 11.5–15.5)
WBC: 8.1 10*3/uL (ref 4.0–10.5)
nRBC: 0 % (ref 0.0–0.2)

## 2022-02-16 LAB — C-REACTIVE PROTEIN: CRP: 23.1 mg/dL — ABNORMAL HIGH (ref <1.0)

## 2022-02-16 LAB — MAGNESIUM: Magnesium: 2 mg/dL (ref 1.7–2.4)

## 2022-02-16 MED ORDER — VANCOMYCIN HCL 1250 MG/250ML IV SOLN
1250.0000 mg | Freq: Two times a day (BID) | INTRAVENOUS | Status: DC
Start: 2022-02-16 — End: 2022-02-20
  Administered 2022-02-16 – 2022-02-20 (×9): 1250 mg via INTRAVENOUS
  Filled 2022-02-16 (×10): qty 250

## 2022-02-16 NOTE — Plan of Care (Signed)
?  Problem: Clinical Measurements: ?Goal: Ability to maintain clinical measurements within normal limits will improve ?Outcome: Progressing ?Goal: Will remain free from infection ?Outcome: Progressing ?Goal: Diagnostic test results will improve ?Outcome: Progressing ?  ?

## 2022-02-16 NOTE — Plan of Care (Signed)
  Problem: Safety: Goal: Ability to remain free from injury will improve Outcome: Progressing   Problem: Skin Integrity: Goal: Risk for impaired skin integrity will decrease Outcome: Progressing   Problem: Pain Managment: Goal: General experience of comfort will improve Outcome: Not Progressing   

## 2022-02-16 NOTE — Progress Notes (Signed)
Pharmacy Antibiotic Note ? ?Claire [name] is a 36 y.o. female admitted on 02/13/2022 with L arm cellulitis, hx substance abuse (Meth inj).  Pharmacy has been consulted for Vancomycin & Cefepime dosing. ? ?D/w MD - noted that cellulitis is not improving and would like to continue IV antibiotics for now.  ? ?Plan: ?Continue cefepime 2gm q8 hours ?Adjust vancomycin to 1250 mg IV q12 hours since AKI has resolved (new eAUC 456, Scr 0.81)  ? ?Height: 5\' 10"  (177.8 cm) ?Weight: 83.9 kg (185 lb) ?IBW/kg (Calculated) : 68.5 ? ?Temp (24hrs), Avg:98.5 ?F (36.9 ?C), Min:98 ?F (36.7 ?C), Max:99 ?F (37.2 ?C) ? ?Recent Labs  ?Lab 02/13/22 ?2059 02/14/22 ?0650 02/14/22 ?02/16/22 02/15/22 ?0612 02/16/22 ?0345  ?WBC 18.5*  --  12.0* 8.3 8.1  ?CREATININE 1.40*  --  1.05* 0.97 0.81  ?LATICACIDVEN 1.6 1.2  --   --   --   ? ?  ?Estimated Creatinine Clearance: 113.2 mL/min (by C-G formula based on SCr of 0.81 mg/dL).   ? ?Allergies  ?Allergen Reactions  ? Gadolinium Derivatives Anaphylaxis and Rash  ? Morphine And Related Itching  ?  "If given with benadryl its fine"  ? ? ?Antimicrobials this admission: ?3/19 Vanc/Zosyn x1 in ED ?3/19 Vancomycin >>  ?3/19 Cefepime >>  ? ?Dose adjustments this admission: ? ?Microbiology results: ?3/19 BCx: ngtd ? ?Thank you for allowing pharmacy to be a part of this patient?s care. ? ?4/19, PharmD ?02/16/2022 10:00 AM ? ?

## 2022-02-16 NOTE — Progress Notes (Signed)
?PROGRESS NOTE ? ? ? ?Claire [name]  TML:[id number] DOB: 1986/06/08 DOA: 02/13/2022 ?PCP: Leo Grosser, MD  ? ?Brief Narrative:  ?36 y.o. female with medical history significant of IVDA, GAD, MDD presented with left arm swelling, redness and pain.  On presentation, WBCs were 18.5.  CT left upper extremity showed findings compatible with cellulitis with no fluid collection.  She was started on IV antibiotics. ? ?Assessment & Plan: ?  ?Left upper extremity cellulitis ?SIRS ?-Most likely secondary to IV drug use ?-Left upper extremity is still swollen with significant erythema and tenderness.  CT on presentation did not show any fluid collection ?-Continue broad-spectrum antibiotics.  Cultures negative so far. ? ?Acute kidney injury ?Acute metabolic acidosis ?-Improving.  Treated with IV fluids ? ?Hyponatremia ?-Resolved. ? ?Leukocytosis ?-Resolved ? ?Elevated LFTs ?Hep C antibody positive ?-Trend LFTs, slightly trending upwards.  Hep C antibody positive: Patient possibly has chronic hep C because of illicit drug use.  Will need outpatient ID evaluation and follow-up. ? ?IVDA ?-Patient was counseled regarding abstinence from admitting hospitalist.  [facility] consult ? ?GAD ?MDD ?-Continue home regimen.  Outpatient follow-up with psychiatry ? ?Asthma ?-Controlled.  Continue home regimen ? ?DVT prophylaxis: Lovenox ?Code Status: Full ?Family Communication: None at bedside ?Disposition Plan: ?Status is: Inpatient ?Remains inpatient appropriate because: Of severity of illness.  Need for IV antibiotics. ? ?Consultants: None ? ?Procedures: None ? ?Antimicrobials:  ?Anti-infectives (From admission, onward)  ? ? Start     Dose/Rate Route Frequency Ordered Stop  ? 02/14/22 1700  ceFEPIme (MAXIPIME) 2 g in sodium chloride 0.9 % 100 mL IVPB       ? 2 g ?200 mL/hr over 30 Minutes Intravenous Every 8 hours 02/14/22 0845    ? 02/14/22 1400  vancomycin (VANCOREADY) IVPB 1500 mg/300 mL       ? 1,500 mg ?150 mL/hr over 120 Minutes  Intravenous Every 24 hours 02/14/22 0850    ? 02/14/22 0900  ceFEPIme (MAXIPIME) 2 g in sodium chloride 0.9 % 100 mL IVPB       ? 2 g ?200 mL/hr over 30 Minutes Intravenous  Once 02/14/22 0728 02/14/22 0943  ? 02/14/22 0000  vancomycin (VANCOCIN) IVPB 1000 mg/200 mL premix       ? 1,000 mg ?200 mL/hr over 60 Minutes Intravenous Every hour 02/13/22 2349 02/14/22 0159  ? 02/13/22 2330  piperacillin-tazobactam (ZOSYN) IVPB 3.375 g       ? 3.375 g ?100 mL/hr over 30 Minutes Intravenous  Once 02/13/22 2319 02/14/22 0329  ? ?  ? ? ? ?Subjective: ?Patient seen and examined at bedside.  Poor historian.  No overnight chest pain, shortness of breath, fever or vomiting reported.  Still complains of left upper extremity pain. ?Objective: ?Vitals:  ? 02/14/22 2314 02/15/22 0550 02/15/22 2038 02/16/22 0515  ?BP:  128/72 130/87 120/72  ?Pulse:  98 79 89  ?Resp:  20 20 20   ?Temp: 98.4 ?F (36.9 ?C) 98.4 ?F (36.9 ?C) 98 ?F (36.7 ?C) 99 ?F (37.2 ?C)  ?TempSrc: Oral Oral Oral Oral  ?SpO2:  100% 100% 96%  ?Weight:      ?Height:      ? ?No intake or output data in the 24 hours ending 02/16/22 0809 ? ?Filed Weights  ? 02/13/22 2036  ?Weight: 83.9 kg  ? ? ?Examination: ? ?General exam: No distress.  On room air currently.   ?Respiratory system: Decreased breath sounds at bases bilaterally with some scattered rales ?Cardiovascular system: Rate controlled, S1-S2  heard  ?gastrointestinal system: Abdomen is distended slightly; soft and nontender.  Bowel sounds are heard  ?extremities: Left upper extremity edema is still present; no clubbing or cyanosis  ?Central nervous system: Poor historian; still slow to respond.  No focal neurological deficits.  Moves extremities ?Skin: Left arm and forearm swelling, erythema and tenderness are still present.  No discharge. ?Psychiatry: Flat affect.  Does not participate in conversation much.  Currently not agitated. ? ?Data Reviewed: I have personally reviewed following labs and imaging  studies ? ?CBC: ?Recent Labs  ?Lab 02/13/22 ?2059 02/14/22 ?5621 02/15/22 ?0612 02/16/22 ?0345  ?WBC 18.5* 12.0* 8.3 8.1  ?NEUTROABS 16.9*  --   --  6.0  ?HGB 14.6 12.0 11.4* 13.7  ?HCT 45.2 36.4 36.0 43.0  ?MCV 93.2 93.1 94.2 94.3  ?PLT 237 198 210 260  ? ? ?Basic Metabolic Panel: ?Recent Labs  ?Lab 02/13/22 ?2059 02/14/22 ?3086 02/15/22 ?0612 02/16/22 ?0345  ?NA 133*  --  131* 135  ?K 3.8  --  4.5 4.7  ?CL 102  --  105 106  ?CO2 20*  --  19* 21*  ?GLUCOSE 123*  --  85 73  ?BUN 22*  --  16 14  ?CREATININE 1.40* 1.05* 0.97 0.81  ?CALCIUM 9.3  --  8.3* 8.6*  ?MG  --   --   --  2.0  ? ? ?GFR: ?Estimated Creatinine Clearance: 113.2 mL/min (by C-G formula based on SCr of 0.81 mg/dL). ?Liver Function Tests: ?Recent Labs  ?Lab 02/13/22 ?2059 02/15/22 ?0612 02/16/22 ?0345  ?AST 42* 74* 28  ?ALT 76* 92* 76*  ?ALKPHOS 236* 797* 924*  ?BILITOT 0.3 0.6 0.4  ?PROT 7.4 5.3* 6.3*  ?ALBUMIN 3.6 2.2* 2.4*  ? ? ?No results for input(s): LIPASE, AMYLASE in the last 168 hours. ?No results for input(s): AMMONIA in the last 168 hours. ?Coagulation Profile: ?Recent Labs  ?Lab 02/13/22 ?2059  ?INR 1.1  ? ? ?Cardiac Enzymes: ?No results for input(s): CKTOTAL, CKMB, CKMBINDEX, TROPONINI in the last 168 hours. ?BNP (last 3 results) ?No results for input(s): PROBNP in the last 8760 hours. ?HbA1C: ?No results for input(s): HGBA1C in the last 72 hours. ?CBG: ?No results for input(s): GLUCAP in the last 168 hours. ?Lipid Profile: ?No results for input(s): CHOL, HDL, LDLCALC, TRIG, CHOLHDL, LDLDIRECT in the last 72 hours. ?Thyroid Function Tests: ?No results for input(s): TSH, T4TOTAL, FREET4, T3FREE, THYROIDAB in the last 72 hours. ?Anemia Panel: ?No results for input(s): VITAMINB12, FOLATE, FERRITIN, TIBC, IRON, RETICCTPCT in the last 72 hours. ?Sepsis Labs: ?Recent Labs  ?Lab 02/13/22 ?2059 02/14/22 ?0650  ?LATICACIDVEN 1.6 1.2  ? ? ? ?Recent Results (from the past 240 hour(s))  ?Culture, blood (Routine x 2)     Status: None (Preliminary  result)  ? Collection Time: 02/13/22  9:10 PM  ? Specimen: BLOOD  ?Result Value Ref Range Status  ? Specimen Description   Final  ?  BLOOD RIGHT ANTECUBITAL ?Performed at [facility], [address], [place], [place] [address] ?  ? Special Requests   Final  ?  BOTTLES DRAWN AEROBIC AND ANAEROBIC Blood Culture adequate volume ?Performed at [facility], [address] [place], [place] [address] ?  ? Culture   Final  ?  NO GROWTH 2 DAYS ?Performed at [facility], [address], [place], [place] [address] ?  ? Report Status PENDING  Incomplete  ?Culture, blood (Routine x 2)     Status: None (Preliminary result)  ?  Collection Time: 02/14/22  1:45 AM  ? Specimen: BLOOD  ?Result Value Ref Range Status  ? Specimen Description   Final  ?  BLOOD RIGHT ARM ?Performed at [facility], [address], [place], [place] [address] ?  ? Special Requests   Final  ?  BOTTLES DRAWN AEROBIC AND ANAEROBIC Blood Culture adequate volume ?Performed at [facility], [address], [place], [place] [address] ?  ? Culture   Final  ?  NO GROWTH 2 DAYS ?Performed at [facility], [address] [place], [place] [address] ?  ? Report Status PENDING  Incomplete  ? ?  ? ? ? ? ? ?Radiology Studies: ?US Abdomen Complete ? ?Result Date: 02/14/2022 ?CLINICAL DATA:  Abnormal liver function tests, renal dysfunction EXAM: ABDOMEN ULTRASOUND COMPLETE COMPARISON:  None. FINDINGS: Gallbladder: Gallbladder is not seen consistent with cholecystectomy. Common bile duct: Diameter: 4 mm Liver: No focal lesion identified. Within normal limits in parenchymal echogenicity. Portal vein is patent on color Doppler imaging with normal direction of blood flow towards the liver. IVC: No abnormality visualized. Pancreas: Visualized portion unremarkable. Spleen: Spleen is enlarged in size measuring proximally 13.4 cm in maximum diameter. Right Kidney: Length: 10.9 cm.  Echogenicity within normal limits. No mass or hydronephrosis visualized. Left Kidney: Length: 10.6 cm. Echogenicity within normal limits. No mass or hydronephrosis visualized. Abdominal aorta: No aneurysm visualized. Other findings: None. I

## 2022-02-17 DIAGNOSIS — F411 Generalized anxiety disorder: Secondary | ICD-10-CM | POA: Diagnosis not present

## 2022-02-17 DIAGNOSIS — R7989 Other specified abnormal findings of blood chemistry: Secondary | ICD-10-CM | POA: Diagnosis not present

## 2022-02-17 DIAGNOSIS — L03114 Cellulitis of left upper limb: Secondary | ICD-10-CM | POA: Diagnosis not present

## 2022-02-17 DIAGNOSIS — N179 Acute kidney failure, unspecified: Secondary | ICD-10-CM | POA: Diagnosis not present

## 2022-02-17 LAB — CBC WITH DIFFERENTIAL/PLATELET
Abs Immature Granulocytes: 0.07 10*3/uL (ref 0.00–0.07)
Basophils Absolute: 0.1 10*3/uL (ref 0.0–0.1)
Basophils Relative: 1 %
Eosinophils Absolute: 0.1 10*3/uL (ref 0.0–0.5)
Eosinophils Relative: 2 %
HCT: 41.8 % (ref 36.0–46.0)
Hemoglobin: 13.1 g/dL (ref 12.0–15.0)
Immature Granulocytes: 1 %
Lymphocytes Relative: 25 %
Lymphs Abs: 1.8 10*3/uL (ref 0.7–4.0)
MCH: 30 pg (ref 26.0–34.0)
MCHC: 31.3 g/dL (ref 30.0–36.0)
MCV: 95.7 fL (ref 80.0–100.0)
Monocytes Absolute: 0.5 10*3/uL (ref 0.1–1.0)
Monocytes Relative: 7 %
Neutro Abs: 4.6 10*3/uL (ref 1.7–7.7)
Neutrophils Relative %: 64 %
Platelets: 302 10*3/uL (ref 150–400)
RBC: 4.37 MIL/uL (ref 3.87–5.11)
RDW: 13.5 % (ref 11.5–15.5)
WBC: 7.1 10*3/uL (ref 4.0–10.5)
nRBC: 0 % (ref 0.0–0.2)

## 2022-02-17 LAB — COMPREHENSIVE METABOLIC PANEL
ALT: 46 U/L — ABNORMAL HIGH (ref 0–44)
AST: 13 U/L — ABNORMAL LOW (ref 15–41)
Albumin: 2.4 g/dL — ABNORMAL LOW (ref 3.5–5.0)
Alkaline Phosphatase: 690 U/L — ABNORMAL HIGH (ref 38–126)
Anion gap: 8 (ref 5–15)
BUN: 11 mg/dL (ref 6–20)
CO2: 20 mmol/L — ABNORMAL LOW (ref 22–32)
Calcium: 8.7 mg/dL — ABNORMAL LOW (ref 8.9–10.3)
Chloride: 107 mmol/L (ref 98–111)
Creatinine, Ser: 0.77 mg/dL (ref 0.44–1.00)
GFR, Estimated: >60 mL/min (ref >60)
Glucose, Bld: 78 mg/dL (ref 70–99)
Potassium: 4.5 mmol/L (ref 3.5–5.1)
Sodium: 135 mmol/L (ref 135–145)
Total Bilirubin: 0.2 mg/dL — ABNORMAL LOW (ref 0.3–1.2)
Total Protein: 6.1 g/dL — ABNORMAL LOW (ref 6.5–8.1)

## 2022-02-17 LAB — MAGNESIUM: Magnesium: 1.9 mg/dL (ref 1.7–2.4)

## 2022-02-17 LAB — CK: Total CK: 31 U/L — ABNORMAL LOW (ref 38–234)

## 2022-02-17 LAB — C-REACTIVE PROTEIN: CRP: 12 mg/dL — ABNORMAL HIGH (ref <1.0)

## 2022-02-17 MED ORDER — SODIUM CHLORIDE 0.9 % IV SOLN
2.0000 g | INTRAVENOUS | Status: DC
  Administered 2022-02-17 – 2022-02-20 (×4): 2 g via INTRAVENOUS
  Filled 2022-02-17 (×5): qty 20

## 2022-02-17 NOTE — Plan of Care (Signed)
?  Problem: Nutrition: ?Goal: Adequate nutrition will be maintained ?Outcome: Not Progressing ?  ?Problem: Pain Managment: ?Goal: General experience of comfort will improve ?Outcome: Not Progressing ?  ?Problem: Skin Integrity: ?Goal: Risk for impaired skin integrity will decrease ?Outcome: Not Progressing ?  ?Problem: Pain Managment: ?Goal: General experience of comfort will improve ?Outcome: Not Progressing ?  ?

## 2022-02-17 NOTE — Progress Notes (Addendum)
?PROGRESS NOTE ? ? ? ?Claire [name]  [id number] DOB: [birth year] DOA: 02/13/2022 ?PCP: Martie Lee, MD  ? ?Brief Narrative:  ?36 y.o. female with medical history significant of IVDA, GAD, MDD presented with left arm swelling, redness and pain.  On presentation, WBCs were 18.5.  CT left upper extremity showed findings compatible with cellulitis with no fluid collection.  She was started on IV antibiotics. ? ?Assessment & Plan: ?  ?Left upper extremity cellulitis ?SIRS ?-Most likely secondary to IV drug use ?-CT on presentation did not show any fluid collection ?-Continue broad-spectrum antibiotics.  Cultures negative so far.  CRP improving.  CK total not elevated ? ?Acute kidney injury ?Acute metabolic acidosis ?-Improving.  Treated with IV fluids ? ?Hyponatremia ?-Resolved. ? ?Leukocytosis ?-Resolved ? ?Elevated LFTs ?Hep C antibody positive ?-Trend LFTs, improving.  Hep C antibody positive: Patient possibly has chronic hep C because of illicit drug use.  Will need outpatient ID evaluation and follow-up. ? ?IVDA ?-Patient was counseled regarding abstinence from admitting hospitalist.  [facility] consult ? ?GAD ?MDD ?-Continue home regimen.  Outpatient follow-up with psychiatry ? ?Asthma ?-Controlled.  Continue home regimen ? ?DVT prophylaxis: Lovenox ?Code Status: Full ?Family Communication: spoke to mother on phone on 02/17/22 ?Disposition Plan: ?Status is: Inpatient ?Remains inpatient appropriate because: Of severity of illness.  Need for IV antibiotics. ? ?Consultants: None ? ?Procedures: None ? ?Antimicrobials:  ?Anti-infectives (From admission, onward)  ? ? Start     Dose/Rate Route Frequency Ordered Stop  ? 02/16/22 1000  vancomycin (VANCOREADY) IVPB 1250 mg/250 mL       ? 1,250 mg ?166.7 mL/hr over 90 Minutes Intravenous Every 12 hours 02/16/22 0959    ? 02/14/22 1700  ceFEPIme (MAXIPIME) 2 g in sodium chloride 0.9 % 100 mL IVPB       ? 2 g ?200 mL/hr over 30 Minutes Intravenous Every 8 hours 02/14/22 0845    ?  02/14/22 1400  vancomycin (VANCOREADY) IVPB 1500 mg/300 mL  Status:  Discontinued       ? 1,500 mg ?150 mL/hr over 120 Minutes Intravenous Every 24 hours 02/14/22 0850 02/16/22 0959  ? 02/14/22 0900  ceFEPIme (MAXIPIME) 2 g in sodium chloride 0.9 % 100 mL IVPB       ? 2 g ?200 mL/hr over 30 Minutes Intravenous  Once 02/14/22 0728 02/14/22 0943  ? 02/14/22 0000  vancomycin (VANCOCIN) IVPB 1000 mg/200 mL premix       ? 1,000 mg ?200 mL/hr over 60 Minutes Intravenous Every hour 02/13/22 2349 02/14/22 0159  ? 02/13/22 2330  piperacillin-tazobactam (ZOSYN) IVPB 3.375 g       ? 3.375 g ?100 mL/hr over 30 Minutes Intravenous  Once 02/13/22 2319 02/14/22 0329  ? ?  ? ? ? ?Subjective: ?Patient seen and examined at bedside.  Poor historian.  No fever, chest pain or worsening shortness of breath reported.  Still complains of left arm and forearm pain.   ?Objective: ?Vitals:  ? 02/15/22 2038 02/16/22 0515 02/16/22 1231 02/16/22 2023  ?BP: 130/87 120/72 130/62 (!) 140/93  ?Pulse: 79 89 85 86  ?Resp: 20 20 19 20   ?Temp: 98 ?F (36.7 ?C) 99 ?F (37.2 ?C) 97.7 ?F (36.5 ?C) 98.3 ?F (36.8 ?C)  ?TempSrc: Oral Oral Oral Oral  ?SpO2: 100% 96% 97% 100%  ?Weight:      ?Height:      ? ? ?Intake/Output Summary (Last 24 hours) at 02/17/2022 0726 ?Last data filed at 02/17/2022 0300 ?Gross per 24  hour  ?Intake 1298.81 ml  ?Output --  ?Net 1298.81 ml  ? ? ?Filed Weights  ? 02/13/22 2036  ?Weight: 83.9 kg  ? ? ?Examination: ? ?General exam: Currently on room air.  No acute distress. ?Respiratory system: Bilateral decreased breath sounds at bases with some crackles cardiovascular system: S1-S2 heard; currently rate controlled  ?gastrointestinal system: Abdomen is mildly distended; soft and nontender.  Normal bowel sounds are heard  ?extremities: No cyanosis; left upper extremity edema is present  ?Central nervous system: Still slow to respond; poor historian.  Wakes up slightly, answers some questions.  No focal neurological deficits.  Moving  extremities  ?skin: Still has significant left arm and forearm swelling, tenderness and erythema but improving.  No discharge.  Distal neurovascular status is intact. ?Psychiatry: No signs of agitation.  Affect is mostly flat.  Does not participate in conversation much. ? ?Data Reviewed: I have personally reviewed following labs and imaging studies ? ?CBC: ?Recent Labs  ?Lab 02/13/22 ?2059 02/14/22 ?[id number] 02/15/22 ?0612 02/16/22 ?0345 02/17/22 ?0418  ?WBC 18.5* 12.0* 8.3 8.1 7.1  ?NEUTROABS 16.9*  --   --  6.0 4.6  ?HGB 14.6 12.0 11.4* 13.7 13.1  ?HCT 45.2 36.4 36.0 43.0 41.8  ?MCV 93.2 93.1 94.2 94.3 95.7  ?PLT 237 198 210 260 302  ? ? ?Basic Metabolic Panel: ?Recent Labs  ?Lab 02/13/22 ?2059 02/14/22 ?[id number] 02/15/22 ?0612 02/16/22 ?0345 02/17/22 ?0418  ?NA 133*  --  131* 135 135  ?K 3.8  --  4.5 4.7 4.5  ?CL 102  --  105 106 107  ?CO2 20*  --  19* 21* 20*  ?GLUCOSE 123*  --  85 73 78  ?BUN 22*  --  16 14 11   ?CREATININE 1.40* 1.05* 0.97 0.81 0.77  ?CALCIUM 9.3  --  8.3* 8.6* 8.7*  ?MG  --   --   --  2.0 1.9  ? ? ?GFR: ?Estimated Creatinine Clearance: 114.6 mL/min (by C-G formula based on SCr of 0.77 mg/dL). ?Liver Function Tests: ?Recent Labs  ?Lab 02/13/22 ?2059 02/15/22 ?0612 02/16/22 ?0345 02/17/22 ?0418  ?AST 42* 74* 28 13*  ?ALT 76* 92* 76* 46*  ?ALKPHOS 236* 797* 924* 690*  ?BILITOT 0.3 0.6 0.4 0.2*  ?PROT 7.4 5.3* 6.3* 6.1*  ?ALBUMIN 3.6 2.2* 2.4* 2.4*  ? ? ?No results for input(s): LIPASE, AMYLASE in the last 168 hours. ?No results for input(s): AMMONIA in the last 168 hours. ?Coagulation Profile: ?Recent Labs  ?Lab 02/13/22 ?2059  ?INR 1.1  ? ? ?Cardiac Enzymes: ?Recent Labs  ?Lab 02/17/22 ?0418  ?CKTOTAL 31*  ? ?BNP (last 3 results) ?No results for input(s): PROBNP in the last 8760 hours. ?HbA1C: ?No results for input(s): HGBA1C in the last 72 hours. ?CBG: ?No results for input(s): GLUCAP in the last 168 hours. ?Lipid Profile: ?No results for input(s): CHOL, HDL, LDLCALC, TRIG, CHOLHDL, LDLDIRECT in the  last 72 hours. ?Thyroid Function Tests: ?No results for input(s): TSH, T4TOTAL, FREET4, T3FREE, THYROIDAB in the last 72 hours. ?Anemia Panel: ?No results for input(s): VITAMINB12, FOLATE, FERRITIN, TIBC, IRON, RETICCTPCT in the last 72 hours. ?Sepsis Labs: ?Recent Labs  ?Lab 02/13/22 ?2059 02/14/22 ?0650  ?LATICACIDVEN 1.6 1.2  ? ? ? ?Recent Results (from the past 240 hour(s))  ?Culture, blood (Routine x 2)     Status: None (Preliminary result)  ? Collection Time: 02/13/22  9:10 PM  ? Specimen: BLOOD  ?Result Value Ref Range Status  ? Specimen Description   Final  ?  BLOOD RIGHT ANTECUBITAL ?Performed at [facility], [address], [place], [place] [address] ?  ? Special Requests   Final  ?  BOTTLES DRAWN AEROBIC AND ANAEROBIC Blood Culture adequate volume ?Performed at [facility], [address] [place], [place] [address] ?  ? Culture   Final  ?  NO GROWTH 2 DAYS ?Performed at [facility], [place] [address] [place], [place] [address] ?  ? Report Status PENDING  Incomplete  ?Culture, blood (Routine x 2)     Status: None (Preliminary result)  ? Collection Time: 02/14/22  1:45 AM  ? Specimen: BLOOD  ?Result Value Ref Range Status  ? Specimen Description   Final  ?  BLOOD RIGHT ARM ?Performed at [facility], [address], [place], [place] [address] ?  ? Special Requests   Final  ?  BOTTLES DRAWN AEROBIC AND ANAEROBIC Blood Culture adequate volume ?Performed at [facility], [address], [place], [place] [address] ?  ? Culture   Final  ?  NO GROWTH 2 DAYS ?Performed at [facility], [place] [address] [place], [place] [address] ?  ? Report Status PENDING  Incomplete  ? ?  ? ? ? ? ? ?Radiology Studies: ?No results found. ? ? ? ? ? ?Scheduled Meds: ? buPROPion  300 mg Oral Daily  ? busPIRone  30 mg Oral TID  ? enoxaparin (LOVENOX) injection  40 mg Subcutaneous Q24H  ? feeding supplement  1 Container Oral Q24H  ?  mometasone-formoterol  2 puff Inhalation BID  ? montelukast  10 mg Oral QHS  ? Ensure Max Protein  11 oz Oral Daily  ? senna-docusate  1 tablet Oral BID  ? sertraline  50 mg Oral Daily  ? traZODone  100 mg Oral QHS  ? ?

## 2022-02-18 ENCOUNTER — Inpatient Hospital Stay (HOSPITAL_COMMUNITY): Payer: Medicaid Other

## 2022-02-18 DIAGNOSIS — N179 Acute kidney failure, unspecified: Secondary | ICD-10-CM | POA: Diagnosis not present

## 2022-02-18 DIAGNOSIS — L03114 Cellulitis of left upper limb: Secondary | ICD-10-CM | POA: Diagnosis not present

## 2022-02-18 DIAGNOSIS — R7989 Other specified abnormal findings of blood chemistry: Secondary | ICD-10-CM | POA: Diagnosis not present

## 2022-02-18 DIAGNOSIS — J45909 Unspecified asthma, uncomplicated: Secondary | ICD-10-CM | POA: Diagnosis not present

## 2022-02-18 LAB — CBC WITH DIFFERENTIAL/PLATELET
Abs Immature Granulocytes: 0.12 10*3/uL — ABNORMAL HIGH (ref 0.00–0.07)
Basophils Absolute: 0.1 10*3/uL (ref 0.0–0.1)
Basophils Relative: 1 %
Eosinophils Absolute: 0.1 10*3/uL (ref 0.0–0.5)
Eosinophils Relative: 2 %
HCT: 48.5 % — ABNORMAL HIGH (ref 36.0–46.0)
Hemoglobin: 14.9 g/dL (ref 12.0–15.0)
Immature Granulocytes: 2 %
Lymphocytes Relative: 23 %
Lymphs Abs: 1.6 10*3/uL (ref 0.7–4.0)
MCH: 29.2 pg (ref 26.0–34.0)
MCHC: 30.7 g/dL (ref 30.0–36.0)
MCV: 95.1 fL (ref 80.0–100.0)
Monocytes Absolute: 0.5 10*3/uL (ref 0.1–1.0)
Monocytes Relative: 8 %
Neutro Abs: 4.3 10*3/uL (ref 1.7–7.7)
Neutrophils Relative %: 64 %
Platelets: 314 10*3/uL (ref 150–400)
RBC: 5.1 MIL/uL (ref 3.87–5.11)
RDW: 13.2 % (ref 11.5–15.5)
WBC: 6.7 10*3/uL (ref 4.0–10.5)
nRBC: 0 % (ref 0.0–0.2)

## 2022-02-18 LAB — CK: Total CK: 24 U/L — ABNORMAL LOW (ref 38–234)

## 2022-02-18 LAB — COMPREHENSIVE METABOLIC PANEL
ALT: 36 U/L (ref 0–44)
AST: 14 U/L — ABNORMAL LOW (ref 15–41)
Albumin: 2.8 g/dL — ABNORMAL LOW (ref 3.5–5.0)
Alkaline Phosphatase: 665 U/L — ABNORMAL HIGH (ref 38–126)
Anion gap: 8 (ref 5–15)
BUN: 17 mg/dL (ref 6–20)
CO2: 23 mmol/L (ref 22–32)
Calcium: 8.9 mg/dL (ref 8.9–10.3)
Chloride: 105 mmol/L (ref 98–111)
Creatinine, Ser: 0.8 mg/dL (ref 0.44–1.00)
GFR, Estimated: >60 mL/min (ref >60)
Glucose, Bld: 122 mg/dL — ABNORMAL HIGH (ref 70–99)
Potassium: 4.3 mmol/L (ref 3.5–5.1)
Sodium: 136 mmol/L (ref 135–145)
Total Bilirubin: 0.2 mg/dL — ABNORMAL LOW (ref 0.3–1.2)
Total Protein: 7.1 g/dL (ref 6.5–8.1)

## 2022-02-18 LAB — MAGNESIUM: Magnesium: 2.1 mg/dL (ref 1.7–2.4)

## 2022-02-18 LAB — C-REACTIVE PROTEIN: CRP: 5.5 mg/dL — ABNORMAL HIGH (ref <1.0)

## 2022-02-18 NOTE — Progress Notes (Signed)
?PROGRESS NOTE ? ? ? ?Claire [name]  EHO:[id number] DOB: [birth year] DOA: 02/13/2022 ?PCP: Leo Grosser, MD  ? ?Brief Narrative:  ?36 y.o. female with medical history significant of IVDA, GAD, MDD presented with left arm swelling, redness and pain.  On presentation, WBCs were 18.5.  CT left upper extremity showed findings compatible with cellulitis with no fluid collection.  She was started on IV antibiotics. ? ?Assessment & Plan: ?  ?Left upper extremity cellulitis ?SIRS ?-Most likely secondary to IV drug use ?-CT on presentation did not show any fluid collection. swelling slow to improve.  We will get soft tissue ultrasound of left upper extremity. ?-Continue broad-spectrum antibiotics: Currently on Rocephin and Comycin.  Cultures negative so far.  CRP improving.  CK total not elevated ?-No temperature spikes currently. ? ?Acute kidney injury ?Acute metabolic acidosis ?-Improving.  Treated with IV fluids ? ?Hyponatremia ?-Resolved. ? ?Leukocytosis ?-Resolved ? ?Elevated LFTs ?Hep C antibody positive ?-Trend LFTs, improving.  Hep C antibody positive: Patient possibly has chronic hep C because of illicit drug use.  Will need outpatient ID evaluation and follow-up. ? ?IVDA ?-Patient was counseled regarding abstinence from admitting hospitalist.  [facility] consult ? ?GAD ?MDD ?-Continue home regimen.  Outpatient follow-up with psychiatry ? ?Asthma ?-Controlled.  Continue home regimen ? ?DVT prophylaxis: Lovenox ?Code Status: Full ?Family Communication: spoke to mother on phone on 02/17/22 ?Disposition Plan: ?Status is: Inpatient ?Remains inpatient appropriate because: Of severity of illness.  Need for IV antibiotics. ? ?Consultants: None ? ?Procedures: None ? ?Antimicrobials:  ?Anti-infectives (From admission, onward)  ? ? Start     Dose/Rate Route Frequency Ordered Stop  ? 02/17/22 1230  cefTRIAXone (ROCEPHIN) 2 g in sodium chloride 0.9 % 100 mL IVPB       ? 2 g ?200 mL/hr over 30 Minutes Intravenous Every 24 hours  02/17/22 1119    ? 02/16/22 1000  vancomycin (VANCOREADY) IVPB 1250 mg/250 mL       ? 1,250 mg ?166.7 mL/hr over 90 Minutes Intravenous Every 12 hours 02/16/22 0959    ? 02/14/22 1700  ceFEPIme (MAXIPIME) 2 g in sodium chloride 0.9 % 100 mL IVPB  Status:  Discontinued       ? 2 g ?200 mL/hr over 30 Minutes Intravenous Every 8 hours 02/14/22 0845 02/17/22 1119  ? 02/14/22 1400  vancomycin (VANCOREADY) IVPB 1500 mg/300 mL  Status:  Discontinued       ? 1,500 mg ?150 mL/hr over 120 Minutes Intravenous Every 24 hours 02/14/22 0850 02/16/22 0959  ? 02/14/22 0900  ceFEPIme (MAXIPIME) 2 g in sodium chloride 0.9 % 100 mL IVPB       ? 2 g ?200 mL/hr over 30 Minutes Intravenous  Once 02/14/22 0728 02/14/22 0943  ? 02/14/22 0000  vancomycin (VANCOCIN) IVPB 1000 mg/200 mL premix       ? 1,000 mg ?200 mL/hr over 60 Minutes Intravenous Every hour 02/13/22 2349 02/14/22 0159  ? 02/13/22 2330  piperacillin-tazobactam (ZOSYN) IVPB 3.375 g       ? 3.375 g ?100 mL/hr over 30 Minutes Intravenous  Once 02/13/22 2319 02/14/22 0329  ? ?  ? ? ? ?Subjective: ?Patient seen and examined at bedside.  Poor historian.  Denies worsening left forearm or arm pain but still has significant pain.  No fever, chest pain, seizures or vomiting reported.   ?Objective: ?Vitals:  ? 02/16/22 2023 02/17/22 1240 02/17/22 1941 02/18/22 0547  ?BP: (!) 140/93 (!) 144/92 (!) 129/92 137/81  ?Pulse: 86  80 84 78  ?Resp: 20 14 20 18   ?Temp: 98.3 ?F (36.8 ?C) 98.6 ?F (37 ?C) 98.3 ?F (36.8 ?C) (!) 97.5 ?F (36.4 ?C)  ?TempSrc: Oral Oral Oral Oral  ?SpO2: 100% 97% 99% 100%  ?Weight:      ?Height:      ? ? ?Intake/Output Summary (Last 24 hours) at 02/18/2022 0723 ?Last data filed at 02/18/2022 0600 ?Gross per 24 hour  ?Intake 1130.09 ml  ?Output --  ?Net 1130.09 ml  ? ? ?Filed Weights  ? 02/13/22 2036  ?Weight: 83.9 kg  ? ? ?Examination: ? ?General exam: No distress.  On room air currently. ?Respiratory system: Decreased breath sounds at bases bilaterally with scattered  crackles cardiovascular system: Rate controlled; S1-S2 heard  ?gastrointestinal system: Abdomen is distended slightly; soft and nontender.  Bowel sounds are heard  ?extremities: Left upper extremity edema is still present; no lower extremity clubbing ?Central nervous system: More awake today, still slow to respond but answers some questions.  Poor historian. No focal neurological deficits.  Moves extremities ?skin: Left arm and forearm swelling, tenderness and erythema improving but still significant.  No discharge.  Distal neurovascular status is intact. ?Psychiatry: Flat affect.  Currently not agitated.  Does not participate in conversation much. ? ?Data Reviewed: I have personally reviewed following labs and imaging studies ? ?CBC: ?Recent Labs  ?Lab 02/13/22 ?2059 02/14/22 ?02/16/22 02/15/22 ?0612 02/16/22 ?0345 02/17/22 ?0418 02/18/22 ?02/20/22  ?WBC 18.5* 12.0* 8.3 8.1 7.1 6.7  ?NEUTROABS 16.9*  --   --  6.0 4.6 4.3  ?HGB 14.6 12.0 11.4* 13.7 13.1 14.9  ?HCT 45.2 36.4 36.0 43.0 41.8 48.5*  ?MCV 93.2 93.1 94.2 94.3 95.7 95.1  ?PLT 237 198 210 260 302 314  ? ? ?Basic Metabolic Panel: ?Recent Labs  ?Lab 02/13/22 ?2059 02/14/22 ?02/16/22 02/15/22 ?0612 02/16/22 ?0345 02/17/22 ?0418 02/18/22 ?02/20/22  ?NA 133*  --  131* 135 135 136  ?K 3.8  --  4.5 4.7 4.5 4.3  ?CL 102  --  105 106 107 105  ?CO2 20*  --  19* 21* 20* 23  ?GLUCOSE 123*  --  85 73 78 122*  ?BUN 22*  --  16 14 11 17   ?CREATININE 1.40* 1.05* 0.97 0.81 0.77 0.80  ?CALCIUM 9.3  --  8.3* 8.6* 8.7* 8.9  ?MG  --   --   --  2.0 1.9 2.1  ? ? ?GFR: ?Estimated Creatinine Clearance: 114.6 mL/min (by C-G formula based on SCr of 0.8 mg/dL). ?Liver Function Tests: ?Recent Labs  ?Lab 02/13/22 ?2059 02/15/22 ?0612 02/16/22 ?0345 02/17/22 ?0418 02/18/22 ?02/19/22  ?AST 42* 74* 28 13* 14*  ?ALT 76* 92* 76* 46* 36  ?ALKPHOS 236* 797* 924* 690* 665*  ?BILITOT 0.3 0.6 0.4 0.2* 0.2*  ?PROT 7.4 5.3* 6.3* 6.1* 7.1  ?ALBUMIN 3.6 2.2* 2.4* 2.4* 2.8*  ? ? ?No results for input(s): LIPASE, AMYLASE  in the last 168 hours. ?No results for input(s): AMMONIA in the last 168 hours. ?Coagulation Profile: ?Recent Labs  ?Lab 02/13/22 ?2059  ?INR 1.1  ? ? ?Cardiac Enzymes: ?Recent Labs  ?Lab 02/17/22 ?0418 02/18/22 ?02/19/22  ?CKTOTAL 31* 24*  ? ? ?BNP (last 3 results) ?No results for input(s): PROBNP in the last 8760 hours. ?HbA1C: ?No results for input(s): HGBA1C in the last 72 hours. ?CBG: ?No results for input(s): GLUCAP in the last 168 hours. ?Lipid Profile: ?No results for input(s): CHOL, HDL, LDLCALC, TRIG, CHOLHDL, LDLDIRECT in the last 72 hours. ?Thyroid  Function Tests: ?No results for input(s): TSH, T4TOTAL, FREET4, T3FREE, THYROIDAB in the last 72 hours. ?Anemia Panel: ?No results for input(s): VITAMINB12, FOLATE, FERRITIN, TIBC, IRON, RETICCTPCT in the last 72 hours. ?Sepsis Labs: ?Recent Labs  ?Lab 02/13/22 ?2059 02/14/22 ?0650  ?LATICACIDVEN 1.6 1.2  ? ? ? ?Recent Results (from the past 240 hour(s))  ?Culture, blood (Routine x 2)     Status: None (Preliminary result)  ? Collection Time: 02/13/22  9:10 PM  ? Specimen: BLOOD  ?Result Value Ref Range Status  ? Specimen Description   Final  ?  BLOOD RIGHT ANTECUBITAL ?Performed at [facility], [address], [place], [place] [address] ?  ? Special Requests   Final  ?  BOTTLES DRAWN AEROBIC AND ANAEROBIC Blood Culture adequate volume ?Performed at [facility], [address], [place], [place] [address] ?  ? Culture   Final  ?  NO GROWTH 4 DAYS ?Performed at [facility], [address] [place], [place] [address] ?  ? Report Status PENDING  Incomplete  ?Culture, blood (Routine x 2)     Status: None (Preliminary result)  ? Collection Time: 02/14/22  1:45 AM  ? Specimen: BLOOD  ?Result Value Ref Range Status  ? Specimen Description   Final  ?  BLOOD RIGHT ARM ?Performed at [facility], [address], [place], [place] [address] ?  ? Special Requests   Final  ?  BOTTLES DRAWN AEROBIC AND ANAEROBIC  Blood Culture adequate volume ?Performed at [facility], [address], [place], [place] [address] ?  ? Culture   Final  ?  NO GROWTH 4 DAYS ?Performed at [facility], [address]

## 2022-02-18 NOTE — Progress Notes (Signed)
End of shift ? ?Pt lethargic today.  Refused to get OOB to the chair or to walk.  Did walk to the bathroom with staff.  Calls appropriately.  ? ?Pt still receiving ABX. ? ?Ultrasound of L arm was negative for DVT.   ? ?Pt's Mom called and said that sleeping multiple days is her normal when she is in the hospital.   ?

## 2022-02-19 DIAGNOSIS — N179 Acute kidney failure, unspecified: Secondary | ICD-10-CM | POA: Diagnosis not present

## 2022-02-19 DIAGNOSIS — R7989 Other specified abnormal findings of blood chemistry: Secondary | ICD-10-CM | POA: Diagnosis not present

## 2022-02-19 DIAGNOSIS — R768 Other specified abnormal immunological findings in serum: Secondary | ICD-10-CM | POA: Diagnosis not present

## 2022-02-19 DIAGNOSIS — L03114 Cellulitis of left upper limb: Secondary | ICD-10-CM | POA: Diagnosis not present

## 2022-02-19 LAB — CBC WITH DIFFERENTIAL/PLATELET
Abs Immature Granulocytes: 0.28 10*3/uL — ABNORMAL HIGH (ref 0.00–0.07)
Basophils Absolute: 0.1 10*3/uL (ref 0.0–0.1)
Basophils Relative: 1 %
Eosinophils Absolute: 0.2 10*3/uL (ref 0.0–0.5)
Eosinophils Relative: 3 %
HCT: 47.3 % — ABNORMAL HIGH (ref 36.0–46.0)
Hemoglobin: 15.2 g/dL — ABNORMAL HIGH (ref 12.0–15.0)
Immature Granulocytes: 4 %
Lymphocytes Relative: 29 %
Lymphs Abs: 2.1 10*3/uL (ref 0.7–4.0)
MCH: 30.8 pg (ref 26.0–34.0)
MCHC: 32.1 g/dL (ref 30.0–36.0)
MCV: 95.9 fL (ref 80.0–100.0)
Monocytes Absolute: 0.6 10*3/uL (ref 0.1–1.0)
Monocytes Relative: 8 %
Neutro Abs: 4 10*3/uL (ref 1.7–7.7)
Neutrophils Relative %: 55 %
Platelets: 339 10*3/uL (ref 150–400)
RBC: 4.93 MIL/uL (ref 3.87–5.11)
RDW: 13.3 % (ref 11.5–15.5)
WBC: 7.1 10*3/uL (ref 4.0–10.5)
nRBC: 0 % (ref 0.0–0.2)

## 2022-02-19 LAB — COMPREHENSIVE METABOLIC PANEL
ALT: 29 U/L (ref 0–44)
AST: 16 U/L (ref 15–41)
Albumin: 2.7 g/dL — ABNORMAL LOW (ref 3.5–5.0)
Alkaline Phosphatase: 513 U/L — ABNORMAL HIGH (ref 38–126)
Anion gap: 6 (ref 5–15)
BUN: 19 mg/dL (ref 6–20)
CO2: 23 mmol/L (ref 22–32)
Calcium: 8.8 mg/dL — ABNORMAL LOW (ref 8.9–10.3)
Chloride: 104 mmol/L (ref 98–111)
Creatinine, Ser: 0.77 mg/dL (ref 0.44–1.00)
GFR, Estimated: >60 mL/min (ref >60)
Glucose, Bld: 108 mg/dL — ABNORMAL HIGH (ref 70–99)
Potassium: 4.6 mmol/L (ref 3.5–5.1)
Sodium: 133 mmol/L — ABNORMAL LOW (ref 135–145)
Total Bilirubin: 0.3 mg/dL (ref 0.3–1.2)
Total Protein: 6.7 g/dL (ref 6.5–8.1)

## 2022-02-19 LAB — CK: Total CK: 21 U/L — ABNORMAL LOW (ref 38–234)

## 2022-02-19 LAB — CULTURE, BLOOD (ROUTINE X 2)
Culture: NO GROWTH
Culture: NO GROWTH
Special Requests: ADEQUATE
Special Requests: ADEQUATE

## 2022-02-19 LAB — C-REACTIVE PROTEIN: CRP: 1.7 mg/dL — ABNORMAL HIGH (ref <1.0)

## 2022-02-19 LAB — MAGNESIUM: Magnesium: 2.1 mg/dL (ref 1.7–2.4)

## 2022-02-19 NOTE — Progress Notes (Signed)
Pharmacy Antibiotic Note ? ?Claire [name] is a 36 y.o. female admitted on 02/13/2022 with L arm cellulitis, hx substance abuse (Meth inj).  Pharmacy has been consulted for Vancomycin dosing. ? ?CT scan without abscess or deep tissue infection. Soft tissue ultrasound with edema likely reflecting cellulitis - no fluid collection or abscess. ?WBC wnl (stable), CRP 23 >> 1.7, afebrile ? ?Plan: ?Continue vancomycin 1250 mg IV q12 hours (eAUC 456, Scr 0.81)  ?Continue ceftriaxone 2g IV q24 hours per MD ?F/u ability to transition to PO antibiotics ? ?Height: 5\' 10"  (177.8 cm) ?Weight: 83.9 kg (185 lb) ?IBW/kg (Calculated) : 68.5 ? ?Temp (24hrs), Avg:98.3 ?F (36.8 ?C), Min:98 ?F (36.7 ?C), Max:98.5 ?F (36.9 ?C) ? ?Recent Labs  ?Lab 02/13/22 ?2059 02/14/22 ?0650 02/14/22 ?02/16/22 02/15/22 ?0612 02/16/22 ?0345 02/17/22 ?0418 02/18/22 ?02/20/22 02/19/22 ?0354  ?WBC 18.5*  --    < > 8.3 8.1 7.1 6.7 7.1  ?CREATININE 1.40*  --    < > 0.97 0.81 0.77 0.80 0.77  ?LATICACIDVEN 1.6 1.2  --   --   --   --   --   --   ? < > = values in this interval not displayed.  ? ?  ?Estimated Creatinine Clearance: 114.6 mL/min (by C-G formula based on SCr of 0.77 mg/dL).   ? ?Allergies  ?Allergen Reactions  ? Gadolinium Derivatives Anaphylaxis and Rash  ? Morphine And Related Itching  ?  "If given with benadryl its fine"  ? ? ?Antimicrobials this admission: ?3/19 Vanc/Zosyn x1 in ED ?3/19 Vancomycin >>  ?3/19 Cefepime >> 3/22 ?3/22 Ceftriaxone >>  ? ?Dose adjustments this admission: ? ?Microbiology results: ?3/19 BCx: ngtd ? ?Thank you for allowing pharmacy to be a part of this patient?s care. ? ?4/19, PharmD ?02/19/2022 8:50 AM ? ?

## 2022-02-19 NOTE — Progress Notes (Addendum)
?PROGRESS NOTE ? ? ? ?Claire [name]  [id number] DOB: [birth year] DOA: 02/13/2022 ?PCP: Martie Lee, MD  ? ?Brief Narrative:  ?36 y.o. female with medical history significant of IVDA, GAD, MDD presented with left arm swelling, redness and pain.  On presentation, WBCs were 18.5.  CT left upper extremity showed findings compatible with cellulitis with no fluid collection.  She was started on IV antibiotics. ? ?Assessment & Plan: ?  ?Left upper extremity cellulitis ?SIRS ?-Most likely secondary to IV drug use ?-CT on presentation did not show any fluid collection. swelling slow to improve.  Soft tissue ultrasound of the left upper extremity on 02/18/2022 showed cellulitis but no evidence of abscess ?-Continue broad-spectrum antibiotics: Currently on Rocephin and vancomycin.  Cultures negative so far.  CRP improving and down to 1.7 today.  CK total not elevated. ?-No temperature spikes currently. ? ?Acute kidney injury ?Acute metabolic acidosis ?-Improved.  Treated with IV fluids ? ?Hyponatremia ?-Mild.  Encourage oral intake. ? ?Leukocytosis ?-Resolved ? ?Elevated LFTs ?Hep C antibody positive ?-Trend LFTs, improving.  Hep C antibody positive: Patient possibly has chronic hep C because of illicit drug use.  Will need outpatient ID evaluation and follow-up. ? ?IVDA ?-Patient was counseled regarding abstinence from admitting hospitalist.  [facility] consult ? ?GAD ?MDD ?-Continue home regimen.  Outpatient follow-up with psychiatry ? ?Asthma ?-Controlled.  Continue home regimen ? ?DVT prophylaxis: Lovenox ?Code Status: Full ?Family Communication: spoke to mother on phone on 02/19/22 ?Disposition Plan: ?Status is: Inpatient ?Remains inpatient appropriate because: Of severity of illness.  Need for IV antibiotics.  Possible discharge in 1 to 2 days if remains improved clinically. ? ?Consultants: None ? ?Procedures: None ? ?Antimicrobials:  ?Anti-infectives (From admission, onward)  ? ? Start     Dose/Rate Route Frequency Ordered  Stop  ? 02/17/22 1230  cefTRIAXone (ROCEPHIN) 2 g in sodium chloride 0.9 % 100 mL IVPB       ? 2 g ?200 mL/hr over 30 Minutes Intravenous Every 24 hours 02/17/22 1119    ? 02/16/22 1000  vancomycin (VANCOREADY) IVPB 1250 mg/250 mL       ? 1,250 mg ?166.7 mL/hr over 90 Minutes Intravenous Every 12 hours 02/16/22 0959    ? 02/14/22 1700  ceFEPIme (MAXIPIME) 2 g in sodium chloride 0.9 % 100 mL IVPB  Status:  Discontinued       ? 2 g ?200 mL/hr over 30 Minutes Intravenous Every 8 hours 02/14/22 0845 02/17/22 1119  ? 02/14/22 1400  vancomycin (VANCOREADY) IVPB 1500 mg/300 mL  Status:  Discontinued       ? 1,500 mg ?150 mL/hr over 120 Minutes Intravenous Every 24 hours 02/14/22 0850 02/16/22 0959  ? 02/14/22 0900  ceFEPIme (MAXIPIME) 2 g in sodium chloride 0.9 % 100 mL IVPB       ? 2 g ?200 mL/hr over 30 Minutes Intravenous  Once 02/14/22 0728 02/14/22 0943  ? 02/14/22 0000  vancomycin (VANCOCIN) IVPB 1000 mg/200 mL premix       ? 1,000 mg ?200 mL/hr over 60 Minutes Intravenous Every hour 02/13/22 2349 02/14/22 0159  ? 02/13/22 2330  piperacillin-tazobactam (ZOSYN) IVPB 3.375 g       ? 3.375 g ?100 mL/hr over 30 Minutes Intravenous  Once 02/13/22 2319 02/14/22 0329  ? ?  ? ? ? ?Subjective: ?Patient seen and examined at bedside.  Poor historian.  Denies any worsening shortness of breath, fever, nausea or vomiting.  Still complains of intermittent moderate to severe left  upper extremity pain. ?Objective: ?Vitals:  ? 02/18/22 0547 02/18/22 1307 02/18/22 2047 02/19/22 0610  ?BP: 137/81 (!) 136/95 (!) 130/95 107/81  ?Pulse: 78 80 78 74  ?Resp: 18 12  19   ?Temp: (!) 97.5 ?F (36.4 ?C) 98.5 ?F (36.9 ?C) 98 ?F (36.7 ?C) 98.4 ?F (36.9 ?C)  ?TempSrc: Oral Oral Oral Oral  ?SpO2: 100% 99% 100% 97%  ?Weight:      ?Height:      ? ? ?Intake/Output Summary (Last 24 hours) at 02/19/2022 [id number] ?Last data filed at 02/19/2022 0400 ?Gross per 24 hour  ?Intake 1920 ml  ?Output --  ?Net 1920 ml  ? ? ?Filed Weights  ? 02/13/22 2036  ?Weight: 83.9 kg   ? ? ?Examination: ? ?General exam: Currently on room air.  No acute distress. ?Respiratory system: Bilateral decreased breath sounds at bases, no wheezing cardiovascular system: S1-S2 heard; currently rate controlled ?gastrointestinal system: Abdomen is mildly distended; soft and nontender.  Normal bowel sounds are heard  ?extremities: Left upper extremity edema is still present; no lower extremity clubbing or cyanosis Central nervous system: Awake, slow to respond to questions.  Poor historian. No focal neurological deficits.  Moving extremities  ?skin: Left arm and forearm swelling, tenderness and erythema improving but still significant.  No discharge.  Neurovascular status distally is still intact. ?Psychiatry: No signs of agitation currently.  Affect is flat.  Does not participate in conversation much. ? ?Data Reviewed: I have personally reviewed following labs and imaging studies ? ?CBC: ?Recent Labs  ?Lab 02/13/22 ?2059 02/14/22 ?[id number] 02/15/22 ?0612 02/16/22 ?0345 02/17/22 ?0418 02/18/22 ?[id number] 02/19/22 ?0354  ?WBC 18.5*   < > 8.3 8.1 7.1 6.7 7.1  ?NEUTROABS 16.9*  --   --  6.0 4.6 4.3 4.0  ?HGB 14.6   < > 11.4* 13.7 13.1 14.9 15.2*  ?HCT 45.2   < > 36.0 43.0 41.8 48.5* 47.3*  ?MCV 93.2   < > 94.2 94.3 95.7 95.1 95.9  ?PLT 237   < > 210 260 302 314 339  ? < > = values in this interval not displayed.  ? ? ?Basic Metabolic Panel: ?Recent Labs  ?Lab 02/15/22 ?0612 02/16/22 ?0345 02/17/22 ?0418 02/18/22 ?[id number] 02/19/22 ?0354  ?NA 131* 135 135 136 133*  ?K 4.5 4.7 4.5 4.3 4.6  ?CL 105 106 107 105 104  ?CO2 19* 21* 20* 23 23  ?GLUCOSE 85 73 78 122* 108*  ?BUN 16 14 11 17 19   ?CREATININE 0.97 0.81 0.77 0.80 0.77  ?CALCIUM 8.3* 8.6* 8.7* 8.9 8.8*  ?MG  --  2.0 1.9 2.1 2.1  ? ? ?GFR: ?Estimated Creatinine Clearance: 114.6 mL/min (by C-G formula based on SCr of 0.77 mg/dL). ?Liver Function Tests: ?Recent Labs  ?Lab 02/15/22 ?0612 02/16/22 ?0345 02/17/22 ?0418 02/18/22 ?[id number] 02/19/22 ?0354  ?AST 74* 28 13* 14* 16  ?ALT  92* 76* 46* 36 29  ?ALKPHOS 797* 924* 690* 665* 513*  ?BILITOT 0.6 0.4 0.2* 0.2* 0.3  ?PROT 5.3* 6.3* 6.1* 7.1 6.7  ?ALBUMIN 2.2* 2.4* 2.4* 2.8* 2.7*  ? ? ?No results for input(s): LIPASE, AMYLASE in the last 168 hours. ?No results for input(s): AMMONIA in the last 168 hours. ?Coagulation Profile: ?Recent Labs  ?Lab 02/13/22 ?2059  ?INR 1.1  ? ? ?Cardiac Enzymes: ?Recent Labs  ?Lab 02/17/22 ?0418 02/18/22 ?[id number] 02/19/22 ?0354  ?CKTOTAL 31* 24* 21*  ? ? ?BNP (last 3 results) ?No results for input(s): PROBNP in the last 8760 hours. ?HbA1C: ?No results  for input(s): HGBA1C in the last 72 hours. ?CBG: ?No results for input(s): GLUCAP in the last 168 hours. ?Lipid Profile: ?No results for input(s): CHOL, HDL, LDLCALC, TRIG, CHOLHDL, LDLDIRECT in the last 72 hours. ?Thyroid Function Tests: ?No results for input(s): TSH, T4TOTAL, FREET4, T3FREE, THYROIDAB in the last 72 hours. ?Anemia Panel: ?No results for input(s): VITAMINB12, FOLATE, FERRITIN, TIBC, IRON, RETICCTPCT in the last 72 hours. ?Sepsis Labs: ?Recent Labs  ?Lab 02/13/22 ?2059 02/14/22 ?0650  ?LATICACIDVEN 1.6 1.2  ? ? ? ?Recent Results (from the past 240 hour(s))  ?Culture, blood (Routine x 2)     Status: None  ? Collection Time: 02/13/22  9:10 PM  ? Specimen: BLOOD  ?Result Value Ref Range Status  ? Specimen Description   Final  ?  BLOOD RIGHT ANTECUBITAL ?Performed at [facility], [address] [place], [place] [address] ?  ? Special Requests   Final  ?  BOTTLES DRAWN AEROBIC AND ANAEROBIC Blood Culture adequate volume ?Performed at [facility], [address], [place], [place] [address] ?  ? Culture   Final  ?  NO GROWTH 5 DAYS ?Performed at [facility], [place] [address], [place], [place] [address] ?  ? Report Status 02/19/2022 FINAL  Final  ?Culture, blood (Routine x 2)     Status: None  ? Collection Time: 02/14/22  1:45 AM  ? Specimen: BLOOD  ?Result Value Ref Range Status  ? Specimen Description   Final  ?   BLOOD RIGHT ARM ?Performed at [facility], [address], [place], [place] [address] ?  ? Special Requests   Final  ?  BOTTLES DRAWN AEROBIC AND ANAEROBIC Blood Culture adequat

## 2022-02-19 NOTE — Progress Notes (Addendum)
End of shift ? ?Pt ambulated in the hallway one time today.  Refused other mobility requests and did not get OOB to the chair as requested.  ? ?Pt only received pain meds 1 time this shift and zofran 1 time this shift. ? ?Pt's Left arm swelling has improved. ? ?Anticipate pt d/c 3/25. ?

## 2022-02-20 LAB — COMPREHENSIVE METABOLIC PANEL
ALT: 25 U/L (ref 0–44)
AST: 15 U/L (ref 15–41)
Albumin: 3 g/dL — ABNORMAL LOW (ref 3.5–5.0)
Alkaline Phosphatase: 456 U/L — ABNORMAL HIGH (ref 38–126)
Anion gap: 6 (ref 5–15)
BUN: 22 mg/dL — ABNORMAL HIGH (ref 6–20)
CO2: 23 mmol/L (ref 22–32)
Calcium: 8.9 mg/dL (ref 8.9–10.3)
Chloride: 104 mmol/L (ref 98–111)
Creatinine, Ser: 0.8 mg/dL (ref 0.44–1.00)
GFR, Estimated: >60 mL/min (ref >60)
Glucose, Bld: 133 mg/dL — ABNORMAL HIGH (ref 70–99)
Potassium: 4.3 mmol/L (ref 3.5–5.1)
Sodium: 133 mmol/L — ABNORMAL LOW (ref 135–145)
Total Bilirubin: 0.4 mg/dL (ref 0.3–1.2)
Total Protein: 7.2 g/dL (ref 6.5–8.1)

## 2022-02-20 LAB — CBC WITH DIFFERENTIAL/PLATELET
Abs Immature Granulocytes: 0.31 10*3/uL — ABNORMAL HIGH (ref 0.00–0.07)
Basophils Absolute: 0.1 10*3/uL (ref 0.0–0.1)
Basophils Relative: 1 %
Eosinophils Absolute: 0.2 10*3/uL (ref 0.0–0.5)
Eosinophils Relative: 3 %
HCT: 47.8 % — ABNORMAL HIGH (ref 36.0–46.0)
Hemoglobin: 15 g/dL (ref 12.0–15.0)
Immature Granulocytes: 4 %
Lymphocytes Relative: 31 %
Lymphs Abs: 2.2 10*3/uL (ref 0.7–4.0)
MCH: 30.2 pg (ref 26.0–34.0)
MCHC: 31.4 g/dL (ref 30.0–36.0)
MCV: 96.4 fL (ref 80.0–100.0)
Monocytes Absolute: 0.6 10*3/uL (ref 0.1–1.0)
Monocytes Relative: 8 %
Neutro Abs: 3.7 10*3/uL (ref 1.7–7.7)
Neutrophils Relative %: 53 %
Platelets: 388 10*3/uL (ref 150–400)
RBC: 4.96 MIL/uL (ref 3.87–5.11)
RDW: 13.2 % (ref 11.5–15.5)
WBC: 7 10*3/uL (ref 4.0–10.5)
nRBC: 0 % (ref 0.0–0.2)

## 2022-02-20 LAB — CK: Total CK: 19 U/L — ABNORMAL LOW (ref 38–234)

## 2022-02-20 LAB — MAGNESIUM: Magnesium: 2.1 mg/dL (ref 1.7–2.4)

## 2022-02-20 LAB — C-REACTIVE PROTEIN: CRP: 1 mg/dL — ABNORMAL HIGH (ref <1.0)

## 2022-02-20 MED ORDER — OXYCODONE HCL 5 MG PO TABS: 5.0000 mg | ORAL_TABLET | Freq: Four times a day (QID) | ORAL | 0 refills | Status: DC | PRN

## 2022-02-20 MED ORDER — POLYETHYLENE GLYCOL 3350 17 G PO PACK: 17.0000 g | PACK | Freq: Every day | ORAL | 0 refills | Status: DC | PRN

## 2022-02-20 MED ORDER — DOXYCYCLINE HYCLATE 100 MG PO TABS: 100.0000 mg | ORAL_TABLET | Freq: Two times a day (BID) | ORAL | 0 refills | Status: AC

## 2022-02-20 MED ORDER — SENNOSIDES-DOCUSATE SODIUM 8.6-50 MG PO TABS
1.0000 | ORAL_TABLET | Freq: Two times a day (BID) | ORAL | 0 refills | Status: DC
Start: 2022-02-20 — End: 2022-02-20

## 2022-02-20 MED ORDER — DOXYCYCLINE HYCLATE 100 MG PO TABS: 100.0000 mg | ORAL_TABLET | Freq: Two times a day (BID) | ORAL | 0 refills | Status: DC

## 2022-02-20 MED ORDER — TIZANIDINE HCL 4 MG PO CAPS
4.0000 mg | ORAL_CAPSULE | Freq: Three times a day (TID) | ORAL | Status: AC | PRN
Start: 2022-02-20 — End: ?

## 2022-02-20 MED ORDER — AMOXICILLIN-POT CLAVULANATE 875-125 MG PO TABS: 1.0000 | ORAL_TABLET | Freq: Two times a day (BID) | ORAL | 0 refills | Status: DC

## 2022-02-20 MED ORDER — SENNOSIDES-DOCUSATE SODIUM 8.6-50 MG PO TABS
1.0000 | ORAL_TABLET | Freq: Two times a day (BID) | ORAL | 0 refills | Status: DC
Start: 2022-02-20 — End: 2022-08-18

## 2022-02-20 NOTE — Plan of Care (Signed)
?  Problem: Education: ?Goal: Knowledge of General Education information will improve ?Description: Including pain rating scale, medication(s)/side effects and non-pharmacologic comfort measures ?Outcome: Adequate for Discharge ?  ?Problem: Health Behavior/Discharge Planning: ?Goal: Ability to manage health-related needs will improve ?Outcome: Adequate for Discharge ?  ?Problem: Clinical Measurements: ?Goal: Ability to maintain clinical measurements within normal limits will improve ?Outcome: Adequate for Discharge ?Goal: Will remain free from infection ?Outcome: Adequate for Discharge ?Goal: Diagnostic test results will improve ?Outcome: Adequate for Discharge ?Goal: Respiratory complications will improve ?Outcome: Adequate for Discharge ?Goal: Cardiovascular complication will be avoided ?Outcome: Adequate for Discharge ?  ?Problem: Activity: ?Goal: Risk for activity intolerance will decrease ?Outcome: Adequate for Discharge ?  ?Problem: Nutrition: ?Goal: Adequate nutrition will be maintained ?Outcome: Adequate for Discharge ?  ?Problem: Coping: ?Goal: Level of anxiety will decrease ?Outcome: Adequate for Discharge ?  ?Problem: Elimination: ?Goal: Will not experience complications related to bowel motility ?Outcome: Adequate for Discharge ?Goal: Will not experience complications related to urinary retention ?Outcome: Adequate for Discharge ?  ?Problem: Pain Managment: ?Goal: General experience of comfort will improve ?Outcome: Adequate for Discharge ?  ?Problem: Safety: ?Goal: Ability to remain free from injury will improve ?Outcome: Adequate for Discharge ?  ?Problem: Skin Integrity: ?Goal: Risk for impaired skin integrity will decrease ?Outcome: Adequate for Discharge ?  ?Problem: Pain Managment: ?Goal: General experience of comfort will improve ?Outcome: Adequate for Discharge ?  ?Problem: Skin Integrity: ?Goal: Risk for impaired skin integrity will decrease ?Outcome: Adequate for Discharge ?  ?

## 2022-02-20 NOTE — Discharge Summary (Signed)
Physician Discharge Summary  ?Claire [name] JIR:[id number] DOB: [birth year] DOA: 02/13/2022 ? ?PCP: Leo Grosser, MD ? ?Admit date: 02/13/2022 ?Discharge date: 02/20/2022 ? ?Admitted From: Home ?Disposition: Home ? ?Recommendations for Outpatient Follow-up:  ?Follow up with PCP in 1 week with repeat CBC/CMP ?Outpatient evaluation and follow-up by ID ?Abstain from illicit drug use ?Follow up in ED if symptoms worsen or new appear ? ? ?Home Health: No ?Equipment/Devices: None ? ?Discharge Condition: Stable ?CODE STATUS: Full ?Diet recommendation: Regular ? ?Brief/Interim Summary: ?36 y.o. female with medical history significant of IVDA, GAD, MDD presented with left arm swelling, redness and pain.  On presentation, WBCs were 18.5.  CT left upper extremity showed findings compatible with cellulitis with no fluid collection.  She was started on IV antibiotics.  She has had very slow improvement in her cellulitis.  Soft tissue ultrasound of the left upper extremity on 02/18/2022 showed no evidence of abscess.  She is currently afebrile, hemodynamically stable with improved inflammatory markers.  Currently on Rocephin and vancomycin.  She will be discharged home on oral Augmentin and doxycycline.  She needs to abstain from illicit drug use.  She will benefit from outpatient follow-up by ID.   ? ?Discharge Diagnoses:  ? ?Left upper extremity cellulitis ?SIRS ?-Most likely secondary to IV drug use ?-CT on presentation did not show any fluid collection. swelling slow to improve.  Soft tissue ultrasound of the left upper extremity on 02/18/2022 showed cellulitis but no evidence of abscess ?-Continue broad-spectrum antibiotics: Currently on Rocephin and vancomycin.  Cultures negative so far.  CRP improving and down to 1.0 today.  CK total not elevated. ?-No temperature spikes currently. ?-Currently hemodynamically stable, tolerating diet.  Cellulitis has much improved.  He will be discharged home on oral Augmentin and doxycycline  for another 7 days.  Abstain from illicit drug use ?-Outpatient ID referral has been made as per patient's mother request.  I had spoken to ID/Dr. Drue Second regarding the same as well on 02/19/2022 ?  ?Acute kidney injury ?Acute metabolic acidosis ?-Improved.  Treated with IV fluids ?  ?Hyponatremia ?-Mild.  Encourage oral intake.  Outpatient follow-up. ?  ?Leukocytosis ?-Resolved ?  ?Elevated LFTs ?Hep C antibody positive ?-LFTs trending down.  Hep C antibody positive: Patient possibly has chronic hep C because of illicit drug use.  Will need outpatient ID evaluation and follow-up. ?  ?IVDA ?-Patient was counseled regarding abstinence from admitting hospitalist.  [facility] consult ?  ?GAD ?MDD ?-Continue home regimen.  Outpatient follow-up with psychiatry ?  ?Asthma ?-Controlled.  Continue home regimen ? ?Discharge Instructions ? ?Discharge Instructions   ? ? Ambulatory referral to Infectious Disease   Complete by: As directed ?  ? Recurrent abscesses  ? Diet general   Complete by: As directed ?  ? Increase activity slowly   Complete by: As directed ?  ? ?  ? ?Allergies as of 02/20/2022   ? ?   Reactions  ? Gadolinium Derivatives Anaphylaxis, Rash  ? Morphine And Related Itching  ? "If given with benadryl its fine"  ? ?  ? ?  ?Medication List  ?  ? ?STOP taking these medications   ? ?clindamycin 300 MG capsule ?Commonly known as: CLEOCIN ?  ?DULoxetine 30 MG capsule ?Commonly known as: CYMBALTA ?  ?ondansetron 8 MG tablet ?Commonly known as: ZOFRAN ?  ?sulfamethoxazole-trimethoprim 800-160 MG tablet ?Commonly known as: BACTRIM DS ?  ?traMADol 50 MG tablet ?Commonly known as: ULTRAM ?  ? ?  ? ?  TAKE these medications   ? ?albuterol 108 (90 Base) MCG/ACT inhaler ?Commonly known as: VENTOLIN HFA ?Inhale 2 puffs into the lungs every 6 (six) hours as needed. Wheezing ?  ?amoxicillin-clavulanate 875-125 MG tablet ?Commonly known as: Augmentin ?Take 1 tablet by mouth 2 (two) times daily for 7 days. ?  ?aspirin-acetaminophen-caffeine  250-250-65 MG tablet ?Commonly known as: EXCEDRIN MIGRAINE ?Take 2 tablets by mouth every 6 (six) hours as needed (pain). ?  ?budesonide-formoterol 160-4.5 MCG/ACT inhaler ?Commonly known as: SYMBICORT ?Inhale 2 puffs into the lungs 2 (two) times daily. ?  ?buPROPion 300 MG 24 hr tablet ?Commonly known as: WELLBUTRIN XL ?Take 300 mg by mouth daily. ?  ?busPIRone 30 MG tablet ?Commonly known as: BUSPAR ?Take 30 mg by mouth in the morning, at noon, and at bedtime. ?  ?cyanocobalamin 1000 MCG/ML injection ?Commonly known as: (VITAMIN B-12) ?Inject 100 mcg into the muscle every 30 (thirty) days. ?  ?diphenhydrAMINE 25 MG tablet ?Commonly known as: BENADRYL ?Take 25 mg by mouth every 6 (six) hours as needed. ?  ?doxycycline 100 MG tablet ?Commonly known as: VIBRA-TABS ?Take 1 tablet (100 mg total) by mouth 2 (two) times daily for 7 days. ?  ?dronabinol 5 MG capsule ?Commonly known as: MARINOL ?Take 5 mg by mouth 2 (two) times daily as needed. Nausea ?  ?ibuprofen 200 MG tablet ?Commonly known as: ADVIL ?Take 800 mg by mouth every 6 (six) hours as needed. ?  ?montelukast 10 MG tablet ?Commonly known as: SINGULAIR ?Take 10 mg by mouth at bedtime. ?  ?mupirocin ointment 2 % ?Commonly known as: BACTROBAN ?Apply 1 application. topically 3 (three) times daily as needed (irritation). ?  ?omeprazole 40 MG capsule ?Commonly known as: PRILOSEC ?Take 40 mg by mouth in the morning and at bedtime. ?  ?oxyCODONE 5 MG immediate release tablet ?Commonly known as: Oxy IR/ROXICODONE ?Take 1 tablet (5 mg total) by mouth every 6 (six) hours as needed for moderate pain or severe pain. ?  ?polyethylene glycol 17 g packet ?Commonly known as: MIRALAX / GLYCOLAX ?Take 17 g by mouth daily as needed for moderate constipation. ?  ?pregabalin 225 MG capsule ?Commonly known as: LYRICA ?Take 225 mg by mouth 3 (three) times daily. ?  ?promethazine 25 MG tablet ?Commonly known as: PHENERGAN ?Take 25 mg by mouth every 6 (six) hours as needed for nausea  or vomiting. ?  ?rizatriptan 10 MG tablet ?Commonly known as: MAXALT ?Take 10 mg by mouth as needed for migraine. ?  ?senna-docusate 8.6-50 MG tablet ?Commonly known as: Senokot-S ?Take 1 tablet by mouth 2 (two) times daily. ?  ?sertraline 50 MG tablet ?Commonly known as: ZOLOFT ?Take 50 mg by mouth daily. ?  ?tiZANidine 4 MG capsule ?Commonly known as: ZANAFLEX ?Take 1 capsule (4 mg total) by mouth 3 (three) times daily as needed for muscle spasms. ?What changed:  ?when to take this ?reasons to take this ?  ?traZODone 100 MG tablet ?Commonly known as: DESYREL ?Take 100 mg by mouth at bedtime. ?  ?Vitamin D 50 MCG (2000 UT) tablet ?Take 2,000 Units by mouth daily. ?  ? ?  ? ? Follow-up Information   ? ? Leo Grosser, MD. Schedule an appointment as soon as possible for a visit in 1 week(s).   ?Specialty: Family Medicine ?Contact information: ?[address] ?[facility] ?[place] [address] ?[phone] ? ? ?  ?  ? ?  ?  ? ?  ? ?Allergies  ?  Allergen Reactions  ? Gadolinium Derivatives Anaphylaxis and Rash  ? Morphine And Related Itching  ?  "If given with benadryl its fine"  ? ? ?Consultations: ?None ? ? ?Procedures/Studies: ?US Abdomen Complete ? ?Result Date: 02/14/2022 ?CLINICAL DATA:  Abnormal liver function tests, renal dysfunction EXAM: ABDOMEN ULTRASOUND COMPLETE COMPARISON:  None. FINDINGS: Gallbladder: Gallbladder is not seen consistent with cholecystectomy. Common bile duct: Diameter: 4 mm Liver: No focal lesion identified. Within normal limits in parenchymal echogenicity. Portal vein is patent on color Doppler imaging with normal direction of blood flow towards the liver. IVC: No abnormality visualized. Pancreas: Visualized portion unremarkable. Spleen: Spleen is enlarged in size measuring proximally 13.4 cm in maximum diameter. Right Kidney: Length: 10.9 cm. Echogenicity within normal limits. No mass or hydronephrosis visualized. Left Kidney: Length: 10.6 cm. Echogenicity within  normal limits. No mass or hydronephrosis visualized. Abdominal aorta: No aneurysm visualized. Other findings: None. IMPRESSION: Enlarged spleen. No other significant sonographic abnormality is seen in th

## 2022-02-26 ENCOUNTER — Ambulatory Visit: Payer: Medicaid Other | Admitting: Internal Medicine

## 2022-03-01 ENCOUNTER — Ambulatory Visit: Payer: Medicaid Other | Admitting: Infectious Disease

## 2022-05-25 ENCOUNTER — Other Ambulatory Visit (HOSPITAL_COMMUNITY): Payer: Self-pay

## 2022-05-25 ENCOUNTER — Telehealth: Payer: Self-pay

## 2022-05-27 ENCOUNTER — Other Ambulatory Visit: Payer: Self-pay

## 2022-05-27 ENCOUNTER — Encounter: Payer: Self-pay | Admitting: Infectious Disease

## 2022-05-27 ENCOUNTER — Telehealth: Payer: Self-pay | Admitting: Pharmacist

## 2022-05-27 ENCOUNTER — Other Ambulatory Visit (HOSPITAL_COMMUNITY): Payer: Self-pay

## 2022-05-27 ENCOUNTER — Ambulatory Visit (INDEPENDENT_AMBULATORY_CARE_PROVIDER_SITE_OTHER): Payer: 59 | Admitting: Infectious Disease

## 2022-05-27 VITALS — BP 103/69 | HR 93 | Temp 98.0°F | Ht 70.0 in | Wt 188.0 lb

## 2022-05-27 DIAGNOSIS — M4640 Discitis, unspecified, site unspecified: Secondary | ICD-10-CM | POA: Insufficient documentation

## 2022-05-27 DIAGNOSIS — M4620 Osteomyelitis of vertebra, site unspecified: Secondary | ICD-10-CM | POA: Diagnosis not present

## 2022-05-27 DIAGNOSIS — M4646 Discitis, unspecified, lumbar region: Secondary | ICD-10-CM | POA: Diagnosis not present

## 2022-05-27 DIAGNOSIS — M726 Necrotizing fasciitis: Secondary | ICD-10-CM

## 2022-05-27 DIAGNOSIS — F1510 Other stimulant abuse, uncomplicated: Secondary | ICD-10-CM | POA: Diagnosis not present

## 2022-05-27 DIAGNOSIS — B182 Chronic viral hepatitis C: Secondary | ICD-10-CM | POA: Diagnosis not present

## 2022-05-27 HISTORY — DX: Necrotizing fasciitis: M72.6

## 2022-05-27 HISTORY — DX: Osteomyelitis of vertebra, site unspecified: M46.20

## 2022-05-27 HISTORY — DX: Discitis, unspecified, site unspecified: M46.40

## 2022-05-27 MED ORDER — AMOXICILLIN 500 MG PO CAPS: 500.0000 mg | ORAL_CAPSULE | Freq: Three times a day (TID) | ORAL | 3 refills | Status: AC

## 2022-05-27 NOTE — Telephone Encounter (Signed)
Dr. Daiva Eves requested pharmacy meet with Claire [name] today to review Mavyret. Pending full workup and medicine approval.   Counseled patient to take all three tablets of Mavyret daily with food.  Counseled patient the need to take all three tablets together and to not separate them out during the day. Encouraged patient not to miss any doses and explained how their chance of cure could go down with each dose missed. Counseled patient on what to do if dose is missed - if it is closer to the missed dose take immediately; if closer to next dose then skip dose and take the next dose at the usual time. Counseled patient on common side effects such as headache, fatigue, and nausea and that these normally decrease with time. I reviewed patient medications and found no drug interactions. Discussed with patient that there are several drug interactions with Mavyret and instructed patient to call the clinic if she wishes to start a new medication during course of therapy. Also advised patient to call if she experiences any side effects. Patient will follow-up with me in the pharmacy clinic on 7/28 (probably will need to be pushed back until labs result and medicine is approved).  Margarite Gouge, PharmD, CPP Clinical Pharmacist Practitioner Infectious Diseases Clinical Pharmacist [facility]
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
## 2022-05-28 LAB — HIV ANTIBODY (ROUTINE TESTING W REFLEX): HIV 1&2 Ab, 4th Generation: NONREACTIVE

## 2022-05-28 LAB — COMPLETE METABOLIC PANEL WITH GFR
AG Ratio: 1.2 (calc) (ref 1.0–2.5)
ALT: 157 U/L — ABNORMAL HIGH (ref 6–29)
AST: 151 U/L — ABNORMAL HIGH (ref 10–30)
Albumin: 3.5 g/dL — ABNORMAL LOW (ref 3.6–5.1)
Alkaline phosphatase (APISO): 124 U/L (ref 31–125)
BUN: 13 mg/dL (ref 7–25)
CO2: 17 mmol/L — ABNORMAL LOW (ref 20–32)
Calcium: 9 mg/dL (ref 8.6–10.2)
Chloride: 110 mmol/L (ref 98–110)
Creat: 0.95 mg/dL (ref 0.50–0.97)
Globulin: 3 g/dL (calc) (ref 1.9–3.7)
Glucose, Bld: 67 mg/dL (ref 65–99)
Potassium: 4.8 mmol/L (ref 3.5–5.3)
Sodium: 136 mmol/L (ref 135–146)
Total Bilirubin: 0.3 mg/dL (ref 0.2–1.2)
Total Protein: 6.5 g/dL (ref 6.1–8.1)
eGFR: 80 mL/min/{1.73_m2} (ref >=60)

## 2022-05-28 LAB — CBC WITH DIFFERENTIAL/PLATELET
Absolute Monocytes: 252 cells/uL (ref 200–950)
Basophils Absolute: 32 cells/uL (ref 0–200)
Basophils Relative: 0.8 %
Eosinophils Absolute: 300 cells/uL (ref 15–500)
Eosinophils Relative: 7.5 %
HCT: 39 % (ref 35.0–45.0)
Hemoglobin: 12.3 g/dL (ref 11.7–15.5)
Lymphs Abs: 1252 cells/uL (ref 850–3900)
MCH: 26.9 pg — ABNORMAL LOW (ref 27.0–33.0)
MCHC: 31.5 g/dL — ABNORMAL LOW (ref 32.0–36.0)
MCV: 85.3 fL (ref 80.0–100.0)
MPV: 12.1 fL (ref 7.5–12.5)
Monocytes Relative: 6.3 %
Neutro Abs: 2164 cells/uL (ref 1500–7800)
Neutrophils Relative %: 54.1 %
Platelets: 214 10*3/uL (ref 140–400)
RBC: 4.57 10*6/uL (ref 3.80–5.10)
RDW: 15.3 % — ABNORMAL HIGH (ref 11.0–15.0)
Total Lymphocyte: 31.3 %
WBC: 4 10*3/uL (ref 3.8–10.8)

## 2022-06-01 LAB — HCV FIBROSURE
ALPHA 2-MACROGLOBULINS, QN: 233 mg/dL (ref 110–276)
ALT (SGPT) P5P: 191 IU/L — ABNORMAL HIGH (ref 0–40)
Apolipoprotein A-1: 167 mg/dL (ref 116–209)
Bilirubin, Total: 0.2 mg/dL (ref 0.0–1.2)
Fibrosis Score: 0.12 (ref 0.00–0.21)
GGT: 33 IU/L (ref 0–60)
Haptoglobin: 55 mg/dL (ref 33–278)
Necroinflammat Activity Score: 0.75 — ABNORMAL HIGH (ref 0.00–0.17)

## 2022-06-22 ENCOUNTER — Other Ambulatory Visit: Payer: 59

## 2022-06-25 ENCOUNTER — Ambulatory Visit: Payer: 59 | Admitting: Pharmacist

## 2022-07-05 ENCOUNTER — Inpatient Hospital Stay: Payer: Medicaid Other | Admitting: Infectious Disease

## 2022-07-06 ENCOUNTER — Telehealth: Payer: Self-pay

## 2022-07-06 ENCOUNTER — Ambulatory Visit
Admission: RE | Admit: 2022-07-06 | Discharge: 2022-07-06 | Disposition: A | Discharge status: Home / Self Care | Payer: 59 | Source: Ambulatory Visit | Attending: Infectious Disease | Admitting: Infectious Disease

## 2022-07-06 ENCOUNTER — Other Ambulatory Visit (HOSPITAL_COMMUNITY): Payer: Self-pay

## 2022-07-06 DIAGNOSIS — B182 Chronic viral hepatitis C: Secondary | ICD-10-CM

## 2022-07-06 NOTE — Telephone Encounter (Signed)
[facility] Patient Advocate Encounter  Insurance verification completed.    The patient is insured through [employer].  Medication will need a PA.  We will continue to follow to see if copay assistance is needed.  Donaldo Teegarden, CPhT Specialty Pharmacy Patient Advocate [facility] Phone: [phone] Fax:  [phone]  

## 2022-07-07 ENCOUNTER — Ambulatory Visit: Payer: 59 | Admitting: Pharmacist

## 2022-07-21 ENCOUNTER — Ambulatory Visit (INDEPENDENT_AMBULATORY_CARE_PROVIDER_SITE_OTHER): Payer: 59 | Admitting: Pharmacist

## 2022-07-21 ENCOUNTER — Other Ambulatory Visit: Payer: Self-pay

## 2022-07-21 DIAGNOSIS — B182 Chronic viral hepatitis C: Secondary | ICD-10-CM
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
## 2022-07-26 LAB — HEPATITIS C GENOTYPE

## 2022-07-27 ENCOUNTER — Telehealth: Payer: Self-pay

## 2022-07-27 ENCOUNTER — Other Ambulatory Visit (HOSPITAL_COMMUNITY): Payer: Self-pay

## 2022-07-27 NOTE — Telephone Encounter (Signed)
[facility] Patient Advocate Encounter   Received notification from [employer] that prior authorization for Mavyret is required.   PA submitted on 07/27/22 Key [id number] Status is pending    [facility] will continue to follow.   Clearance Coots, CPhT Specialty Pharmacy Patient [facility] for Infectious Disease Phone: [phone] Fax:  [phone]

## 2022-07-27 NOTE — Progress Notes (Signed)
Patient with genotype 1a HCV, F0, RNA 28,300 at [facility] on 03/04/22 - Dr. Daiva Eves would like [place] to look into Mavyret x 8 weeks for patient. Thanks Lupita Leash!

## 2022-07-29 ENCOUNTER — Other Ambulatory Visit: Payer: Self-pay | Admitting: Pharmacist

## 2022-07-29 ENCOUNTER — Other Ambulatory Visit (HOSPITAL_COMMUNITY): Payer: Self-pay

## 2022-07-29 ENCOUNTER — Telehealth: Payer: Self-pay

## 2022-07-29 DIAGNOSIS — B182 Chronic viral hepatitis C: Secondary | ICD-10-CM

## 2022-07-29 MED ORDER — EPCLUSA 400-100 MG PO TABS: 1.0000 | ORAL_TABLET | Freq: Every day | ORAL | 2 refills | Status: DC

## 2022-07-29 NOTE — Telephone Encounter (Signed)
[facility] Patient Advocate Encounter  Prior Authorization for Epclusa(Brand Name) has been approved.    PA# [id number] SY Effective dates: 07/29/22 through 10/21/22  Mavyret was not on the patient formulary , I was able to get Epclusa Approved.   [facility]  [facility] will continue to follow.  Clearance Coots, CPhT Specialty Pharmacy Patient [facility] for Infectious Disease Phone: [phone] Fax:  [phone] E

## 2022-07-29 NOTE — Telephone Encounter (Signed)
Script sent - thanks Lupita Leash! FYI Dr. Daiva Eves - unable to get Mavyret for her.

## 2022-08-05 ENCOUNTER — Telehealth: Payer: Self-pay

## 2022-08-05 NOTE — Telephone Encounter (Signed)
Received call from [facility] wanting to confirm HBV status of patient before dispensing Epclusa. Relayed that HBV was negative in March of this year.   She wanted to advise of interaction with omeprazole and Epclusa. Notified Margarite Gouge, [facility] who advises that dose can be adjusted and separated from Epclusa administration if patient is still taking.   Called Frayda to see if she is still taking omeprazole, no answer and no voicemail box set up.   Sandie Ano, RN

## 2022-08-16 ENCOUNTER — Telehealth: Payer: Self-pay

## 2022-08-16 NOTE — Telephone Encounter (Signed)
[facility] Patient Advocate Encounter  I have spoken to the patient and she told me she started Mavyret on 09/12/2, She will have to call me back once she speaks with her mother to set up a 1 month follow up.  Ileene Patrick, [place] [facility] Patient [facility] for [facility] Disease Phone: [phone] Fax:  [phone]

## 2022-08-18 ENCOUNTER — Other Ambulatory Visit: Payer: Self-pay

## 2022-08-18 ENCOUNTER — Emergency Department (HOSPITAL_BASED_OUTPATIENT_CLINIC_OR_DEPARTMENT_OTHER): Payer: 59

## 2022-08-18 ENCOUNTER — Inpatient Hospital Stay (HOSPITAL_BASED_OUTPATIENT_CLINIC_OR_DEPARTMENT_OTHER)
Admission: EM | Admit: 2022-08-18 | Discharge: 2022-08-26 | DRG: 486 | Disposition: A | Discharge status: Home Health | Payer: 59 | Attending: Internal Medicine | Admitting: Internal Medicine

## 2022-08-18 ENCOUNTER — Emergency Department (HOSPITAL_BASED_OUTPATIENT_CLINIC_OR_DEPARTMENT_OTHER): Payer: 59 | Admitting: Radiology

## 2022-08-18 ENCOUNTER — Emergency Department (HOSPITAL_COMMUNITY): Payer: 59

## 2022-08-18 ENCOUNTER — Encounter (HOSPITAL_BASED_OUTPATIENT_CLINIC_OR_DEPARTMENT_OTHER): Payer: Self-pay | Admitting: Obstetrics and Gynecology

## 2022-08-18 ENCOUNTER — Encounter (HOSPITAL_COMMUNITY): Payer: Self-pay

## 2022-08-18 DIAGNOSIS — J45909 Unspecified asthma, uncomplicated: Secondary | ICD-10-CM | POA: Diagnosis present

## 2022-08-18 DIAGNOSIS — I10 Essential (primary) hypertension: Secondary | ICD-10-CM | POA: Diagnosis not present

## 2022-08-18 DIAGNOSIS — Z79899 Other long term (current) drug therapy: Secondary | ICD-10-CM | POA: Diagnosis not present

## 2022-08-18 DIAGNOSIS — F332 Major depressive disorder, recurrent severe without psychotic features: Secondary | ICD-10-CM | POA: Diagnosis not present

## 2022-08-18 DIAGNOSIS — F17210 Nicotine dependence, cigarettes, uncomplicated: Secondary | ICD-10-CM | POA: Diagnosis present

## 2022-08-18 DIAGNOSIS — Z888 Allergy status to other drugs, medicaments and biological substances status: Secondary | ICD-10-CM

## 2022-08-18 DIAGNOSIS — B159 Hepatitis A without hepatic coma: Secondary | ICD-10-CM | POA: Diagnosis present

## 2022-08-18 DIAGNOSIS — F1990 Other psychoactive substance use, unspecified, uncomplicated: Secondary | ICD-10-CM

## 2022-08-18 DIAGNOSIS — Z20822 Contact with and (suspected) exposure to covid-19: Secondary | ICD-10-CM | POA: Diagnosis present

## 2022-08-18 DIAGNOSIS — M797 Fibromyalgia: Secondary | ICD-10-CM | POA: Diagnosis present

## 2022-08-18 DIAGNOSIS — L03115 Cellulitis of right lower limb: Secondary | ICD-10-CM | POA: Diagnosis not present

## 2022-08-18 DIAGNOSIS — M65861 Other synovitis and tenosynovitis, right lower leg: Secondary | ICD-10-CM | POA: Diagnosis not present

## 2022-08-18 DIAGNOSIS — F410 Panic disorder [episodic paroxysmal anxiety] without agoraphobia: Secondary | ICD-10-CM | POA: Diagnosis present

## 2022-08-18 DIAGNOSIS — M00061 Staphylococcal arthritis, right knee: Secondary | ICD-10-CM | POA: Diagnosis not present

## 2022-08-18 DIAGNOSIS — M009 Pyogenic arthritis, unspecified: Secondary | ICD-10-CM

## 2022-08-18 DIAGNOSIS — M71161 Other infective bursitis, right knee: Secondary | ICD-10-CM | POA: Diagnosis present

## 2022-08-18 DIAGNOSIS — F411 Generalized anxiety disorder: Secondary | ICD-10-CM | POA: Diagnosis present

## 2022-08-18 DIAGNOSIS — Z7951 Long term (current) use of inhaled steroids: Secondary | ICD-10-CM

## 2022-08-18 DIAGNOSIS — K219 Gastro-esophageal reflux disease without esophagitis: Secondary | ICD-10-CM | POA: Diagnosis present

## 2022-08-18 DIAGNOSIS — Z803 Family history of malignant neoplasm of breast: Secondary | ICD-10-CM | POA: Diagnosis not present

## 2022-08-18 DIAGNOSIS — L0390 Cellulitis, unspecified: Secondary | ICD-10-CM | POA: Diagnosis not present

## 2022-08-18 DIAGNOSIS — B182 Chronic viral hepatitis C: Secondary | ICD-10-CM | POA: Diagnosis present

## 2022-08-18 LAB — PREGNANCY, URINE: Preg Test, Ur: NEGATIVE

## 2022-08-18 LAB — COMPREHENSIVE METABOLIC PANEL
ALT: 42 U/L (ref 0–44)
AST: 21 U/L (ref 15–41)
Albumin: 4.1 g/dL (ref 3.5–5.0)
Alkaline Phosphatase: 243 U/L — ABNORMAL HIGH (ref 38–126)
Anion gap: 7 (ref 5–15)
BUN: 14 mg/dL (ref 6–20)
CO2: 22 mmol/L (ref 22–32)
Calcium: 9.6 mg/dL (ref 8.9–10.3)
Chloride: 107 mmol/L (ref 98–111)
Creatinine, Ser: 0.73 mg/dL (ref 0.44–1.00)
GFR, Estimated: >60 mL/min (ref >60)
Glucose, Bld: 124 mg/dL — ABNORMAL HIGH (ref 70–99)
Potassium: 4.2 mmol/L (ref 3.5–5.1)
Sodium: 136 mmol/L (ref 135–145)
Total Bilirubin: 0.7 mg/dL (ref 0.3–1.2)
Total Protein: 6.9 g/dL (ref 6.5–8.1)

## 2022-08-18 LAB — RESP PANEL BY RT-PCR (FLU A&B, COVID) ARPGX2
Influenza A by PCR: NEGATIVE
Influenza B by PCR: NEGATIVE
SARS Coronavirus 2 by RT PCR: NEGATIVE

## 2022-08-18 LAB — CBC WITH DIFFERENTIAL/PLATELET
Abs Immature Granulocytes: 0.05 10*3/uL (ref 0.00–0.07)
Basophils Absolute: 0 10*3/uL (ref 0.0–0.1)
Basophils Relative: 0 %
Eosinophils Absolute: 0.2 10*3/uL (ref 0.0–0.5)
Eosinophils Relative: 1 %
HCT: 43.2 % (ref 36.0–46.0)
Hemoglobin: 14.2 g/dL (ref 12.0–15.0)
Immature Granulocytes: 0 %
Lymphocytes Relative: 14 %
Lymphs Abs: 1.6 10*3/uL (ref 0.7–4.0)
MCH: 29.8 pg (ref 26.0–34.0)
MCHC: 32.9 g/dL (ref 30.0–36.0)
MCV: 90.8 fL (ref 80.0–100.0)
Monocytes Absolute: 0.5 10*3/uL (ref 0.1–1.0)
Monocytes Relative: 5 %
Neutro Abs: 9 10*3/uL — ABNORMAL HIGH (ref 1.7–7.7)
Neutrophils Relative %: 80 %
Platelets: 212 10*3/uL (ref 150–400)
RBC: 4.76 MIL/uL (ref 3.87–5.11)
RDW: 15.1 % (ref 11.5–15.5)
WBC: 11.4 10*3/uL — ABNORMAL HIGH (ref 4.0–10.5)
nRBC: 0 % (ref 0.0–0.2)

## 2022-08-18 LAB — URINALYSIS, ROUTINE W REFLEX MICROSCOPIC
Bilirubin Urine: NEGATIVE
Glucose, UA: NEGATIVE mg/dL
Hgb urine dipstick: NEGATIVE
Ketones, ur: NEGATIVE mg/dL
Leukocytes,Ua: NEGATIVE
Nitrite: NEGATIVE
Specific Gravity, Urine: 1.029 (ref 1.005–1.030)
pH: 6 (ref 5.0–8.0)

## 2022-08-18 LAB — PROTIME-INR
INR: 1.1 (ref 0.8–1.2)
Prothrombin Time: 13.9 seconds (ref 11.4–15.2)

## 2022-08-18 LAB — HCG, SERUM, QUALITATIVE: Preg, Serum: NEGATIVE

## 2022-08-18 LAB — APTT: aPTT: 29 seconds (ref 24–36)

## 2022-08-18 LAB — LACTIC ACID, PLASMA
Lactic Acid, Venous: 0.5 mmol/L (ref 0.5–1.9)
Lactic Acid, Venous: 0.5 mmol/L (ref 0.5–1.9)

## 2022-08-18 MED ORDER — LACTATED RINGERS IV SOLN: INTRAVENOUS | Status: AC

## 2022-08-18 MED ORDER — MOMETASONE FURO-FORMOTEROL FUM 200-5 MCG/ACT IN AERO
2.0000 | INHALATION_SPRAY | Freq: Two times a day (BID) | RESPIRATORY_TRACT | Status: DC
  Administered 2022-08-19 – 2022-08-25 (×13): 2 via RESPIRATORY_TRACT
  Filled 2022-08-18 (×2): qty 8.8

## 2022-08-18 MED ORDER — SODIUM CHLORIDE 0.9 % IV SOLN
2.0000 g | Freq: Once | INTRAVENOUS | Status: AC
  Administered 2022-08-18: 2 g via INTRAVENOUS
  Filled 2022-08-18: qty 12.5

## 2022-08-18 MED ORDER — MONTELUKAST SODIUM 10 MG PO TABS
10.0000 mg | ORAL_TABLET | Freq: Every day | ORAL | Status: DC
  Administered 2022-08-18 – 2022-08-25 (×8): 10 mg via ORAL
  Filled 2022-08-18 (×8): qty 1

## 2022-08-18 MED ORDER — ACETAMINOPHEN 325 MG PO TABS
650.0000 mg | ORAL_TABLET | Freq: Four times a day (QID) | ORAL | Status: DC | PRN
  Administered 2022-08-24: 650 mg via ORAL
  Filled 2022-08-18 (×4): qty 2

## 2022-08-18 MED ORDER — LACTATED RINGERS IV BOLUS (SEPSIS)
1000.0000 mL | Freq: Once | INTRAVENOUS | Status: AC
  Administered 2022-08-18: 1000 mL via INTRAVENOUS

## 2022-08-18 MED ORDER — BUSPIRONE HCL 10 MG PO TABS
30.0000 mg | ORAL_TABLET | Freq: Three times a day (TID) | ORAL | Status: AC
  Administered 2022-08-19 – 2022-08-25 (×20): 30 mg via ORAL
  Filled 2022-08-18 (×21): qty 3

## 2022-08-18 MED ORDER — ACETAMINOPHEN 650 MG RE SUPP: 650.0000 mg | Freq: Four times a day (QID) | RECTAL | Status: DC | PRN

## 2022-08-18 MED ORDER — HYDROMORPHONE HCL 1 MG/ML IJ SOLN
0.5000 mg | INTRAMUSCULAR | Status: AC | PRN
  Administered 2022-08-18 – 2022-08-19 (×2): 1 mg via INTRAVENOUS
  Filled 2022-08-18 (×2): qty 1

## 2022-08-18 MED ORDER — SOFOSBUVIR-VELPATASVIR 400-100 MG PO TABS: 1.0000 | ORAL_TABLET | Freq: Every day | ORAL | Status: DC

## 2022-08-18 MED ORDER — LAMOTRIGINE 25 MG PO TABS
50.0000 mg | ORAL_TABLET | Freq: Every day | ORAL | Status: DC
  Administered 2022-08-19 – 2022-08-26 (×8): 50 mg via ORAL
  Filled 2022-08-18 (×9): qty 2

## 2022-08-18 MED ORDER — VANCOMYCIN HCL 1500 MG/300ML IV SOLN
1500.0000 mg | Freq: Two times a day (BID) | INTRAVENOUS | Status: DC
  Administered 2022-08-19 – 2022-08-23 (×10): 1500 mg via INTRAVENOUS
  Filled 2022-08-18 (×12): qty 300

## 2022-08-18 MED ORDER — OXYCODONE-ACETAMINOPHEN 5-325 MG PO TABS
1.0000 | ORAL_TABLET | Freq: Once | ORAL | Status: AC
  Administered 2022-08-18: 1 via ORAL
  Filled 2022-08-18: qty 1

## 2022-08-18 MED ORDER — VANCOMYCIN HCL IN DEXTROSE 1-5 GM/200ML-% IV SOLN
1000.0000 mg | Freq: Once | INTRAVENOUS | Status: AC
  Administered 2022-08-18: 1000 mg via INTRAVENOUS
  Filled 2022-08-18: qty 200

## 2022-08-18 MED ORDER — QUETIAPINE FUMARATE 25 MG PO TABS
50.0000 mg | ORAL_TABLET | Freq: Every day | ORAL | Status: DC
  Administered 2022-08-18 – 2022-08-25 (×8): 50 mg via ORAL
  Filled 2022-08-18 (×8): qty 2

## 2022-08-18 MED ORDER — POLYETHYLENE GLYCOL 3350 17 G PO PACK: 17.0000 g | PACK | Freq: Every day | ORAL | Status: DC | PRN

## 2022-08-18 MED ORDER — SODIUM CHLORIDE 0.9 % IV SOLN
1.0000 g | INTRAVENOUS | Status: DC
  Filled 2022-08-18: qty 10

## 2022-08-18 MED ORDER — HYDROCODONE-ACETAMINOPHEN 5-325 MG PO TABS
1.0000 | ORAL_TABLET | Freq: Once | ORAL | Status: AC
  Administered 2022-08-18: 1 via ORAL
  Filled 2022-08-18: qty 1

## 2022-08-18 MED ORDER — TIZANIDINE HCL 4 MG PO TABS
4.0000 mg | ORAL_TABLET | Freq: Three times a day (TID) | ORAL | Status: DC | PRN
  Administered 2022-08-19 – 2022-08-20 (×2): 4 mg via ORAL
  Filled 2022-08-18 (×2): qty 1

## 2022-08-18 MED ORDER — ENOXAPARIN SODIUM 40 MG/0.4ML IJ SOSY
40.0000 mg | PREFILLED_SYRINGE | INTRAMUSCULAR | Status: DC
  Filled 2022-08-18: qty 0.4

## 2022-08-18 MED ORDER — PANTOPRAZOLE SODIUM 40 MG PO TBEC
40.0000 mg | DELAYED_RELEASE_TABLET | Freq: Every day | ORAL | Status: DC
  Administered 2022-08-19 – 2022-08-26 (×8): 40 mg via ORAL
  Filled 2022-08-18 (×9): qty 1

## 2022-08-18 MED ORDER — VORTIOXETINE HBR 5 MG PO TABS: 5.0000 mg | ORAL_TABLET | Freq: Every day | ORAL | Status: DC

## 2022-08-18 MED ORDER — SODIUM CHLORIDE 0.9% FLUSH
3.0000 mL | Freq: Two times a day (BID) | INTRAVENOUS | Status: DC
  Administered 2022-08-18 – 2022-08-25 (×9): 3 mL via INTRAVENOUS

## 2022-08-18 MED ORDER — DIPHENHYDRAMINE HCL 25 MG PO CAPS
25.0000 mg | ORAL_CAPSULE | Freq: Once | ORAL | Status: AC
  Administered 2022-08-19: 25 mg via ORAL
  Filled 2022-08-18: qty 1

## 2022-08-18 MED ORDER — PREGABALIN 75 MG PO CAPS
225.0000 mg | ORAL_CAPSULE | Freq: Three times a day (TID) | ORAL | Status: DC
  Administered 2022-08-19 – 2022-08-26 (×20): 225 mg via ORAL
  Filled 2022-08-18 (×20): qty 3

## 2022-08-18 MED ORDER — ONDANSETRON HCL 4 MG/2ML IJ SOLN
4.0000 mg | Freq: Four times a day (QID) | INTRAMUSCULAR | Status: DC | PRN
  Administered 2022-08-20 – 2022-08-25 (×16): 4 mg via INTRAVENOUS
  Filled 2022-08-18 (×17): qty 2

## 2022-08-18 MED ORDER — ALBUTEROL SULFATE (2.5 MG/3ML) 0.083% IN NEBU: 3.0000 mL | INHALATION_SOLUTION | Freq: Four times a day (QID) | RESPIRATORY_TRACT | Status: DC | PRN

## 2022-08-18 MED ORDER — SODIUM CHLORIDE 0.9 % IV SOLN: 1.0000 g | Freq: Once | INTRAVENOUS | Status: DC

## 2022-08-18 MED ORDER — HYDROCODONE-ACETAMINOPHEN 5-325 MG PO TABS
1.0000 | ORAL_TABLET | Freq: Four times a day (QID) | ORAL | Status: DC | PRN
  Administered 2022-08-19 – 2022-08-24 (×9): 1 via ORAL
  Filled 2022-08-18 (×10): qty 1
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
## 2022-08-18 NOTE — Progress Notes (Signed)
Pharmacy Antibiotic Note  Claire [name] is a 36 y.o. female admitted on 08/18/2022 with medical history significant of hypertension, asthma, GERD, obesity, depression, anxiety, hepatitis C, IV drug use, history of osteomyelitis and necrotizing fasciitis presenting with right knee pain swelling and redness.  Pharmacy has been consulted to dose vancomycin for cellulitis.  1st dose given in the ED  Plan: Vancomycin 1500mg  IV q12h (AUC 527.4, used Scr 0.8) Follow renal function, cultures and clinical course  Height: 5\' 10"  (177.8 cm) Weight: 86.2 kg (190 lb) IBW/kg (Calculated) : 68.5  Temp (24hrs), Avg:99.1 F (37.3 C), Min:98.4 F (36.9 C), Max:99.9 F (37.7 C)  Recent Labs  Lab 08/18/22 1816  WBC 11.4*  CREATININE 0.73  LATICACIDVEN 0.5    Estimated Creatinine Clearance: 116 mL/min (by C-G formula based on SCr of 0.73 mg/dL).    Allergies  Allergen Reactions   Gadolinium Derivatives Anaphylaxis and Rash   Morphine And Related Itching    "If given with benadryl its fine"    Antimicrobials this admission: 9/20 vanc >> 9/20 cefepime x 1 Dose adjustments this admission:   Microbiology results: 9/20 BCx:   Thank you for allowing pharmacy to be a part of this patient's care.  Dolly Rias RPh 08/18/2022, 11:32 PM

## 2022-08-18 NOTE — ED Triage Notes (Signed)
Patient reports to the ER for right knee infection. Patient reports she fell and gashed it open on Friday and it has since become infected

## 2022-08-18 NOTE — Sepsis Progress Note (Signed)
Elink following Code Sepsis. 

## 2022-08-18 NOTE — ED Provider Notes (Signed)
[facility] EMERGENCY DEPT Provider Note   CSN: [id number] Arrival date & time: 08/18/22  1737     History {Add pertinent medical, surgical, social history, OB history to HPI:1} Chief Complaint  Patient presents with   Knee Pain    Claire [name] is a 36 y.o. female.  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  States she fell onto a wooden floor about 5 days ago and sustained a laceration to her right anterior knee.  She cleaned it well but has had increasing pain, redness, swelling since then with some clear drainage.  Has had chills at home but not checked her temperature.  Took 2 pills of amoxicillin which she had at home yesterday due to her previous history of necrotizing fasciitis.  She has increasing pain and swelling today with difficulty ranging the knee.  Denies any abdominal pain or vomiting.  Denies any chest pain, shortness of breath, cough, runny nose or sore throat.  Believes she had a fever at home but did not check her temperature.  States she has been clean from IV drugs for the past 7 months.   Knee Pain Associated symptoms: fever        Home Medications Prior to Admission medications   Medication Sig Start Date End Date Taking? Authorizing Provider  albuterol (PROVENTIL HFA;VENTOLIN HFA) 108 (90 BASE) MCG/ACT inhaler Inhale 2 puffs into the lungs every 6 (six) hours as needed. Wheezing     [provider]  aspirin-acetaminophen-caffeine (EXCEDRIN MIGRAINE) [phone] MG tablet Take 2 tablets by mouth every 6 (six) hours as needed (pain). Patient not taking: Reported on 05/27/2022    [provider]  budesonide-formoterol (SYMBICORT) 160-4.5 MCG/ACT inhaler Inhale 2 puffs into the lungs 2 (two) times daily.    [provider]  buPROPion (WELLBUTRIN XL) 300 MG 24 hr tablet Take 300 mg by mouth daily. Patient  not taking: Reported on 05/27/2022    [provider]  busPIRone (BUSPAR) 30 MG tablet Take 30 mg by mouth in the morning, at noon, and at bedtime. 07/07/21   [provider]  Cholecalciferol (VITAMIN D) 2000 UNITS tablet Take 2,000 Units by mouth daily.    [provider]  cyanocobalamin (,VITAMIN B-12,) 1000 MCG/ML injection Inject 100 mcg into the muscle every 30 (thirty) days. 01/22/15   [provider]  diphenhydrAMINE (BENADRYL) 25 MG tablet Take 25 mg by mouth every 6 (six) hours as needed.    [provider]  dronabinol (MARINOL) 5 MG capsule Take 5 mg by mouth 2 (two) times daily as needed. Nausea     [provider]  EPCLUSA 400-100 MG TABS Take 1 tablet by mouth daily. 07/29/22   Jennette Kettle, RPH-CPP  ibuprofen (ADVIL) 200 MG tablet Take 800 mg by mouth every 6 (six) hours as needed.    [provider]  montelukast (SINGULAIR) 10 MG tablet Take 10 mg by mouth at bedtime. Patient not taking: Reported on 05/27/2022    [provider]  mupirocin ointment (BACTROBAN) 2 % Apply 1 application. topically 3 (three) times daily as needed (irritation). Patient not taking: Reported on 05/27/2022    [provider]  omeprazole (PRILOSEC) 40 MG capsule Take 40 mg by mouth in the morning and at bedtime. 10/25/18   [provider]  oxyCODONE (OXY IR/ROXICODONE) 5 MG immediate release tablet Take 1 tablet (  5 mg total) by mouth every 6 (six) hours as needed for moderate pain or severe pain. Patient not taking: Reported on 05/27/2022 02/20/22   Glade LloydAlekh, Kshitiz, MD  polyethylene glycol (MIRALAX / GLYCOLAX) 17 g packet Take 17 g by mouth daily as needed for moderate constipation. Patient not taking: Reported on 05/27/2022 02/20/22   Glade LloydAlekh, Kshitiz, MD  pregabalin (LYRICA) 225 MG capsule Take 225 mg by mouth 3 (three) times daily. 01/18/22   [provider]  promethazine (PHENERGAN) 25 MG tablet Take 25 mg by mouth  every 6 (six) hours as needed for nausea or vomiting. 11/27/21   [provider]  QUEtiapine (SEROQUEL) 50 MG tablet Take 50 mg by mouth at bedtime. 04/21/22   [provider]  rizatriptan (MAXALT) 10 MG tablet Take 10 mg by mouth as needed for migraine. 02/05/21   [provider]  senna-docusate (SENOKOT-S) 8.6-50 MG tablet Take 1 tablet by mouth 2 (two) times daily. Patient not taking: Reported on 05/27/2022 02/20/22   Glade LloydAlekh, Kshitiz, MD  sertraline (ZOLOFT) 50 MG tablet Take 50 mg by mouth daily. Patient not taking: Reported on 05/27/2022 11/16/21   [provider]  tiZANidine (ZANAFLEX) 4 MG capsule Take 1 capsule (4 mg total) by mouth 3 (three) times daily as needed for muscle spasms. 02/20/22   Glade LloydAlekh, Kshitiz, MD  traZODone (DESYREL) 100 MG tablet Take 100 mg by mouth at bedtime. Patient not taking: Reported on 05/27/2022 01/16/21   [provider]  Vortioxetine HBr (TRINTELLIX PO) Take by mouth.    [provider]      Allergies    Gadolinium derivatives and Morphine and related    Review of Systems   Review of Systems  Constitutional:  Positive for fever.  Musculoskeletal:  Positive for arthralgias and myalgias.  Skin:  Positive for wound.   all other systems are negative except as noted in the HPI and PMH.    Physical Exam Updated Vital Signs BP (!) 134/96 (BP Location: Right Arm)   Pulse 86   Temp 99.9 F (37.7 C)   Resp 20   Ht 5\' 10"  (1.778 m)   Wt 86.2 kg   SpO2 99%   BMI 27.26 kg/m  Physical Exam Vitals and nursing note reviewed.  Constitutional:      General: She is not in acute distress.    Appearance: She is well-developed.  HENT:     Head: Normocephalic and atraumatic.     Mouth/Throat:     Pharynx: No oropharyngeal exudate.  Eyes:     Conjunctiva/sclera: Conjunctivae normal.     Pupils: Pupils are equal, round, and reactive to light.  Neck:     Comments: No meningismus. Cardiovascular:     Rate and  Rhythm: Regular rhythm. Tachycardia present.     Heart sounds: Normal heart sounds. No murmur heard. Pulmonary:     Effort: Pulmonary effort is normal. No respiratory distress.     Breath sounds: Normal breath sounds.  Abdominal:     Palpations: Abdomen is soft.     Tenderness: There is no abdominal tenderness. There is no guarding or rebound.  Musculoskeletal:        General: Swelling and tenderness present.     Cervical back: Normal range of motion and neck supple.     Comments: Diffuse erythema overlying right knee anteriorly spreading laterally.  Open draining clear wound as depicted.  Flexion and extension limited by pain.  Intact DP and PT pulse.  Skin:  General: Skin is warm.  Neurological:     Mental Status: She is alert and oriented to person, place, and time.     Cranial Nerves: No cranial nerve deficit.     Motor: No abnormal muscle tone.     Coordination: Coordination normal.     Comments:  5/5 strength throughout. CN 2-12 intact.Equal grip strength.   Psychiatric:        Behavior: Behavior normal.        ED Results / Procedures / Treatments   Labs (all labs ordered are listed, but only abnormal results are displayed) Labs Reviewed  COMPREHENSIVE METABOLIC PANEL - Abnormal; Notable for the following components:      Result Value   Glucose, Bld 124 (*)    Alkaline Phosphatase 243 (*)    All other components within normal limits  CBC WITH DIFFERENTIAL/PLATELET - Abnormal; Notable for the following components:   WBC 11.4 (*)    Neutro Abs 9.0 (*)    All other components within normal limits  CULTURE, BLOOD (ROUTINE X 2)  CULTURE, BLOOD (ROUTINE X 2)  RESP PANEL BY RT-PCR (FLU A&B, COVID) ARPGX2  LACTIC ACID, PLASMA  HCG, SERUM, QUALITATIVE  LACTIC ACID, PLASMA  PROTIME-INR  APTT  URINALYSIS, ROUTINE W REFLEX MICROSCOPIC  PREGNANCY, URINE    EKG EKG Interpretation  Date/Time:  Wednesday August 18 2022 18:59:50 EDT Ventricular Rate:  85 PR  Interval:  136 QRS Duration: 96 QT Interval:  361 QTC Calculation: 430 R Axis:   70 Text Interpretation: Sinus or ectopic atrial rhythm Probable left ventricular hypertrophy No significant change was found Confirmed by Ezequiel Essex [phone]) on 08/18/2022 7:02:09 PM  Radiology DG Knee Complete 4 Views Right  Result Date: 08/18/2022 CLINICAL DATA:  Questionable sepsis - evaluate for abnormality Patient reports fall and possible infection. EXAM: RIGHT KNEE - COMPLETE 4+ VIEW COMPARISON:  None Available. FINDINGS: No evidence of fracture, dislocation, or joint effusion. No evidence of arthropathy or other focal bone abnormality. No erosion or periostitis. Generalized anterior and lateral soft tissue edema. No soft tissue gas. No radiopaque foreign body. IMPRESSION: Soft tissue edema without acute osseous abnormality. No soft tissue gas. Electronically Signed   By: Keith Rake M.D.   On: 08/18/2022 19:38   DG Chest Port 1 View  Result Date: 08/18/2022 CLINICAL DATA:  Questionable sepsis - evaluate for abnormality EXAM: PORTABLE CHEST 1 VIEW COMPARISON:  Radiograph 04/26/2022 FINDINGS: The cardiomediastinal contours are normal. The lungs are clear. Pulmonary vasculature is normal. No consolidation, pleural effusion, or pneumothorax. No acute osseous abnormalities are seen. Multiple overlying monitoring devices. IMPRESSION: No acute chest findings. Electronically Signed   By: Keith Rake M.D.   On: 08/18/2022 19:37    Procedures Procedures  {Document cardiac monitor, telemetry assessment procedure when appropriate:1}  Medications Ordered in ED Medications  lactated ringers infusion (has no administration in time range)  lactated ringers bolus 1,000 mL (has no administration in time range)  vancomycin (VANCOCIN) IVPB 1000 mg/200 mL premix (has no administration in time range)  ceFEPIme (MAXIPIME) 1 g in sodium chloride 0.9 % 100 mL IVPB (has no administration in time range)    ED  Course/ Medical Decision Making/ A&P                           Medical Decision Making Amount and/or Complexity of Data Reviewed Labs: ordered. Radiology: ordered. ECG/medicine tests: ordered.  Risk Prescription drug management. Decision regarding hospitalization.  Cellulitis with likely sepsis of her knee.  On arrival patient is tachycardic and febrile.  Obvious cellulitis as depicted.  Code sepsis activated and started on broad-spectrum antibiotics after IV fluids are obtained.  Lactate is normal.  Leukocytosis of 11.  Patient tachycardic and febrile on arrival.  X-ray of knee shows soft tissue edema but no fracture and no soft tissue gas.  She is started on broad-spectrum antibiotics for history of cellulitis with history of necrotizing fasciitis but does not appear to have necrotizing fasciitis currently.  Septic joint is considered but given patient's diffuse erythema of the leg it is likely unsafe to perform arthrocentesis at this time.  Borderline sepsis but normal lactic acid level.  Patient given IV fluids and broad-spectrum antibiotics blood cultures are pending.  Concern for her recurrent infections as well as previous IV drug abuse and previous cases of necrotizing fasciitis.  Does not appear to have necrotizing fasciitis today.  Septic joint considered less likely given overlying cellulitis.  Unsafe to arthrocentesis at this time but may require Ortho evaluation in the morning.  Admission discussed with Dr. Antionette Char {Document critical care time when appropriate:1} {Document review of labs and clinical decision tools ie heart score, Chads2Vasc2 etc:1}  {Document your independent review of radiology images, and any outside records:1} {Document your discussion with family members, caretakers, and with consultants:1} {Document social determinants of health affecting pt's care:1} {Document your decision making why or why not admission, treatments were needed:1} Final Clinical  Impression(s) / ED Diagnoses Final diagnoses:  None    Rx / DC Orders ED Discharge Orders     None

## 2022-08-19 ENCOUNTER — Observation Stay (HOSPITAL_COMMUNITY): Payer: 59

## 2022-08-19 ENCOUNTER — Inpatient Hospital Stay (HOSPITAL_COMMUNITY): Payer: 59

## 2022-08-19 DIAGNOSIS — Z888 Allergy status to other drugs, medicaments and biological substances status: Secondary | ICD-10-CM | POA: Diagnosis not present

## 2022-08-19 DIAGNOSIS — B182 Chronic viral hepatitis C: Secondary | ICD-10-CM | POA: Diagnosis present

## 2022-08-19 DIAGNOSIS — L03115 Cellulitis of right lower limb: Secondary | ICD-10-CM | POA: Diagnosis present

## 2022-08-19 DIAGNOSIS — Z7951 Long term (current) use of inhaled steroids: Secondary | ICD-10-CM | POA: Diagnosis not present

## 2022-08-19 DIAGNOSIS — F1990 Other psychoactive substance use, unspecified, uncomplicated: Secondary | ICD-10-CM | POA: Diagnosis not present

## 2022-08-19 DIAGNOSIS — L0390 Cellulitis, unspecified: Secondary | ICD-10-CM | POA: Diagnosis present

## 2022-08-19 DIAGNOSIS — J45909 Unspecified asthma, uncomplicated: Secondary | ICD-10-CM | POA: Diagnosis present

## 2022-08-19 DIAGNOSIS — B159 Hepatitis A without hepatic coma: Secondary | ICD-10-CM | POA: Diagnosis present

## 2022-08-19 DIAGNOSIS — M65861 Other synovitis and tenosynovitis, right lower leg: Secondary | ICD-10-CM | POA: Diagnosis present

## 2022-08-19 DIAGNOSIS — F17210 Nicotine dependence, cigarettes, uncomplicated: Secondary | ICD-10-CM | POA: Diagnosis present

## 2022-08-19 DIAGNOSIS — M797 Fibromyalgia: Secondary | ICD-10-CM | POA: Diagnosis present

## 2022-08-19 DIAGNOSIS — Z803 Family history of malignant neoplasm of breast: Secondary | ICD-10-CM | POA: Diagnosis not present

## 2022-08-19 DIAGNOSIS — F410 Panic disorder [episodic paroxysmal anxiety] without agoraphobia: Secondary | ICD-10-CM | POA: Diagnosis present

## 2022-08-19 DIAGNOSIS — F418 Other specified anxiety disorders: Secondary | ICD-10-CM | POA: Diagnosis not present

## 2022-08-19 DIAGNOSIS — K219 Gastro-esophageal reflux disease without esophagitis: Secondary | ICD-10-CM | POA: Diagnosis present

## 2022-08-19 DIAGNOSIS — M00061 Staphylococcal arthritis, right knee: Secondary | ICD-10-CM | POA: Diagnosis present

## 2022-08-19 DIAGNOSIS — M71161 Other infective bursitis, right knee: Secondary | ICD-10-CM | POA: Diagnosis present

## 2022-08-19 DIAGNOSIS — Z79899 Other long term (current) drug therapy: Secondary | ICD-10-CM | POA: Diagnosis not present

## 2022-08-19 DIAGNOSIS — F411 Generalized anxiety disorder: Secondary | ICD-10-CM | POA: Diagnosis present

## 2022-08-19 DIAGNOSIS — I10 Essential (primary) hypertension: Secondary | ICD-10-CM | POA: Diagnosis present

## 2022-08-19 DIAGNOSIS — M009 Pyogenic arthritis, unspecified: Secondary | ICD-10-CM | POA: Diagnosis not present

## 2022-08-19 DIAGNOSIS — Z20822 Contact with and (suspected) exposure to covid-19: Secondary | ICD-10-CM | POA: Diagnosis present

## 2022-08-19 DIAGNOSIS — F332 Major depressive disorder, recurrent severe without psychotic features: Secondary | ICD-10-CM | POA: Diagnosis present

## 2022-08-19 LAB — CBC
HCT: 37 % (ref 36.0–46.0)
Hemoglobin: 11.7 g/dL — ABNORMAL LOW (ref 12.0–15.0)
MCH: 29.7 pg (ref 26.0–34.0)
MCHC: 31.6 g/dL (ref 30.0–36.0)
MCV: 93.9 fL (ref 80.0–100.0)
Platelets: 161 10*3/uL (ref 150–400)
RBC: 3.94 MIL/uL (ref 3.87–5.11)
RDW: 15 % (ref 11.5–15.5)
WBC: 6 10*3/uL (ref 4.0–10.5)
nRBC: 0 % (ref 0.0–0.2)

## 2022-08-19 LAB — BASIC METABOLIC PANEL
Anion gap: 5 (ref 5–15)
BUN: 13 mg/dL (ref 6–20)
CO2: 22 mmol/L (ref 22–32)
Calcium: 8.5 mg/dL — ABNORMAL LOW (ref 8.9–10.3)
Chloride: 111 mmol/L (ref 98–111)
Creatinine, Ser: 0.69 mg/dL (ref 0.44–1.00)
GFR, Estimated: >60 mL/min (ref >60)
Glucose, Bld: 97 mg/dL (ref 70–99)
Potassium: 3.3 mmol/L — ABNORMAL LOW (ref 3.5–5.1)
Sodium: 138 mmol/L (ref 135–145)

## 2022-08-19 LAB — SYNOVIAL CELL COUNT + DIFF, W/ CRYSTALS
Crystals, Fluid: NONE SEEN
Eosinophils-Synovial: 1 % (ref 0–1)
Lymphocytes-Synovial Fld: 5 % (ref 0–20)
Monocyte-Macrophage-Synovial Fluid: 30 % — ABNORMAL LOW (ref 50–90)
Neutrophil, Synovial: 64 % — ABNORMAL HIGH (ref 0–25)
WBC, Synovial: 513 /mm3 — ABNORMAL HIGH (ref 0–200)

## 2022-08-19 MED ORDER — LORATADINE 10 MG PO TABS
10.0000 mg | ORAL_TABLET | Freq: Every day | ORAL | Status: DC | PRN
  Administered 2022-08-19: 10 mg via ORAL
  Filled 2022-08-19: qty 1

## 2022-08-19 MED ORDER — HYDROMORPHONE HCL 1 MG/ML IJ SOLN
0.5000 mg | Freq: Four times a day (QID) | INTRAMUSCULAR | Status: DC | PRN
  Administered 2022-08-19 – 2022-08-25 (×20): 0.5 mg via INTRAVENOUS
  Filled 2022-08-19 (×23): qty 0.5

## 2022-08-19 MED ORDER — DIPHENHYDRAMINE HCL 25 MG PO CAPS
25.0000 mg | ORAL_CAPSULE | Freq: Four times a day (QID) | ORAL | Status: DC | PRN
  Administered 2022-08-19 – 2022-08-22 (×5): 25 mg via ORAL
  Filled 2022-08-19 (×6): qty 1

## 2022-08-19 MED ORDER — MELATONIN 3 MG PO TABS
3.0000 mg | ORAL_TABLET | Freq: Once | ORAL | Status: AC
  Administered 2022-08-19: 3 mg via ORAL
  Filled 2022-08-19: qty 1

## 2022-08-19 MED ORDER — LORAZEPAM 0.5 MG PO TABS
0.5000 mg | ORAL_TABLET | Freq: Once | ORAL | Status: AC | PRN
  Administered 2022-08-19: 0.5 mg via ORAL
  Filled 2022-08-19: qty 1

## 2022-08-19 MED ORDER — HYDROCORTISONE 1 % EX CREA
1.0000 | TOPICAL_CREAM | Freq: Three times a day (TID) | CUTANEOUS | Status: DC | PRN
  Administered 2022-08-19: 1 via TOPICAL
  Filled 2022-08-19: qty 28

## 2022-08-19 MED ORDER — HYDROMORPHONE HCL 1 MG/ML IJ SOLN
0.5000 mg | Freq: Once | INTRAMUSCULAR | Status: AC
  Administered 2022-08-19: 0.5 mg via INTRAVENOUS
  Filled 2022-08-19: qty 0.5

## 2022-08-19 MED ORDER — HYDROXYZINE HCL 25 MG PO TABS
25.0000 mg | ORAL_TABLET | Freq: Once | ORAL | Status: AC
  Administered 2022-08-19: 25 mg via ORAL
  Filled 2022-08-19: qty 1

## 2022-08-19 MED ORDER — VORTIOXETINE HBR 5 MG PO TABS
20.0000 mg | ORAL_TABLET | Freq: Every day | ORAL | Status: DC
  Administered 2022-08-19 – 2022-08-25 (×7): 20 mg via ORAL
  Filled 2022-08-19 (×8): qty 4

## 2022-08-19 NOTE — Hospital Course (Addendum)
88 yof with hypertension asthma GERD obesity anxiety/depression hep C, IV drug use history of osteomyelitis and necrotizing fasciitis presents with right knee pain swelling and redness after she fell 5 days PTA, suffered a cut to her right knee.  Has been having still increasing redness swelling pain. Denied fever.  In the ED-afebrile, labs with mild leukocytosis normal lactic acid UA normal blood culture sent x-ray chest no acute finding, x-ray knee soft tissue edema without osseous abnormality, placed on IV antibiotics and admitted-orthopedic ID consulted MRI of the knee did not show septic arthritis, knee arthrocentesis done and fluid sent for culture and continued on IV vancomycin. Culture with rare Staphylococcus aureus-MRSA-sensitive to tetracycline clindamycin vancomycin.

## 2022-08-19 NOTE — Progress Notes (Signed)
Pt refuses to take tylenol d/t Hep C disease states Dr. Brett Canales told pt never to take. Pt request Ibuprofen.

## 2022-08-19 NOTE — Progress Notes (Signed)
PROGRESS NOTE Claire [name]  ZOX:[id number] DOB: 10-26-1986 DOA: 08/18/2022 PCP: Olive Bass, MD   Brief Narrative/Hospital Course: 24 Se[place] female with hypertension asthma GERD obesity anxiety/depression hep C, IV drug use history of osteomyelitis and necrotizing fasciitis presents with right knee pain swelling and redness after she fell 5 days PTA, suffered a cut to her right knee.  Has been having still increasing redness swelling pain. Denied fever.  In the ED-afebrile, labs with mild leukocytosis normal lactic acid UA normal blood culture sent x-ray chest no acute finding, x-ray knee soft tissue edema without osseous abnormality, placed on IV antibiotics and admitted  9/21: Overnight Tmax 99.9, leukocytosis resolved.     Subjective: Seen and examined this morning still complains of painful right knee joint erythema swelling painful movement Denies nausea vomiting chest pain RN reports she is requesting iv dilaudid and norco is "ineffective" and also asking for benadryl with dilaudid for "itching".  Assessment and Plan: Principal Problem:   Cellulitis of knee, right Active Problems:   Essential hypertension   Asthma, chronic   GERD   Severe recurrent major depression without psychotic features (HCC)   Cellulitis   GAD (generalized anxiety disorder)   Chronic hepatitis C without hepatic coma (HCC)   Right knee cellulitis Recent right knee quad/injury 5 days PTA History of necrotizing fasciitis osteomyelitis discitis in the past X-ray knee soft tissue swelling, initial leukocytosis has resolved.  given vancomycin and cefepime and continued on Vanco on admit-Blood culture sent.Has limited ROM on the right knee on exam-I discussed with orthopedics Dr. Odis Hollingshead, ordered MRI of the right knee stat to rule out septic arthritis and keeping n.p.o., patient is allergic to gadolinium.  Also consulted ID.   Asthma: On Singulair Dulera and bronchodilators Anxiety/depression: Mood  stable, on BuSpar Trintellix Seroquel Lamictal Hepatitis C: Continue home Epclusa prescribed by our CAD GERD on PPI History of IVDA-abstinencex7 months  DVT prophylaxis: enoxaparin (LOVENOX) injection 40 mg Start: 08/19/22 1000 Code Status:   Code Status: Full Code Family Communication: plan of care discussed with patient/family at bedside. Patient status is: Admitted as observation, remains hospitalized because of ongoing management of cellulitis, on IV antibiotics and to rule out septic arthritis  Level of care: Med-Surg  Dispo: The patient is from: home            Anticipated disposition: home Mobility Assessment (last 72 hours)     Mobility Assessment     Row Name 08/18/22 2245           Does patient have an order for bedrest or is patient medically unstable No - Continue assessment       What is the highest level of mobility based on the progressive mobility assessment? Level 5 (Walks with assist in room/hall) - Balance while stepping forward/back and can walk in room with assist - Complete                 Objective: Vitals last 24 hrs: Vitals:   08/18/22 2128 08/18/22 2145 08/18/22 2232 08/19/22 0206  BP: (!) 134/91 (!) 147/96 135/88 131/75  Pulse:  78 79 85  Resp: 18 18 18 20   Temp: 98.4 F (36.9 C)  99.1 F (37.3 C) 98.3 F (36.8 C)  TempSrc: Oral     SpO2: 97% 98% 98% 98%  Weight:      Height:       Weight change:   Physical Examination: General exam: alert awake,older than stated age, weak appearing. HEENT:Oral mucosa  moist, Ear/Nose WNL grossly, dentition normal. Respiratory system: bilaterally clear BS, no use of accessory muscle Cardiovascular system: S1 & S2 +, No JVD. Gastrointestinal system: Abdomen soft,NT,ND, BS+ Nervous System:Alert, awake, moving extremities and grossly nonfocal Extremities: LE edema neg, rt knee area- erythematous swollen tender redness seems to be somewhat better, ROM on rt knee limited due to pain Skin: No rashes,no  icterus. MSK: Normal muscle bulk,tone, power.   Medications reviewed:  Scheduled Meds:  busPIRone  30 mg Oral TID   enoxaparin (LOVENOX) injection  40 mg Subcutaneous Q24H   lamoTRIgine  50 mg Oral Daily   mometasone-formoterol  2 puff Inhalation BID   montelukast  10 mg Oral QHS   pantoprazole  40 mg Oral Daily   pregabalin  225 mg Oral TID   QUEtiapine  50 mg Oral QHS   sodium chloride flush  3 mL Intravenous Q12H   Sofosbuvir-Velpatasvir  1 tablet Oral Daily   vortioxetine HBr  5 mg Oral Daily   Continuous Infusions:  lactated ringers 150 mL/hr at 08/19/22 5053   vancomycin 1,500 mg (08/19/22 0908)    Diet Order             Diet NPO time specified  Diet effective now                  Unresulted Labs (From admission, onward)     Start     Ordered   08/25/22 0500  Creatinine, serum  (enoxaparin (LOVENOX)    CrCl >/= 30 ml/min)  Weekly,   R     Comments: while on enoxaparin therapy    08/18/22 2248   08/20/22 0500  Basic metabolic panel  Daily at 5am,   R      08/19/22 0904   08/20/22 0500  CBC  Daily at 5am,   R      08/19/22 0904   08/20/22 0500  HCV RNA quant  Tomorrow morning,   R        08/19/22 0954   08/20/22 0500  Hepatitis B surface antigen  Tomorrow morning,   R        08/19/22 0954   08/20/22 0500  Hepatitis B core antibody, total  Tomorrow morning,   R        08/19/22 0954   08/20/22 0500  Hepatitis B surface antibody,quantitative  Tomorrow morning,   R        08/19/22 0954   08/18/22 1828  Blood culture (routine x 2)  BLOOD CULTURE X 2,   R     Question Answer Comment  Patient immune status Immunocompromised   Release to patient Immediate      08/18/22 1827          Data Reviewed: I have personally reviewed following labs and imaging studies CBC: Recent Labs  Lab 08/18/22 1816 08/19/22 0523  WBC 11.4* 6.0  NEUTROABS 9.0*  --   HGB 14.2 11.7*  HCT 43.2 37.0  MCV 90.8 93.9  PLT 212 161   Basic Metabolic Panel: Recent Labs  Lab  08/18/22 1816 08/19/22 0523  NA 136 138  K 4.2 3.3*  CL 107 111  CO2 22 22  GLUCOSE 124* 97  BUN 14 13  CREATININE 0.73 0.69  CALCIUM 9.6 8.5*   GFR: Estimated Creatinine Clearance: 116 mL/min (by C-G formula based on SCr of 0.69 mg/dL). Liver Function Tests: Recent Labs  Lab 08/18/22 1816  AST 21  ALT 42  ALKPHOS 243*  BILITOT 0.7  PROT 6.9  ALBUMIN 4.1  Coagulation Profile: Recent Labs  Lab 08/18/22 1848  INR 1.1   ntimicrobials: Anti-infectives (From admission, onward)    Start     Dose/Rate Route Frequency Ordered Stop   08/19/22 1000  Sofosbuvir-Velpatasvir 400-100 MG TABS 1 tablet        1 tablet Oral Daily 08/18/22 2320     08/19/22 0800  cefTRIAXone (ROCEPHIN) 1 g in sodium chloride 0.9 % 100 mL IVPB  Status:  Discontinued        1 g 200 mL/hr over 30 Minutes Intravenous Every 24 hours 08/18/22 2248 08/18/22 2315   08/19/22 0800  vancomycin (VANCOREADY) IVPB 1500 mg/300 mL        1,500 mg 150 mL/hr over 120 Minutes Intravenous Every 12 hours 08/18/22 2334     08/18/22 1945  ceFEPIme (MAXIPIME) 2 g in sodium chloride 0.9 % 100 mL IVPB        2 g 200 mL/hr over 30 Minutes Intravenous  Once 08/18/22 1930 08/18/22 2028   08/18/22 1900  vancomycin (VANCOCIN) IVPB 1000 mg/200 mL premix        1,000 mg 200 mL/hr over 60 Minutes Intravenous  Once 08/18/22 1848 08/18/22 2125   08/18/22 1900  ceFEPIme (MAXIPIME) 1 g in sodium chloride 0.9 % 100 mL IVPB  Status:  Discontinued        1 g 200 mL/hr over 30 Minutes Intravenous  Once 08/18/22 1848 08/18/22 1930      Culture/Microbiology    Component Value Date/Time   SDES  02/14/2022 0145    BLOOD RIGHT ARM Performed at [facility], [address] [place], [place] [address]    [facility]  02/14/2022 0145    BOTTLES DRAWN AEROBIC AND ANAEROBIC Blood Culture adequate volume Performed at [facility], [address] [place], [place] [address]    CULT  02/14/2022 0145     NO GROWTH 5 DAYS Performed at [facility] [address], [place], [place] [address]    REPTSTATUS 02/19/2022 FINAL 02/14/2022 0145   Radiology Studies: DG Knee Complete 4 Views Right  Result Date: 08/18/2022 CLINICAL DATA:  Questionable sepsis - evaluate for abnormality Patient reports fall and possible infection. EXAM: RIGHT KNEE - COMPLETE 4+ VIEW COMPARISON:  None Available. FINDINGS: No evidence of fracture, dislocation, or joint effusion. No evidence of arthropathy or other focal bone abnormality. No erosion or periostitis. Generalized anterior and lateral soft tissue edema. No soft tissue gas. No radiopaque foreign body. IMPRESSION: Soft tissue edema without acute osseous abnormality. No soft tissue gas. Electronically Signed   By: Keith Rake M.D.   On: 08/18/2022 19:38   DG Chest Port 1 View  Result Date: 08/18/2022 CLINICAL DATA:  Questionable sepsis - evaluate for abnormality EXAM: PORTABLE CHEST 1 VIEW COMPARISON:  Radiograph 04/26/2022 FINDINGS: The cardiomediastinal contours are normal. The lungs are clear. Pulmonary vasculature is normal. No consolidation, pleural effusion, or pneumothorax. No acute osseous abnormalities are seen. Multiple overlying monitoring devices. IMPRESSION: No acute chest findings. Electronically Signed   By: Keith Rake M.D.   On: 08/18/2022 19:37     LOS: 0 days   Antonieta Pert, MD [facility]  08/19/2022, 10:51 AM
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
## 2022-08-19 NOTE — Procedures (Signed)
Procedure: Right knee aspiration   Indication: Right knee effusion rule out septic arthritis   Surgeon: Armond Hang MD   Assist: Kelli Hope RN   Anesthesia: None   EBL: None   Complications: None   Findings: After risks/benefits explained patient desires to undergo procedure. Verbal consent obtained and time out performed. The right knee was sterilely prepped and aspirated. 5 cc of serosanguinous fluid obtained with no signs of purulence. This was separated into an EDTA tube for cell count and the remaining aspiration fluid in syringe was sent for cultures. Pt tolerated the procedure well.   Armond Hang, MD Orthopaedic Surgery [facility]

## 2022-08-20 DIAGNOSIS — L03115 Cellulitis of right lower limb: Secondary | ICD-10-CM | POA: Diagnosis not present

## 2022-08-20 LAB — CBC
HCT: 43.4 % (ref 36.0–46.0)
Hemoglobin: 13.8 g/dL (ref 12.0–15.0)
MCH: 30.2 pg (ref 26.0–34.0)
MCHC: 31.8 g/dL (ref 30.0–36.0)
MCV: 95 fL (ref 80.0–100.0)
Platelets: 226 10*3/uL (ref 150–400)
RBC: 4.57 MIL/uL (ref 3.87–5.11)
RDW: 14.8 % (ref 11.5–15.5)
WBC: 5.2 10*3/uL (ref 4.0–10.5)
nRBC: 0 % (ref 0.0–0.2)

## 2022-08-20 LAB — BASIC METABOLIC PANEL
Anion gap: 6 (ref 5–15)
BUN: 10 mg/dL (ref 6–20)
CO2: 20 mmol/L — ABNORMAL LOW (ref 22–32)
Calcium: 8.6 mg/dL — ABNORMAL LOW (ref 8.9–10.3)
Chloride: 111 mmol/L (ref 98–111)
Creatinine, Ser: 0.63 mg/dL (ref 0.44–1.00)
GFR, Estimated: >60 mL/min (ref >60)
Glucose, Bld: 90 mg/dL (ref 70–99)
Potassium: 4.2 mmol/L (ref 3.5–5.1)
Sodium: 137 mmol/L (ref 135–145)

## 2022-08-20 LAB — HEPATITIS A ANTIBODY, TOTAL: hep A Total Ab: REACTIVE — AB

## 2022-08-20 MED ORDER — SOFOSBUVIR-VELPATASVIR 400-100 MG PO TABS
1.0000 | ORAL_TABLET | Freq: Every day | ORAL | Status: DC
  Administered 2022-08-20 – 2022-08-26 (×7): 1 via ORAL

## 2022-08-20 NOTE — Progress Notes (Signed)
[facility] Infectious Diseases Follow Up Note  Patient Identification: Patient Name: Claire [name] MRN: [id number] Admit Date: 08/18/2022  6:29 PM Age: 36 y.o.Today's Date: 08/20/2022  Reason for Visit: Cellulitis right knee, rule out septic arthritis  Principal Problem:   Cellulitis of knee, right Active Problems:   Essential hypertension   Asthma, chronic   GERD   Severe recurrent major depression without psychotic features (HCC)   Cellulitis   GAD (generalized anxiety disorder)   Chronic hepatitis C without hepatic coma (HCC)  Antibiotics:  Vancomycin 9/20-c Cefepime 9/20   Lines/Hardware: PIP  Interval Events: Remains afebrile, status post arthrocentesis by Ortho on 9/21   Assessment 36 year old female with PMH of anxiety, depression osteoarthritis, asthma, chronic back pain, discitis/vertebral osteomyelitis, fibromyalgia, GERD, substance abuse, necrotizing fasciitis status post multiple surgeries in her right UE, h/o substance use and hepatitis C admitted with   # Right knee cellulitis, r/o septic knee Status post arthrocentesis of right knee on 9/21.  WBC 513,N 64%, no crystals.  Cultures pending\ MRI right knee 9/21 , no evidence of septic arthritis, trace knee joint effusion; marked skin thickening and subcutaneous soft tissue edema about the anterior aspect of the distal thigh and knee  # Chronic Hepatitis C - Tells me he started taking her Epclusa approximately 1.5 weeks ago but did not take it yesterday as she did not have it here.   Recommendations Continue vancomycin, pharmacy to dose Follow-up joint fluid cultures Monitor for improvement of swelling and redness in rt knee I have advised patient to have family member bring Epclusa to avoid interruption in treatment for her hepatitis C   Dr. Drue SecondSnider covering this weekend and will monitor cultures. New  ID team will follow from Monday  Rest of the  management as per the primary team. Thank you for the consult. Please page with pertinent questions or concerns.  ______________________________________________________________________ Subjective patient seen and examined at the bedside.  She has some soreness at the site of right joint aspiration.  Otherwise no other symptoms  Vitals BP 129/75 (BP Location: Left Arm)   Pulse 73   Temp 98.5 F (36.9 C) (Oral)   Resp 18   Ht 5\' 10"  (1.778 m)   Wt 86.2 kg   SpO2 99%   BMI 27.26 kg/m    Physical Exam Constitutional: Lying in the bed and appears comfortable    Comments:   Cardiovascular:     Rate and Rhythm: Normal rate and regular rhythm.     Heart sounds:   Pulmonary:     Effort: Pulmonary effort is normal.     Comments: Normal breath sounds  Abdominal:     Palpations: Abdomen is soft.     Tenderness: Nondistended  Musculoskeletal:        General: Right knee with mild anterior swelling, redness and tenderness has improved.  She has increased passive and active range of motion in her right knee from yesterday.  RT leg and right foot within normal limit  Skin:    Comments: No obvious lesions or rashes  Neurological:     General: No focal deficit present.  Awake alert and oriented  Psychiatric:        Mood and Affect: Mood normal.   Pertinent Microbiology Results for orders placed or performed during the hospital encounter of 08/18/22  Resp Panel by RT-PCR (Flu A&B, Covid) Anterior Nasal Swab     Status: None   Collection Time: 08/18/22  7:50 PM   Specimen: Anterior  Nasal Swab  Result Value Ref Range Status   SARS Coronavirus 2 by RT PCR NEGATIVE NEGATIVE Final    Comment: (NOTE) SARS-CoV-2 target nucleic acids are NOT DETECTED.  The SARS-CoV-2 RNA is generally detectable in upper respiratory specimens during the acute phase of infection. The lowest concentration of SARS-CoV-2 viral copies this assay can detect is 138 copies/mL. A negative result does not  preclude SARS-Cov-2 infection and should not be used as the sole basis for treatment or other patient management decisions. A negative result may occur with  improper specimen collection/handling, submission of specimen other than nasopharyngeal swab, presence of viral mutation(s) within the areas targeted by this assay, and inadequate number of viral copies(<138 copies/mL). A negative result must be combined with clinical observations, patient history, and epidemiological information. The expected result is Negative.  Fact Sheet for Patients:  EntrepreneurPulse.com.au  Fact Sheet for Healthcare Providers:  IncredibleEmployment.be  This test is no t yet approved or cleared by the [place] FDA and  has been authorized for detection and/or diagnosis of SARS-CoV-2 by FDA under an Emergency Use Authorization (EUA). This EUA will remain  in effect (meaning this test can be used) for the duration of the COVID-19 declaration under Section 564(b)(1) of the Act, 21 U.S.C.section 360bbb-3(b)(1), unless the authorization is terminated  or revoked sooner.       Influenza A by PCR NEGATIVE NEGATIVE Final   Influenza B by PCR NEGATIVE NEGATIVE Final    Comment: (NOTE) The Xpert Xpress SARS-CoV-2/FLU/RSV plus assay is intended as an aid in the diagnosis of influenza from Nasopharyngeal swab specimens and should not be used as a sole basis for treatment. Nasal washings and aspirates are unacceptable for Xpert Xpress SARS-CoV-2/FLU/RSV testing.  Fact Sheet for Patients: EntrepreneurPulse.com.au  Fact Sheet for Healthcare Providers: IncredibleEmployment.be  This test is not yet approved or cleared by the [place] FDA and has been authorized for detection and/or diagnosis of SARS-CoV-2 by FDA under an Emergency Use Authorization (EUA). This EUA will remain in effect (meaning this test can be used) for the  duration of the COVID-19 declaration under Section 564(b)(1) of the Act, 21 U.S.C. section 360bbb-3(b)(1), unless the authorization is terminated or revoked.  Performed at [facility], [address] [place], [place] [address]   Body fluid culture w Gram Stain     Status: None (Preliminary result)   Collection Time: 08/19/22  4:06 PM   Specimen: Synovium; Synovial Fluid  Result Value Ref Range Status   Specimen Description   Final    SYNOVIAL Performed at [facility] [address] [place], [place] [address]    Special Requests RIGHT KNEE  Final   Gram Stain NO WBC SEEN NO ORGANISMS SEEN   Final   Culture   Final    TOO YOUNG TO READ Performed at [facility], [place] [address], [place], [place] [address]    Report Status PENDING  Incomplete  Anaerobic culture w Gram Stain     Status: None (Preliminary result)   Collection Time: 08/19/22  4:07 PM   Specimen: Synovium; Synovial Fluid  Result Value Ref Range Status   Specimen Description   Final    SYNOVIAL Performed at [facility] [address], [place], [place] [address]    Special Requests   Final    RIGHT KNEE Performed at [facility], [place] [address] [place],  [address]    Gram Stain PENDING  Incomplete  Culture PENDING  Incomplete   Report Status PENDING  Incomplete    Pertinent Lab.    Latest Ref Rng & Units 08/20/2022    6:06 AM 08/19/2022    5:23 AM 08/18/2022    6:16 PM  CBC  WBC 4.0 - 10.5 K/uL 5.2  6.0  11.4   Hemoglobin 12.0 - 15.0 g/dL 51.7  61.6  07.3   Hematocrit 36.0 - 46.0 % 43.4  37.0  43.2   Platelets 150 - 400 K/uL 226  161  212       Latest Ref Rng & Units 08/20/2022    5:25 AM 08/19/2022    5:23 AM 08/18/2022    6:16 PM  CMP  Glucose 70 - 99 mg/dL 90  97  710   BUN 6 - 20 mg/dL 10  13  14    Creatinine 0.44 - 1.00 mg/dL  6.26  9.48   Sodium 135 - 145 mmol/L 137  138  136   Potassium 3.5 - 5.1 mmol/L  4.2  3.3  4.2   Chloride 98 - 111 mmol/L 111  111  107   CO2 22 - 32 mmol/L 20  22  22    Calcium 8.9 - 10.3 mg/dL 8.6  8.5  9.6   Total Protein 6.5 - 8.1 g/dL   6.9   Total Bilirubin 0.3 - 1.2 mg/dL   0.7   Alkaline Phos 38 - 126 U/L   243   AST 15 - 41 U/L   21   ALT 0 - 44 U/L   42      Pertinent Imaging today Plain films and CT images have been personally visualized and interpreted; radiology reports have been reviewed. Decision making incorporated into the Impression / Recommendations.  MR KNEE RIGHT WO CONTRAST  Result Date: 08/19/2022 CLINICAL DATA:  Septic arthritis suspected. EXAM: MRI OF THE RIGHT KNEE WITHOUT CONTRAST TECHNIQUE: Multiplanar, multisequence MR imaging of the right was performed. No intravenous contrast was administered. COMPARISON:  Radiographs dated August 18, 2022 FINDINGS: MENISCI Medial: Intact. Lateral: Intact. LIGAMENTS Cruciates: ACL and PCL are intact. Collaterals: Medial collateral ligament is intact. Lateral collateral ligament complex is intact. CARTILAGE Patellofemoral: Deep fissuring at the patellar apex without evidence of full-thickness defect. Medial:  No chondral defect. Lateral:  No chondral defect. JOINT: Trace joint effusion. Normal Hoffa's fat-pad. No plical thickening. POPLITEAL FOSSA: Popliteus tendon is intact. No Baker's cyst. EXTENSOR MECHANISM: Intact quadriceps tendon. Patellar tendinopathy without tear. Intact lateral patellar retinaculum. Intact medial patellar retinaculum. Intact MPFL. BONES: No aggressive osseous lesion. Small enchondroma in the medial femoral condyle. No fracture or dislocation. Other: Skin thickening and subcutaneous soft tissue edema about anterior aspect of the distal thigh and knee. No drainable fluid collection or hematoma. Muscles are normal. IMPRESSION: 1. Marked skin thickening and subcutaneous soft tissue edema about the anterior aspect of the distal thigh and knee. No drainable fluid collection or hematoma. 2.   No MR evidence of septic arthritis. 3. Mild patellar tendinopathy, tendons and ligaments are otherwise intact. Articular cartilage is preserved. No evidence of meniscal injury. 4.  Trace knee joint effusion. Electronically Signed   By: 08/21/2022 D.O.   On: 08/19/2022 17:35     I spent 51 minutes for this patient encounter including review of prior medical records, coordination of care with primary/other specialist with greater than 50% of time being face to face/counseling and discussing diagnostics/treatment plan with the patient/family.  Electronically signed by:   Larose Hires,  MD Infectious Disease Physician [facility] Pager: [phone]

## 2022-08-20 NOTE — Progress Notes (Signed)
Subjective:     Patient reports pain as mild.  She feels the right knee is greatly improved since yesterday. She has remained stable on RNF.   Objective:   VITALS:  Temp:  [98.2 F (36.8 C)-98.5 F (36.9 C)] 98.2 F (36.8 C) (09/22 2001) Pulse Rate:  [73-79] 77 (09/22 2001) Resp:  [18-20] 20 (09/22 2001) BP: (129-145)/(75-94) 145/94 (09/22 2001) SpO2:  [99 %] 99 % (09/22 2001)  Physical Exam  Gen: AAOx3, NAD Comfortable at rest   Right Lower Extremity: Skin intact, resolving erythema over anterior knee without any drainage Minimal TTP over anterior knee joint line No pain with small arcs of motion Trace knee effusion Compartments soft compressible HF/KE/KF/ADF/APF/EHL 5/5 SILT throughout DP, PT 2+ to palp CR < 2s     LABS Recent Labs    08/18/22 1816 08/19/22 0523 08/20/22 0606  HGB 14.2 11.7* 13.8  WBC 11.4* 6.0 5.2  PLT 212 161 226   Recent Labs    08/19/22 0523 08/20/22 0525  NA 138 137  K 3.3* 4.2  CL 111 111  CO2 22 20*  BUN 13 10  CREATININE 0.69 0.63  GLUCOSE 97 90   Recent Labs    08/18/22 1848  INR 1.1     Assessment/Plan: Ortho consult for right knee rule out septic arthritis with overlying cellulitis in setting of recent trauma x 6 days. PMH notable for anxiety, depression osteoarthritis, asthma, chronic back pain, discitis/vertebral osteomyelitis, fibromyalgia, GERD, substance abuse, necrotizing fasciitis status post multiple surgeries in her right UE, h/o substance use and hepatitis C   -much improved on today's exam, now nearly resolved symptoms per patient -history, exam and imaging reviewed at length with patient -reviewed synovial fluid analysis & MRI -no organisms/crystals on GS, SFWBC of 513 and SFPMN 64% (not consistent with infection) -MRI shows no fluid collection or signs of septic arthritis -no acute surgical intervention at this time -recommend continued antibiotic therapy for cellulitis and trend clinical exam which has  been improving -appreciate primary/consulting teams and bedside nursing care  Armond Hang 08/20/2022, 9:54 PM

## 2022-08-20 NOTE — Progress Notes (Signed)
PROGRESS NOTE Claire [name]  [id number] DOB: 05-Jan-1986 DOA: 08/18/2022 PCP: Algis Greenhouse, MD   Brief Narrative/Hospital Course: 23 Se[place] female with hypertension asthma GERD obesity anxiety/depression hep C, IV drug use history of osteomyelitis and necrotizing fasciitis presents with right knee pain swelling and redness after she fell 5 days PTA, suffered a cut to her right knee.  Has been having still increasing redness swelling pain. Denied fever.  In the ED-afebrile, labs with mild leukocytosis normal lactic acid UA normal blood culture sent x-ray chest no acute finding, x-ray knee soft tissue edema without osseous abnormality, placed on IV antibiotics and admitted    Subjective: Seen and examined this morning patient reports she feels much better and her right knee is less swollen and less red.   Overnight afebrile Labs this morning with a stable CBC  Assessment and Plan: Principal Problem:   Cellulitis of knee, right Active Problems:   Essential hypertension   Asthma, chronic   GERD   Severe recurrent major depression without psychotic features (HCC)   Cellulitis   GAD (generalized anxiety disorder)   Chronic hepatitis C without hepatic coma (HCC)   Right knee cellulitis Recent right knee injury 5 days PTA History of necrotizing fasciitis osteomyelitis discitis in the past X-ray knee soft tissue swelling, initial leukocytosis has resolved.  Orthopedic was consulted, had MRI right knee-marked skin thickening and subcutaneous soft tissue edema without the anterior aspect of the distal thigh and knee, no drainable fluid collection or hematoma no MR evidence of septic arthritis, mild patellar tendinopathy, tendons and ligaments are otherwise intact.  S/P knee aspiration Gram stain culture pending, fluid analysis with WBC 5.3 neutrophils 64% days, rate antibiotic, no crystal- discussed w/ Ortho less likley septic arthritis, consistent with cellulitis and likely irritated by the  antiseptic she was using.  cont vancomycin.  ID following.  Appreciate input.Continue pain control with oral IV opiates/NSAID minimize opiates.  Asthma: Stable continue Singulair Dulera and bronchodilators Anxiety/depression: Stable, continue on BuSpar Trintellix Seroquel Lamictal Hepatitis C:Continue home Epclusa prescribed by [facility].  Hep A antibody and hep B surface antibody sent per ID GERD on PPI History of IVDA-abstinencex7 months  DVT prophylaxis: Place and maintain sequential compression device Start: 08/19/22 1707 Code Status:   Code Status: Full Code Family Communication: plan of care discussed with patient/family at bedside. Patient status is: Inpatient due to ongoing management of right knee cellulitis infection   Level of care: Med-Surg  Dispo: The patient is from: home            Anticipated disposition: home Mobility Assessment (last 72 hours)     Mobility Assessment     Row Name 08/19/22 0845 08/18/22 2245         Does patient have an order for bedrest or is patient medically unstable No - Continue assessment No - Continue assessment      What is the highest level of mobility based on the progressive mobility assessment? Level 5 (Walks with assist in room/hall) - Balance while stepping forward/back and can walk in room with assist - Complete Level 5 (Walks with assist in room/hall) - Balance while stepping forward/back and can walk in room with assist - Complete                Objective: Vitals last 24 hrs: Vitals:   08/19/22 1412 08/19/22 2015 08/20/22 0612 08/20/22 0906  BP: (!) 143/98 (!) 147/97 129/75   Pulse: 67 83 73   Resp: 20 20  18   Temp: 98 F (36.7 C) 98.4 F (36.9 C) 98.5 F (36.9 C)   TempSrc:  Oral Oral   SpO2: 100% 98% 99% 99%  Weight:      Height:       Weight change:   Physical Examination: General exam: AAox3,older than stated age, weak appearing. HEENT:Oral mucosa moist, Ear/Nose WNL grossly, dentition normal. Respiratory system:  bilaterally diminished, no use of accessory muscle Cardiovascular system: S1 & S2 +, No JVD,. Gastrointestinal system: Abdomen soft,NT,ND,BS+ Nervous System:Alert, awake, moving extremities and grossly nonfocal Extremities: LE ankle edema neg, right knee is less tender less erythematous and less swollen Skin: No rashes,no icterus. MSK: Normal muscle bulk,tone, power  Rehab female history of child  Medications reviewed:  Scheduled Meds:  busPIRone  30 mg Oral TID   lamoTRIgine  50 mg Oral Daily   mometasone-formoterol  2 puff Inhalation BID   montelukast  10 mg Oral QHS   pantoprazole  40 mg Oral Daily   pregabalin  225 mg Oral TID   QUEtiapine  50 mg Oral QHS   sodium chloride flush  3 mL Intravenous Q12H   Sofosbuvir-Velpatasvir  1 tablet Oral Daily   vortioxetine HBr  20 mg Oral QHS  Continuous Infusions:  vancomycin 1,500 mg (08/20/22 0753)    Diet Order             Diet regular Room service appropriate? Yes; Fluid consistency: Thin  Diet effective now                  Unresulted Labs (From admission, onward)     Start     Ordered   08/20/22 4166  Basic metabolic panel  Daily at 5am,   R      08/19/22 0904   08/20/22 0500  CBC  Daily at 5am,   R      08/19/22 0904   08/20/22 0500  Hepatitis A antibody, total  Tomorrow morning,   R        08/19/22 1600   08/20/22 0500  Hepatitis B surface antibody,quantitative  Tomorrow morning,   R        08/19/22 1600   08/18/22 1828  Blood culture (routine x 2)  BLOOD CULTURE X 2,   R     Question Answer Comment  Patient immune status Immunocompromised   Release to patient Immediate      08/18/22 1827          Data Reviewed: I have personally reviewed following labs and imaging studies CBC: Recent Labs  Lab 08/18/22 1816 08/19/22 0523 08/20/22 0606  WBC 11.4* 6.0 5.2  NEUTROABS 9.0*  --   --   HGB 14.2 11.7* 13.8  HCT 43.2 37.0 43.4  MCV 90.8 93.9 95.0  PLT 212 161 063   Basic Metabolic Panel: Recent Labs   Lab 08/18/22 1816 08/19/22 0523 08/20/22 0525  NA 136 138 137  K 4.2 3.3* 4.2  CL 107 111 111  CO2 22 22 20*  GLUCOSE 124* 97 90  BUN 14 13 10   CREATININE 0.73 0.69 0.63  CALCIUM 9.6 8.5* 8.6*   GFR: Estimated Creatinine Clearance: 116 mL/min (by C-G formula based on SCr of 0.63 mg/dL). Liver Function Tests: Recent Labs  Lab 08/18/22 1816  AST 21  ALT 42  ALKPHOS 243*  BILITOT 0.7  PROT 6.9  ALBUMIN 4.1  Coagulation Profile: Recent Labs  Lab 08/18/22 1848  INR 1.1   ntimicrobials: Anti-infectives (From admission,  onward)    Start     Dose/Rate Route Frequency Ordered Stop   08/19/22 1000  Sofosbuvir-Velpatasvir 400-100 MG TABS 1 tablet        1 tablet Oral Daily 08/18/22 2320     08/19/22 0800  cefTRIAXone (ROCEPHIN) 1 g in sodium chloride 0.9 % 100 mL IVPB  Status:  Discontinued        1 g 200 mL/hr over 30 Minutes Intravenous Every 24 hours 08/18/22 2248 08/18/22 2315   08/19/22 0800  vancomycin (VANCOREADY) IVPB 1500 mg/300 mL        1,500 mg 150 mL/hr over 120 Minutes Intravenous Every 12 hours 08/18/22 2334     08/18/22 1945  ceFEPIme (MAXIPIME) 2 g in sodium chloride 0.9 % 100 mL IVPB        2 g 200 mL/hr over 30 Minutes Intravenous  Once 08/18/22 1930 08/18/22 2028   08/18/22 1900  vancomycin (VANCOCIN) IVPB 1000 mg/200 mL premix        1,000 mg 200 mL/hr over 60 Minutes Intravenous  Once 08/18/22 1848 08/18/22 2125   08/18/22 1900  ceFEPIme (MAXIPIME) 1 g in sodium chloride 0.9 % 100 mL IVPB  Status:  Discontinued        1 g 200 mL/hr over 30 Minutes Intravenous  Once 08/18/22 1848 08/18/22 1930      Culture/Microbiology    Component Value Date/Time   SDES  08/19/2022 1607    SYNOVIAL Performed at [facility], [place] [address], [place], [place] [address]    SPECREQUEST  08/19/2022 1607    RIGHT KNEE Performed at [facility], [place] [address] [place], [place] [address]    CULT PENDING 08/19/2022 1607   REPTSTATUS  PENDING 08/19/2022 1607   Radiology Studies: MR KNEE RIGHT WO CONTRAST  Result Date: 08/19/2022 CLINICAL DATA:  Septic arthritis suspected. EXAM: MRI OF THE RIGHT KNEE WITHOUT CONTRAST TECHNIQUE: Multiplanar, multisequence MR imaging of the right was performed. No intravenous contrast was administered. COMPARISON:  Radiographs dated August 18, 2022 FINDINGS: MENISCI Medial: Intact. Lateral: Intact. LIGAMENTS Cruciates: ACL and PCL are intact. Collaterals: Medial collateral ligament is intact. Lateral collateral ligament complex is intact. CARTILAGE Patellofemoral: Deep fissuring at the patellar apex without evidence of full-thickness defect. Medial:  No chondral defect. Lateral:  No chondral defect. JOINT: Trace joint effusion. Normal Hoffa's fat-pad. No plical thickening. POPLITEAL FOSSA: Popliteus tendon is intact. No Baker's cyst. EXTENSOR MECHANISM: Intact quadriceps tendon. Patellar tendinopathy without tear. Intact lateral patellar retinaculum. Intact medial patellar retinaculum. Intact MPFL. BONES: No aggressive osseous lesion. Small enchondroma in the medial femoral condyle. No fracture or dislocation. Other: Skin thickening and subcutaneous soft tissue edema about anterior aspect of the distal thigh and knee. No drainable fluid collection or hematoma. Muscles are normal. IMPRESSION: 1. Marked skin thickening and subcutaneous soft tissue edema about the anterior aspect of the distal thigh and knee. No drainable fluid collection or hematoma. 2.  No MR evidence of septic arthritis. 3. Mild patellar tendinopathy, tendons and ligaments are otherwise intact. Articular cartilage is preserved. No evidence of meniscal injury. 4.  Trace knee joint effusion. Electronically Signed   By: Keane Police D.O.   On: 08/19/2022 17:35   DG Knee Complete 4 Views Right  Result Date: 08/18/2022 CLINICAL DATA:  Questionable sepsis - evaluate for abnormality Patient reports fall and possible infection. EXAM: RIGHT KNEE -  COMPLETE 4+ VIEW COMPARISON:  None Available. FINDINGS: No evidence of fracture, dislocation, or joint  effusion. No evidence of arthropathy or other focal bone abnormality. No erosion or periostitis. Generalized anterior and lateral soft tissue edema. No soft tissue gas. No radiopaque foreign body. IMPRESSION: Soft tissue edema without acute osseous abnormality. No soft tissue gas. Electronically Signed   By: Keith Rake M.D.   On: 08/18/2022 19:38   DG Chest Port 1 View  Result Date: 08/18/2022 CLINICAL DATA:  Questionable sepsis - evaluate for abnormality EXAM: PORTABLE CHEST 1 VIEW COMPARISON:  Radiograph 04/26/2022 FINDINGS: The cardiomediastinal contours are normal. The lungs are clear. Pulmonary vasculature is normal. No consolidation, pleural effusion, or pneumothorax. No acute osseous abnormalities are seen. Multiple overlying monitoring devices. IMPRESSION: No acute chest findings. Electronically Signed   By: Keith Rake M.D.   On: 08/18/2022 19:37     LOS: 1 day   Antonieta Pert, MD [facility]  08/20/2022, 10:53 AM

## 2022-08-20 NOTE — Progress Notes (Signed)
  Transition of Care [facility]) Screening Note   Patient Details  Name: Claire [name] Date of Birth: [birth year]   Transition of Care [facility]) CM/SW Contact:    Vassie Moselle, LCSW Phone Number: 08/20/2022, 10:32 AM    Transition of Care Department [facility]) has reviewed patient and no TOC needs have been identified at this time. We will continue to monitor patient advancement through interdisciplinary progression rounds. If new patient transition needs arise, please place a TOC consult.

## 2022-08-21 DIAGNOSIS — L03115 Cellulitis of right lower limb: Secondary | ICD-10-CM | POA: Diagnosis not present

## 2022-08-21 LAB — CBC
HCT: 40.7 % (ref 36.0–46.0)
Hemoglobin: 12.9 g/dL (ref 12.0–15.0)
MCH: 30.5 pg (ref 26.0–34.0)
MCHC: 31.7 g/dL (ref 30.0–36.0)
MCV: 96.2 fL (ref 80.0–100.0)
Platelets: 267 10*3/uL (ref 150–400)
RBC: 4.23 MIL/uL (ref 3.87–5.11)
RDW: 14.7 % (ref 11.5–15.5)
WBC: 5.3 10*3/uL (ref 4.0–10.5)
nRBC: 0 % (ref 0.0–0.2)

## 2022-08-21 LAB — BASIC METABOLIC PANEL
Anion gap: 6 (ref 5–15)
BUN: 16 mg/dL (ref 6–20)
CO2: 20 mmol/L — ABNORMAL LOW (ref 22–32)
Calcium: 8.5 mg/dL — ABNORMAL LOW (ref 8.9–10.3)
Chloride: 112 mmol/L — ABNORMAL HIGH (ref 98–111)
Creatinine, Ser: 0.84 mg/dL (ref 0.44–1.00)
GFR, Estimated: >60 mL/min (ref >60)
Glucose, Bld: 147 mg/dL — ABNORMAL HIGH (ref 70–99)
Potassium: 4.2 mmol/L (ref 3.5–5.1)
Sodium: 138 mmol/L (ref 135–145)

## 2022-08-21 LAB — HEPATITIS B SURFACE ANTIBODY, QUANTITATIVE: Hep B S AB Quant (Post): >1000 m[IU]/mL (ref >9.9)

## 2022-08-21 MED ORDER — NICOTINE 14 MG/24HR TD PT24
14.0000 mg | MEDICATED_PATCH | Freq: Every day | TRANSDERMAL | Status: DC
  Administered 2022-08-21 – 2022-08-26 (×6): 14 mg via TRANSDERMAL
  Filled 2022-08-21 (×6): qty 1

## 2022-08-21 MED ORDER — CEFAZOLIN SODIUM-DEXTROSE 2-4 GM/100ML-% IV SOLN
2.0000 g | Freq: Three times a day (TID) | INTRAVENOUS | Status: DC
  Administered 2022-08-21 – 2022-08-22 (×3): 2 g via INTRAVENOUS
  Filled 2022-08-21 (×4): qty 100

## 2022-08-21 NOTE — Progress Notes (Signed)
PROGRESS NOTE Claire [name]  INO:[id number] DOB: 09/26/1986 DOA: 08/18/2022 PCP: Algis Greenhouse, MD   Brief Narrative/Hospital Course: 1 yof with hypertension asthma GERD obesity anxiety/depression hep C, IV drug use history of osteomyelitis and necrotizing fasciitis presents with right knee pain swelling and redness after she fell 5 days PTA, suffered a cut to her right knee.  Has been having still increasing redness swelling pain. Denied fever.  In the ED-afebrile, labs with mild leukocytosis normal lactic acid UA normal blood culture sent x-ray chest no acute finding, x-ray knee soft tissue edema without osseous abnormality, placed on IV antibiotics and admitted-orthopedic ID consulted MRI of the knee did not show septic arthritis, knee arthrocentesis done and fluid sent for culture and continued on IV vancomycin.  Culture with rare Staphylococcus aureus  Subjective: Seen examined this am Pain improving Has been ambulating bedroom to restroom Overnight patient afebrile, Labs remains fairly stable with normal WBC count   Assessment and Plan: Principal Problem:   Cellulitis of knee, right Active Problems:   Essential hypertension   Asthma, chronic   GERD   Severe recurrent major depression without psychotic features (HCC)   Cellulitis   GAD (generalized anxiety disorder)   Chronic hepatitis C without hepatic coma (HCC)   Right knee cellulitis Recent right knee injury 5 days PTA History of necrotizing fasciitis osteomyelitis discitis in the past X-ray knee soft tissue swelling, Orthopedic was consulted, s/p MRI right knee-marked skin thickening and subcutaneous soft tissue edema without the anterior aspect of the distal thigh and knee, no drainable fluid collection or hematoma no MR evidence of septic arthritis, mild patellar tendinopathy, tendons and ligaments are otherwise intact.  S/P knee aspiration -Culture with rare Staphylococcus aureus, no crystal- discussed w/ Ortho, ID. ID   managing antibiotics on vancomycin.  Continue pain management.  Asthma: On room air continue singulair Dulera and bronchodilators Anxiety/depression:stable,continue on BuSpar Trintellix Seroquel Lamictal Hepatitis C:Continue home Epclusa prescribed by [facility].Hep A reactive hep B surface antibod > 10000 GERD on PPI History of IVDA-abstinencex7 months  DVT prophylaxis: Place and maintain sequential compression device Start: 08/19/22 1707 Code Status:   Code Status: Full Code Family Communication: plan of care discussed with patient/family at bedside. Patient status is: Inpatient due to ongoing management of right knee cellulitis infection   Level of care: Med-Surg  Dispo: The patient is from: home            Anticipated disposition: home once knee culture finalized and cellulitis resolves Mobility Assessment (last 72 hours)     Mobility Assessment     Row Name 08/19/22 0845 08/18/22 2245         Does patient have an order for bedrest or is patient medically unstable No - Continue assessment No - Continue assessment      What is the highest level of mobility based on the progressive mobility assessment? Level 5 (Walks with assist in room/hall) - Balance while stepping forward/back and can walk in room with assist - Complete Level 5 (Walks with assist in room/hall) - Balance while stepping forward/back and can walk in room with assist - Complete              Objective: Vitals last 24 hrs: Vitals:   08/20/22 1332 08/20/22 2001 08/21/22 0614 08/21/22 0900  BP: 136/88 (!) 145/94 (!) 131/92   Pulse: 79 77 74   Resp: 18 20 18    Temp: 98.4 F (36.9 C) 98.2 F (36.8 C) 97.8 F (36.6 C)  TempSrc: Oral Oral Oral   SpO2: 99% 99% 100% 99%  Weight:      Height:       Weight change:   Physical Examination: General exam: AAOX3, older than stated age, weak appearing. HEENT:Oral mucosa moist, Ear/Nose WNL grossly, dentition normal. Respiratory system: bilaterally diminished, no use of  accessory muscle Cardiovascular system: S1 & S2 +, No JVD,. Gastrointestinal system: Abdomen soft,NT,ND,BS+ Nervous System:Alert, awake, moving extremities and grossly nonfocal Extremities: Rt knee redness improved, still tender Skin: No rashes,no icterus. MSK: Normal muscle bulk,tone, power   Medications reviewed:  Scheduled Meds:  busPIRone  30 mg Oral TID   lamoTRIgine  50 mg Oral Daily   mometasone-formoterol  2 puff Inhalation BID   montelukast  10 mg Oral QHS   pantoprazole  40 mg Oral Daily   pregabalin  225 mg Oral TID   QUEtiapine  50 mg Oral QHS   sodium chloride flush  3 mL Intravenous Q12H   Sofosbuvir-Velpatasvir  1 tablet Oral Daily   vortioxetine HBr  20 mg Oral QHS  Continuous Infusions:   ceFAZolin (ANCEF) IV     vancomycin 1,500 mg (08/21/22 1136)    Diet Order             Diet regular Room service appropriate? Yes; Fluid consistency: Thin  Diet effective now                  Unresulted Labs (From admission, onward)     Start     Ordered   08/20/22 0500  Basic metabolic panel  Daily at 5am,   R      08/19/22 0904   08/20/22 0500  CBC  Daily at 5am,   R      08/19/22 0904          Data Reviewed: I have personally reviewed following labs and imaging studies CBC: Recent Labs  Lab 08/18/22 1816 08/19/22 0523 08/20/22 0606 08/21/22 0605  WBC 11.4* 6.0 5.2 5.3  NEUTROABS 9.0*  --   --   --   HGB 14.2 11.7* 13.8 12.9  HCT 43.2 37.0 43.4 40.7  MCV 90.8 93.9 95.0 96.2  PLT 212 161 226 267   Basic Metabolic Panel: Recent Labs  Lab 08/18/22 1816 08/19/22 0523 08/20/22 0525 08/21/22 0605  NA 136 138 137 138  K 4.2 3.3* 4.2 4.2  CL 107 111 111 112*  CO2 22 22 20* 20*  GLUCOSE 124* 97 90 147*  BUN 14 13 10 16   CREATININE 0.73 0.69 0.63 0.84  CALCIUM 9.6 8.5* 8.6* 8.5*   GFR: Estimated Creatinine Clearance: 110.5 mL/min (by C-G formula based on SCr of 0.84 mg/dL). Liver Function Tests: Recent Labs  Lab 08/18/22 1816  AST 21  ALT  42  ALKPHOS 243*  BILITOT 0.7  PROT 6.9  ALBUMIN 4.1  Coagulation Profile: Recent Labs  Lab 08/18/22 1848  INR 1.1   ntimicrobials: Anti-infectives (From admission, onward)    Start     Dose/Rate Route Frequency Ordered Stop   08/21/22 1400  ceFAZolin (ANCEF) IVPB 2g/100 mL premix        2 g 200 mL/hr over 30 Minutes Intravenous Every 8 hours 08/21/22 1144     08/20/22 2000  Sofosbuvir-Velpatasvir 400-100 MG TABS 1 tablet        1 tablet Oral Daily 08/20/22 1837     08/19/22 1000  Sofosbuvir-Velpatasvir 400-100 MG TABS 1 tablet  Status:  Discontinued  1 tablet Oral Daily 08/18/22 2320 08/20/22 1837   08/19/22 0800  cefTRIAXone (ROCEPHIN) 1 g in sodium chloride 0.9 % 100 mL IVPB  Status:  Discontinued        1 g 200 mL/hr over 30 Minutes Intravenous Every 24 hours 08/18/22 2248 08/18/22 2315   08/19/22 0800  vancomycin (VANCOREADY) IVPB 1500 mg/300 mL        1,500 mg 150 mL/hr over 120 Minutes Intravenous Every 12 hours 08/18/22 2334     08/18/22 1945  ceFEPIme (MAXIPIME) 2 g in sodium chloride 0.9 % 100 mL IVPB        2 g 200 mL/hr over 30 Minutes Intravenous  Once 08/18/22 1930 08/18/22 2028   08/18/22 1900  vancomycin (VANCOCIN) IVPB 1000 mg/200 mL premix        1,000 mg 200 mL/hr over 60 Minutes Intravenous  Once 08/18/22 1848 08/18/22 2125   08/18/22 1900  ceFEPIme (MAXIPIME) 1 g in sodium chloride 0.9 % 100 mL IVPB  Status:  Discontinued        1 g 200 mL/hr over 30 Minutes Intravenous  Once 08/18/22 1848 08/18/22 1930      Culture/Microbiology    Component Value Date/Time   SDES  08/19/2022 1607    SYNOVIAL Performed at [facility], [address], [place], [place] [address]    SPECREQUEST  08/19/2022 1607    RIGHT KNEE Performed at [facility], [address] [place], [place] [address]    CULT PENDING 08/19/2022 1607   REPTSTATUS PENDING 08/19/2022 1607   Radiology Studies: MR KNEE RIGHT WO CONTRAST  Result Date:  08/19/2022 CLINICAL DATA:  Septic arthritis suspected. EXAM: MRI OF THE RIGHT KNEE WITHOUT CONTRAST TECHNIQUE: Multiplanar, multisequence MR imaging of the right was performed. No intravenous contrast was administered. COMPARISON:  Radiographs dated August 18, 2022 FINDINGS: MENISCI Medial: Intact. Lateral: Intact. LIGAMENTS Cruciates: ACL and PCL are intact. Collaterals: Medial collateral ligament is intact. Lateral collateral ligament complex is intact. CARTILAGE Patellofemoral: Deep fissuring at the patellar apex without evidence of full-thickness defect. Medial:  No chondral defect. Lateral:  No chondral defect. JOINT: Trace joint effusion. Normal Hoffa's fat-pad. No plical thickening. POPLITEAL FOSSA: Popliteus tendon is intact. No Baker's cyst. EXTENSOR MECHANISM: Intact quadriceps tendon. Patellar tendinopathy without tear. Intact lateral patellar retinaculum. Intact medial patellar retinaculum. Intact MPFL. BONES: No aggressive osseous lesion. Small enchondroma in the medial femoral condyle. No fracture or dislocation. Other: Skin thickening and subcutaneous soft tissue edema about anterior aspect of the distal thigh and knee. No drainable fluid collection or hematoma. Muscles are normal. IMPRESSION: 1. Marked skin thickening and subcutaneous soft tissue edema about the anterior aspect of the distal thigh and knee. No drainable fluid collection or hematoma. 2.  No MR evidence of septic arthritis. 3. Mild patellar tendinopathy, tendons and ligaments are otherwise intact. Articular cartilage is preserved. No evidence of meniscal injury. 4.  Trace knee joint effusion. Electronically Signed   By: Larose Hires D.O.   On: 08/19/2022 17:35     LOS: 2 days   Lanae Boast, MD [facility]  08/21/2022, 11:49 AM

## 2022-08-21 NOTE — Plan of Care (Signed)
  Problem: Clinical Measurements: Goal: Ability to maintain clinical measurements within normal limits will improve Outcome: Progressing   Problem: Health Behavior/Discharge Planning: Goal: Ability to manage health-related needs will improve Outcome: Progressing   Problem: Skin Integrity: Goal: Risk for impaired skin integrity will decrease Outcome: Progressing   Problem: Safety: Goal: Ability to remain free from injury will improve Outcome: Progressing   Problem: Pain Managment: Goal: General experience of comfort will improve Outcome: Progressing

## 2022-08-22 DIAGNOSIS — M009 Pyogenic arthritis, unspecified: Secondary | ICD-10-CM

## 2022-08-22 DIAGNOSIS — M00061 Staphylococcal arthritis, right knee: Secondary | ICD-10-CM

## 2022-08-22 LAB — CBC
HCT: 45.7 % (ref 36.0–46.0)
Hemoglobin: 14.2 g/dL (ref 12.0–15.0)
MCH: 29.5 pg (ref 26.0–34.0)
MCHC: 31.1 g/dL (ref 30.0–36.0)
MCV: 94.8 fL (ref 80.0–100.0)
Platelets: 303 10*3/uL (ref 150–400)
RBC: 4.82 MIL/uL (ref 3.87–5.11)
RDW: 14.6 % (ref 11.5–15.5)
WBC: 5 10*3/uL (ref 4.0–10.5)
nRBC: 0 % (ref 0.0–0.2)

## 2022-08-22 LAB — BASIC METABOLIC PANEL
Anion gap: 8 (ref 5–15)
BUN: 19 mg/dL (ref 6–20)
CO2: 22 mmol/L (ref 22–32)
Calcium: 9.1 mg/dL (ref 8.9–10.3)
Chloride: 107 mmol/L (ref 98–111)
Creatinine, Ser: 0.79 mg/dL (ref 0.44–1.00)
GFR, Estimated: >60 mL/min (ref >60)
Glucose, Bld: 90 mg/dL (ref 70–99)
Potassium: 4.4 mmol/L (ref 3.5–5.1)
Sodium: 137 mmol/L (ref 135–145)

## 2022-08-22 LAB — BODY FLUID CULTURE W GRAM STAIN: Gram Stain: NONE SEEN

## 2022-08-22 LAB — VANCOMYCIN, PEAK: Vancomycin Pk: 44 ug/mL — ABNORMAL HIGH (ref 30–40)

## 2022-08-22 MED ORDER — LORAZEPAM 2 MG/ML IJ SOLN
0.5000 mg | INTRAMUSCULAR | Status: DC | PRN
  Administered 2022-08-23 – 2022-08-25 (×12): 0.5 mg via INTRAVENOUS
  Filled 2022-08-22 (×13): qty 1

## 2022-08-22 MED ORDER — FERROUS SULFATE 325 (65 FE) MG PO TABS
325.0000 mg | ORAL_TABLET | Freq: Two times a day (BID) | ORAL | Status: DC
  Administered 2022-08-23 – 2022-08-26 (×6): 325 mg via ORAL
  Filled 2022-08-22 (×6): qty 1

## 2022-08-22 MED ORDER — LORAZEPAM 2 MG/ML IJ SOLN
0.2500 mg | Freq: Once | INTRAMUSCULAR | Status: AC | PRN
  Administered 2022-08-22: 0.25 mg via INTRAVENOUS
  Filled 2022-08-22: qty 1

## 2022-08-22 NOTE — Progress Notes (Signed)
Pharmacy Antibiotic Note  Claire [name] is a 36 y.o. female admitted on 08/18/2022 with medical history significant of hypertension, asthma, GERD, obesity, depression, anxiety, hepatitis C, IV drug use, history of osteomyelitis and necrotizing fasciitis presenting with right knee pain swelling and redness. Pharmacy consulted to dose vancomycin for cellulitis.  1st dose given in the ED  Today, patient is afebrile, WBC WNL, Scr stable, and sensitivity of synovial fluid returned as MRSA. Discussed with ID who expects vancomycin to continue over alternative agents.  Plan: Continue Vancomycin 1500mg  IV q12h (AUC 527.4, used Scr 0.8) Vancomycin peak and trough levels after next dose Monitor clinical progress, renal function, repeat vancomycin levels as indicated, LOT   Height: 5\' 10"  (177.8 cm) Weight: 86.2 kg (190 lb) IBW/kg (Calculated) : 68.5  Temp (24hrs), Avg:98.3 F (36.8 C), Min:97.9 F (36.6 C), Max:98.8 F (37.1 C)  Recent Labs  Lab 08/18/22 1816 08/18/22 2255 08/19/22 0523 08/20/22 0525 08/20/22 0606 08/21/22 0605 08/22/22 0538  WBC 11.4*  --  6.0  --  5.2 5.3 5.0  CREATININE 0.73  --  0.69 0.63  --  0.84 0.79  LATICACIDVEN 0.5 0.5  --   --   --   --   --      Estimated Creatinine Clearance: 116 mL/min (by C-G formula based on SCr of 0.79 mg/dL).    Allergies  Allergen Reactions   Gadolinium Derivatives Anaphylaxis, Rash and Other (See Comments)    Can be tolerated if pre-medicated   Morphine And Related Itching and Other (See Comments)    "If given with benadryl, it's fine"    Antimicrobials this admission: 9/20 vanc >> 9/20 cefepime x 1 9/23-9/24 Ancef  Dose adjustments this admission:   Microbiology results: 9/20 BCx: ngtd 9/21 Rt knee snynovial fluid: MRSA  Thank you for allowing pharmacy to be a part of this patient's care.  Royetta Asal, PharmD, BCPS Clinical Pharmacist [place] Please utilize Amion for appropriate phone number to reach  the unit pharmacist ([place]) 08/22/2022 11:40 AM

## 2022-08-22 NOTE — Progress Notes (Signed)
PROGRESS NOTE Claire [name]  [id number] DOB: 20-Jun-1986 DOA: 08/18/2022 PCP: Algis Greenhouse, MD   Brief Narrative/Hospital Course: 48 yof with hypertension asthma GERD obesity anxiety/depression hep C, IV drug use history of osteomyelitis and necrotizing fasciitis presents with right knee pain swelling and redness after she fell 5 days PTA, suffered a cut to her right knee.  Has been having still increasing redness swelling pain. Denied fever.  In the ED-afebrile, labs with mild leukocytosis normal lactic acid UA normal blood culture sent x-ray chest no acute finding, x-ray knee soft tissue edema without osseous abnormality, placed on IV antibiotics and admitted-orthopedic ID consulted MRI of the knee did not show septic arthritis, knee arthrocentesis done and fluid sent for culture and continued on IV vancomycin. Culture with rare Staphylococcus aureus-MRSA-sensitive to tetracycline clindamycin vancomycin.  Subjective: Seen and examined.  Resting comfortably.  Much better  Endorsed panic attack asking for Ativan  Overnight afebrile.  Assessment and Plan: Principal Problem:   Septic arthritis of knee, right (HCC) Active Problems:   Essential hypertension   Asthma, chronic   GERD   Severe recurrent major depression without psychotic features (HCC)   Cellulitis   GAD (generalized anxiety disorder)   Chronic hepatitis C without hepatic coma (HCC)   Cellulitis of knee, right  MRSA right knee septic arthritis Right knee cellulitis: Recent right knee injury 5 days PTA, leading to right knee cellulitis and right knee septic arthritis.X-ray knee soft tissue swelling, Orthopedic was consulted, s/p MRI right knee-marked skin thickening and subcutaneous soft tissue edema without the anterior aspect of the distal thigh and knee, no drainable fluid collection or hematoma no MR evidence of septic arthritis- S/P knee aspiration - MRSA in [name] fluid.  Antibiotics narrowed down to IV  vancomycin.  ID following.  Right knee redness has improved, less painful, mobilizing well.   Asthma: Stable on room air continue singulair Dulera and bronchodilators Anxiety/depression:stable, endorses some panic attack at times requesting Ativan.Cont on BuSpar Trintellix Seroquel Lamictal. Hepatitis C:Continue home Epclusa prescribed by [facility].Hep A reactive hep B surface antibod > 10000 GERD on PPI History of IVDA-abstinencex7 months  DVT prophylaxis: Place and maintain sequential compression device Start: 08/19/22 1707 Code Status:   Code Status: Full Code Family Communication: plan of care discussed with patient/family at bedside. Patient status is: Inpatient due to ongoing management of right knee cellulitis infection   Level of care: Med-Surg  Dispo: The patient is from:home            Anticipated disposition:TBD.   Mobility Assessment (last 72 hours)     Mobility Assessment     Row Name 08/22/22 0800 08/21/22 2134 08/21/22 0915       Does patient have an order for bedrest or is patient medically unstable No - Continue assessment No - Continue assessment No - Continue assessment     What is the highest level of mobility based on the progressive mobility assessment? Level 5 (Walks with assist in room/hall) - Balance while stepping forward/back and can walk in room with assist - Complete Level 5 (Walks with assist in room/hall) - Balance while stepping forward/back and can walk in room with assist - Complete Level 5 (Walks with assist in room/hall) - Balance while stepping forward/back and can walk in room with assist - Complete             Objective: Vitals last 24 hrs: Vitals:   08/21/22 2027 08/22/22 0429 08/22/22 0810 08/22/22 1257  BP: 133/86 119/70  Marland Kitchen)  134/98  Pulse: 83 76  85  Resp: 18 20  (!) 22  Temp: 98.3 F (36.8 C) 97.9 F (36.6 C)  97.7 F (36.5 C)  TempSrc: Oral   Oral  SpO2: 100% 99% 96% 100%  Weight:      Height:       Weight change:   Physical  Examination: General exam: AA OX3, older than stated age, weak appearing. HEENT:Oral mucosa moist, Ear/Nose WNL grossly, dentition normal. Respiratory system: bilaterally diminished, no use of accessory muscle Cardiovascular system: S1 & S2 +, No JVD,. Gastrointestinal system: Abdomen soft,NT,ND,BS+ Nervous System:Alert, awake, moving extremities and grossly nonfocal Extremities: LE ankle edema neg, right knee erythema has improved minimally tender, right Wound is dry and normal draining.  Skin: No rashes,no icterus. MSK: Normal muscle bulk,tone, power   Medications reviewed:  Scheduled Meds:  busPIRone  30 mg Oral TID   lamoTRIgine  50 mg Oral Daily   mometasone-formoterol  2 puff Inhalation BID   montelukast  10 mg Oral QHS   nicotine  14 mg Transdermal Daily   pantoprazole  40 mg Oral Daily   pregabalin  225 mg Oral TID   QUEtiapine  50 mg Oral QHS   sodium chloride flush  3 mL Intravenous Q12H   Sofosbuvir-Velpatasvir  1 tablet Oral Daily   vortioxetine HBr  20 mg Oral QHS  Continuous Infusions:  vancomycin 1,500 mg (08/22/22 0825)    Diet Order             Diet regular Room service appropriate? Yes; Fluid consistency: Thin  Diet effective now                  Unresulted Labs (From admission, onward)     Start     Ordered   08/23/22 0730  Vancomycin, trough  Once-Timed,   TIMED       See Hyperspace for full Linked Orders Report.   08/22/22 1150   08/22/22 2300  Vancomycin, peak  Once-Timed,   TIMED       See Hyperspace for full Linked Orders Report.   08/22/22 1150   08/20/22 [id number]  Basic metabolic panel  Daily at 5am,   R      08/19/22 [id number]          Data Reviewed: I have personally reviewed following labs and imaging studies CBC: Recent Labs  Lab 08/18/22 1816 08/19/22 0523 08/20/22 0606 08/21/22 0605 08/22/22 0538  WBC 11.4* 6.0 5.2 5.3 5.0  NEUTROABS 9.0*  --   --   --   --   HGB 14.2 11.7* 13.8 12.9 14.2  HCT 43.2 37.0 43.4 40.7 45.7  MCV  90.8 93.9 95.0 96.2 94.8  PLT 212 161 226 267 [id number]   Basic Metabolic Panel: Recent Labs  Lab 08/18/22 1816 08/19/22 0523 08/20/22 0525 08/21/22 0605 08/22/22 0538  NA 136 138 137 138 137  K 4.2 3.3* 4.2 4.2 4.4  CL 107 111 111 112* 107  CO2 22 22 20* 20* 22  GLUCOSE 124* 97 90 147* 90  BUN 14 13 10 16 19   CREATININE 0.73 0.69 0.63 0.84 0.79  CALCIUM 9.6 8.5* 8.6* 8.5* 9.1   GFR: Estimated Creatinine Clearance: 116 mL/min (by C-G formula based on SCr of 0.79 mg/dL). Liver Function Tests: Recent Labs  Lab 08/18/22 1816  AST 21  ALT 42  ALKPHOS 243*  BILITOT 0.7  PROT 6.9  ALBUMIN 4.1  Coagulation Profile: Recent Labs  Lab 08/18/22 1848  INR 1.1   ntimicrobials: Anti-infectives (From admission, onward)    Start     Dose/Rate Route Frequency Ordered Stop   08/21/22 1400  ceFAZolin (ANCEF) IVPB 2g/100 mL premix  Status:  Discontinued        2 g 200 mL/hr over 30 Minutes Intravenous Every 8 hours 08/21/22 1144 08/22/22 1126   08/20/22 2000  Sofosbuvir-Velpatasvir 400-100 MG TABS 1 tablet        1 tablet Oral Daily 08/20/22 1837     08/19/22 1000  Sofosbuvir-Velpatasvir 400-100 MG TABS 1 tablet  Status:  Discontinued        1 tablet Oral Daily 08/18/22 2320 08/20/22 1837   08/19/22 0800  cefTRIAXone (ROCEPHIN) 1 g in sodium chloride 0.9 % 100 mL IVPB  Status:  Discontinued        1 g 200 mL/hr over 30 Minutes Intravenous Every 24 hours 08/18/22 2248 08/18/22 2315   08/19/22 0800  vancomycin (VANCOREADY) IVPB 1500 mg/300 mL        1,500 mg 150 mL/hr over 120 Minutes Intravenous Every 12 hours 08/18/22 2334     08/18/22 1945  ceFEPIme (MAXIPIME) 2 g in sodium chloride 0.9 % 100 mL IVPB        2 g 200 mL/hr over 30 Minutes Intravenous  Once 08/18/22 1930 08/18/22 2028   08/18/22 1900  vancomycin (VANCOCIN) IVPB 1000 mg/200 mL premix        1,000 mg 200 mL/hr over 60 Minutes Intravenous  Once 08/18/22 1848 08/18/22 2125   08/18/22 1900  ceFEPIme (MAXIPIME) 1 g in  sodium chloride 0.9 % 100 mL IVPB  Status:  Discontinued        1 g 200 mL/hr over 30 Minutes Intravenous  Once 08/18/22 1848 08/18/22 1930      Culture/Microbiology    Component Value Date/Time   SDES  08/19/2022 1607    SYNOVIAL Performed at [facility], [place] [address], [place], [place] [address]    SPECREQUEST RIGHT KNEE 08/19/2022 1607   CULT  08/19/2022 1607    NO ANAEROBES ISOLATED; CULTURE IN PROGRESS FOR 5 DAYS   REPTSTATUS PENDING 08/19/2022 1607   Radiology Studies: No results found.   LOS: 3 days   Antonieta Pert, MD [facility]  08/22/2022, 2:20 PM

## 2022-08-22 NOTE — Plan of Care (Signed)
  Problem: Education: Goal: Knowledge of General Education information will improve Description: Including pain rating scale, medication(s)/side effects and non-pharmacologic comfort measures Outcome: Progressing   Problem: Clinical Measurements: Goal: Ability to maintain clinical measurements within normal limits will improve Outcome: Progressing   Problem: Activity: Goal: Risk for activity intolerance will decrease Outcome: Progressing   Problem: Pain Managment: Goal: General experience of comfort will improve Outcome: Progressing   Problem: Safety: Goal: Ability to remain free from injury will improve Outcome: Progressing   Problem: Skin Integrity: Goal: Skin integrity will improve Outcome: Progressing

## 2022-08-22 NOTE — Progress Notes (Signed)
ID PROGRESS NOTE  36yo F w MRSA septic arthritis of right knee  - plan for 4 wk with vancomycin; will discontinue cefazolin - ID team to discuss dispo planning tomorrow.  Elzie Rings [facility] [phone]

## 2022-08-23 ENCOUNTER — Encounter (HOSPITAL_COMMUNITY)
Admission: EM | Disposition: A | Discharge status: Home / Self Care | Payer: Self-pay | Source: Home / Self Care | Attending: Internal Medicine

## 2022-08-23 ENCOUNTER — Inpatient Hospital Stay (HOSPITAL_COMMUNITY): Payer: 59 | Admitting: Certified Registered Nurse Anesthetist

## 2022-08-23 ENCOUNTER — Encounter (HOSPITAL_COMMUNITY): Payer: Self-pay | Admitting: Family Medicine

## 2022-08-23 ENCOUNTER — Other Ambulatory Visit: Payer: Self-pay

## 2022-08-23 DIAGNOSIS — M009 Pyogenic arthritis, unspecified: Secondary | ICD-10-CM | POA: Diagnosis not present

## 2022-08-23 DIAGNOSIS — M00061 Staphylococcal arthritis, right knee: Secondary | ICD-10-CM | POA: Diagnosis not present

## 2022-08-23 DIAGNOSIS — I10 Essential (primary) hypertension: Secondary | ICD-10-CM | POA: Diagnosis not present

## 2022-08-23 DIAGNOSIS — F17210 Nicotine dependence, cigarettes, uncomplicated: Secondary | ICD-10-CM

## 2022-08-23 DIAGNOSIS — J45909 Unspecified asthma, uncomplicated: Secondary | ICD-10-CM

## 2022-08-23 DIAGNOSIS — F1990 Other psychoactive substance use, unspecified, uncomplicated: Secondary | ICD-10-CM

## 2022-08-23 DIAGNOSIS — B182 Chronic viral hepatitis C: Secondary | ICD-10-CM | POA: Diagnosis not present

## 2022-08-23 DIAGNOSIS — L03115 Cellulitis of right lower limb: Secondary | ICD-10-CM | POA: Diagnosis not present

## 2022-08-23 DIAGNOSIS — F418 Other specified anxiety disorders: Secondary | ICD-10-CM

## 2022-08-23 DIAGNOSIS — F332 Major depressive disorder, recurrent severe without psychotic features: Secondary | ICD-10-CM | POA: Diagnosis not present

## 2022-08-23 HISTORY — PX: IRRIGATION AND DEBRIDEMENT KNEE: SHX5185

## 2022-08-23 LAB — BASIC METABOLIC PANEL
Anion gap: 7 (ref 5–15)
BUN: 20 mg/dL (ref 6–20)
CO2: 22 mmol/L (ref 22–32)
Calcium: 9.3 mg/dL (ref 8.9–10.3)
Chloride: 108 mmol/L (ref 98–111)
Creatinine, Ser: 0.82 mg/dL (ref 0.44–1.00)
GFR, Estimated: >60 mL/min (ref >60)
Glucose, Bld: 53 mg/dL — ABNORMAL LOW (ref 70–99)
Potassium: 4.4 mmol/L (ref 3.5–5.1)
Sodium: 137 mmol/L (ref 135–145)

## 2022-08-23 LAB — GLUCOSE, CAPILLARY: Glucose-Capillary: 126 mg/dL — ABNORMAL HIGH (ref 70–99)

## 2022-08-23 LAB — VANCOMYCIN, TROUGH
Vancomycin Tr: 39 ug/mL (ref 15–20)
Vancomycin Tr: 22 ug/mL (ref 15–20)

## 2022-08-23 SURGERY — IRRIGATION AND DEBRIDEMENT KNEE
Anesthesia: General | Site: Knee | Laterality: Right

## 2022-08-23 MED ORDER — CEFAZOLIN SODIUM-DEXTROSE 1-4 GM/50ML-% IV SOLN
1.0000 g | Freq: Three times a day (TID) | INTRAVENOUS | Status: AC
  Administered 2022-08-23 – 2022-08-24 (×3): 1 g via INTRAVENOUS
  Filled 2022-08-23 (×3): qty 50

## 2022-08-23 MED ORDER — CHLORHEXIDINE GLUCONATE 0.12 % MT SOLN
15.0000 mL | Freq: Once | OROMUCOSAL | Status: AC
  Administered 2022-08-23: 15 mL via OROMUCOSAL

## 2022-08-23 MED ORDER — SODIUM CHLORIDE 0.9% FLUSH
10.0000 mL | Freq: Two times a day (BID) | INTRAVENOUS | Status: DC
  Administered 2022-08-23 – 2022-08-25 (×3): 10 mL

## 2022-08-23 MED ORDER — FENTANYL CITRATE PF 50 MCG/ML IJ SOSY: 50.0000 ug | PREFILLED_SYRINGE | Freq: Once | INTRAMUSCULAR | Status: DC

## 2022-08-23 MED ORDER — KETOROLAC TROMETHAMINE 15 MG/ML IJ SOLN
15.0000 mg | Freq: Once | INTRAMUSCULAR | Status: AC
  Administered 2022-08-23: 15 mg via INTRAVENOUS
  Filled 2022-08-23: qty 1

## 2022-08-23 MED ORDER — OXYCODONE HCL 5 MG PO TABS: 5.0000 mg | ORAL_TABLET | Freq: Once | ORAL | Status: DC | PRN

## 2022-08-23 MED ORDER — 0.9 % SODIUM CHLORIDE (POUR BTL) OPTIME
TOPICAL | Status: DC | PRN
  Administered 2022-08-23: 500 mL

## 2022-08-23 MED ORDER — SURGIPHOR WOUND IRRIGATION SYSTEM - OPTIME
TOPICAL | Status: DC | PRN
  Administered 2022-08-23: 450 mL via TOPICAL

## 2022-08-23 MED ORDER — CHLORHEXIDINE GLUCONATE 4 % EX LIQD: 60.0000 mL | Freq: Once | CUTANEOUS | Status: DC

## 2022-08-23 MED ORDER — DEXMEDETOMIDINE HCL IN NACL 80 MCG/20ML IV SOLN
INTRAVENOUS | Status: DC | PRN
  Administered 2022-08-23: 8 ug via BUCCAL
  Administered 2022-08-23: 4 ug via BUCCAL

## 2022-08-23 MED ORDER — MIDAZOLAM HCL 2 MG/2ML IJ SOLN
INTRAMUSCULAR | Status: AC
  Filled 2022-08-23: qty 2

## 2022-08-23 MED ORDER — SODIUM CHLORIDE 0.9% FLUSH: 10.0000 mL | INTRAVENOUS | Status: DC | PRN

## 2022-08-23 MED ORDER — LORAZEPAM 2 MG/ML IJ SOLN
0.5000 mg | Freq: Once | INTRAMUSCULAR | Status: AC
  Administered 2022-08-23: 0.5 mg via INTRAVENOUS

## 2022-08-23 MED ORDER — CEFAZOLIN SODIUM-DEXTROSE 2-4 GM/100ML-% IV SOLN
INTRAVENOUS | Status: AC
  Filled 2022-08-23: qty 100

## 2022-08-23 MED ORDER — LIDOCAINE 2% (20 MG/ML) 5 ML SYRINGE
INTRAMUSCULAR | Status: DC | PRN
  Administered 2022-08-23: 60 mg via INTRAVENOUS

## 2022-08-23 MED ORDER — FENTANYL CITRATE PF 50 MCG/ML IJ SOSY
PREFILLED_SYRINGE | INTRAMUSCULAR | Status: AC
  Filled 2022-08-23: qty 1

## 2022-08-23 MED ORDER — POVIDONE-IODINE 10 % EX SWAB
2.0000 | Freq: Once | CUTANEOUS | Status: AC
  Administered 2022-08-23: 2 via TOPICAL

## 2022-08-23 MED ORDER — PROPOFOL 10 MG/ML IV BOLUS
INTRAVENOUS | Status: DC | PRN
  Administered 2022-08-23: 200 mg via INTRAVENOUS

## 2022-08-23 MED ORDER — PROMETHAZINE HCL 25 MG/ML IJ SOLN: 6.2500 mg | INTRAMUSCULAR | Status: DC | PRN

## 2022-08-23 MED ORDER — PHENYLEPHRINE 80 MCG/ML (10ML) SYRINGE FOR IV PUSH (FOR BLOOD PRESSURE SUPPORT)
PREFILLED_SYRINGE | INTRAVENOUS | Status: DC | PRN
  Administered 2022-08-23: 160 ug via INTRAVENOUS
  Administered 2022-08-23: 80 ug via INTRAVENOUS

## 2022-08-23 MED ORDER — DEXAMETHASONE SODIUM PHOSPHATE 10 MG/ML IJ SOLN
INTRAMUSCULAR | Status: AC
  Filled 2022-08-23: qty 1

## 2022-08-23 MED ORDER — ONDANSETRON HCL 4 MG/2ML IJ SOLN
INTRAMUSCULAR | Status: DC | PRN
  Administered 2022-08-23: 4 mg via INTRAVENOUS

## 2022-08-23 MED ORDER — PHENYLEPHRINE 80 MCG/ML (10ML) SYRINGE FOR IV PUSH (FOR BLOOD PRESSURE SUPPORT)
PREFILLED_SYRINGE | INTRAVENOUS | Status: AC
  Filled 2022-08-23: qty 10

## 2022-08-23 MED ORDER — SODIUM CHLORIDE 0.9 % IR SOLN
Status: DC | PRN
  Administered 2022-08-23: 9000 mL

## 2022-08-23 MED ORDER — OXYCODONE HCL 5 MG/5ML PO SOLN: 5.0000 mg | Freq: Once | ORAL | Status: DC | PRN

## 2022-08-23 MED ORDER — VANCOMYCIN HCL 1000 MG IV SOLR
INTRAVENOUS | Status: AC
  Filled 2022-08-23: qty 20

## 2022-08-23 MED ORDER — METHOCARBAMOL 1000 MG/10ML IJ SOLN
500.0000 mg | Freq: Four times a day (QID) | INTRAVENOUS | Status: DC | PRN
  Filled 2022-08-23: qty 5

## 2022-08-23 MED ORDER — OXYCODONE HCL 5 MG PO TABS
5.0000 mg | ORAL_TABLET | ORAL | Status: DC | PRN
  Administered 2022-08-23 – 2022-08-26 (×12): 5 mg via ORAL
  Filled 2022-08-23 (×14): qty 1

## 2022-08-23 MED ORDER — DEXMEDETOMIDINE HCL IN NACL 80 MCG/20ML IV SOLN
INTRAVENOUS | Status: AC
  Filled 2022-08-23: qty 20

## 2022-08-23 MED ORDER — LACTATED RINGERS IV SOLN: INTRAVENOUS | Status: DC

## 2022-08-23 MED ORDER — FENTANYL CITRATE PF 50 MCG/ML IJ SOSY
PREFILLED_SYRINGE | INTRAMUSCULAR | Status: AC
  Filled 2022-08-23: qty 2

## 2022-08-23 MED ORDER — MIDAZOLAM HCL 5 MG/5ML IJ SOLN
INTRAMUSCULAR | Status: DC | PRN
  Administered 2022-08-23: 2 mg via INTRAVENOUS

## 2022-08-23 MED ORDER — LORAZEPAM 2 MG/ML IJ SOLN
INTRAMUSCULAR | Status: AC
  Filled 2022-08-23: qty 1

## 2022-08-23 MED ORDER — LORAZEPAM 2 MG/ML IJ SOLN: 0.5000 mg | Freq: Once | INTRAMUSCULAR | Status: DC

## 2022-08-23 MED ORDER — VANCOMYCIN HCL IN DEXTROSE 1-5 GM/200ML-% IV SOLN
1000.0000 mg | Freq: Two times a day (BID) | INTRAVENOUS | Status: DC
  Administered 2022-08-24 – 2022-08-25 (×3): 1000 mg via INTRAVENOUS
  Filled 2022-08-23 (×5): qty 200

## 2022-08-23 MED ORDER — LIDOCAINE HCL (PF) 2 % IJ SOLN
INTRAMUSCULAR | Status: AC
  Filled 2022-08-23: qty 5

## 2022-08-23 MED ORDER — FENTANYL CITRATE (PF) 100 MCG/2ML IJ SOLN
INTRAMUSCULAR | Status: DC | PRN
  Administered 2022-08-23 (×4): 50 ug via INTRAVENOUS

## 2022-08-23 MED ORDER — VANCOMYCIN HCL 1000 MG IV SOLR
INTRAVENOUS | Status: DC | PRN
  Administered 2022-08-23: 1000 mg via TOPICAL

## 2022-08-23 MED ORDER — DEXAMETHASONE SODIUM PHOSPHATE 10 MG/ML IJ SOLN
INTRAMUSCULAR | Status: DC | PRN
  Administered 2022-08-23: 10 mg via INTRAVENOUS

## 2022-08-23 MED ORDER — FENTANYL CITRATE (PF) 100 MCG/2ML IJ SOLN
INTRAMUSCULAR | Status: AC
  Filled 2022-08-23: qty 2

## 2022-08-23 MED ORDER — FENTANYL CITRATE PF 50 MCG/ML IJ SOSY
25.0000 ug | PREFILLED_SYRINGE | INTRAMUSCULAR | Status: DC | PRN
  Administered 2022-08-23 (×3): 50 ug via INTRAVENOUS

## 2022-08-23 MED ORDER — ACETAMINOPHEN 500 MG PO TABS: 1000.0000 mg | ORAL_TABLET | Freq: Once | ORAL | Status: DC

## 2022-08-23 MED ORDER — CEFAZOLIN SODIUM-DEXTROSE 2-4 GM/100ML-% IV SOLN
2.0000 g | INTRAVENOUS | Status: AC
  Administered 2022-08-23: 2 g via INTRAVENOUS

## 2022-08-23 MED ORDER — ONDANSETRON HCL 4 MG/2ML IJ SOLN
INTRAMUSCULAR | Status: AC
  Filled 2022-08-23: qty 2

## 2022-08-23 SURGICAL SUPPLY — 49 items
ADH SKN CLS APL DERMABOND .7 (GAUZE/BANDAGES/DRESSINGS)
BAG COUNTER SPONGE SURGICOUNT (BAG) ×1 IMPLANT
BAG SPEC THK2 15X12 ZIP CLS (MISCELLANEOUS) ×1
BAG SPNG CNTER NS LX DISP (BAG) ×1
BAG ZIPLOCK 12X15 (MISCELLANEOUS) ×1 IMPLANT
BLADE SAW SGTL 11.0X1.19X90.0M (BLADE) IMPLANT
BNDG ELASTIC 6X5.8 VLCR STR LF (GAUZE/BANDAGES/DRESSINGS) ×1 IMPLANT
CNTNR URN SCR LID CUP LEK RST (MISCELLANEOUS) IMPLANT
CONT SPEC 4OZ STRL OR WHT (MISCELLANEOUS) ×4
COVER SURGICAL LIGHT HANDLE (MISCELLANEOUS) ×1 IMPLANT
CUFF TOURN SGL QUICK 34 (TOURNIQUET CUFF) ×1
CUFF TRNQT CYL 34X4.125X (TOURNIQUET CUFF) ×1 IMPLANT
DERMABOND ADVANCED .7 DNX12 (GAUZE/BANDAGES/DRESSINGS) ×1 IMPLANT
DRAPE INCISE IOBAN 66X45 STRL (DRAPES) ×3 IMPLANT
DRAPE SHEET LG 3/4 BI-LAMINATE (DRAPES) ×1 IMPLANT
DRESSING PEEL AND PLC PRVNA 13 (GAUZE/BANDAGES/DRESSINGS) IMPLANT
DRSG ADAPTIC 3X8 NADH LF (GAUZE/BANDAGES/DRESSINGS) IMPLANT
DRSG AQUACEL AG ADV 3.5X10 (GAUZE/BANDAGES/DRESSINGS) ×1 IMPLANT
DRSG PEEL AND PLACE PREVENA 13 (GAUZE/BANDAGES/DRESSINGS) ×1
DRSG TEGADERM 4X4.75 (GAUZE/BANDAGES/DRESSINGS) IMPLANT
DURAPREP 26ML APPLICATOR (WOUND CARE) ×1 IMPLANT
EVACUATOR 1/8 PVC DRAIN (DRAIN) IMPLANT
GAUZE PAD ABD 8X10 STRL (GAUZE/BANDAGES/DRESSINGS) IMPLANT
GAUZE SPONGE 2X2 8PLY STRL LF (GAUZE/BANDAGES/DRESSINGS) IMPLANT
GAUZE SPONGE 4X4 12PLY STRL (GAUZE/BANDAGES/DRESSINGS) IMPLANT
GLOVE BIO SURGEON STRL SZ7.5 (GLOVE) IMPLANT
GLOVE SURG ENC MOIS LTX SZ7.5 (GLOVE) ×1 IMPLANT
GOWN STRL REUS W/ TWL LRG LVL3 (GOWN DISPOSABLE) ×1 IMPLANT
GOWN STRL REUS W/TWL LRG LVL3 (GOWN DISPOSABLE) ×2
HANDPIECE INTERPULSE COAX TIP (DISPOSABLE) ×1
KIT DRSG PREVENA PLUS 7DAY 125 (MISCELLANEOUS) IMPLANT
MANIFOLD NEPTUNE II (INSTRUMENTS) ×1 IMPLANT
PACK TOTAL KNEE CUSTOM (KITS) ×1 IMPLANT
PADDING CAST COTTON 6X4 STRL (CAST SUPPLIES) ×1 IMPLANT
PROTECTOR NERVE ULNAR (MISCELLANEOUS) ×1 IMPLANT
SET HNDPC FAN SPRY TIP SCT (DISPOSABLE) ×1 IMPLANT
SET PAD KNEE POSITIONER (MISCELLANEOUS) ×1 IMPLANT
SOLUTION IRRIG SURGIPHOR (IV SOLUTION) ×1 IMPLANT
STAPLER VISISTAT 35W (STAPLE) IMPLANT
SUT ETHILON 2 0 PS N (SUTURE) IMPLANT
SUT MNCRL AB 4-0 PS2 18 (SUTURE) ×1 IMPLANT
SUT PDS AB 0 CT1 36 (SUTURE) IMPLANT
SUT PDS AB 2-0 CT2 27 (SUTURE) IMPLANT
SUT STRATAFIX PDS+ 0 24IN (SUTURE) ×1 IMPLANT
SUT VIC AB 1 CT1 36 (SUTURE) ×2 IMPLANT
SUT VIC AB 2-0 CT1 27 (SUTURE) ×1
SUT VIC AB 2-0 CT1 TAPERPNT 27 (SUTURE) ×3 IMPLANT
SWAB COLLECTION DEVICE MRSA (MISCELLANEOUS) IMPLANT
SWAB CULTURE ESWAB REG 1ML (MISCELLANEOUS) IMPLANT

## 2022-08-23 NOTE — Progress Notes (Signed)
Subjective: Day of Surgery Procedure(s) (LRB): IRRIGATION AND DEBRIDEMENT KNEE (Right)  Patient reports pain as mild-moderate.  She feels the right knee has remained painful although improved since admission.  Objective:   VITALS:  Temp:  [97.6 F (36.4 C)-98.7 F (37.1 C)] 97.6 F (36.4 C) (09/25 0830) Pulse Rate:  [81-91] 91 (09/25 1351) Resp:  [17-20] 17 (09/25 0830) BP: (110-140)/(67-92) 138/92 (09/25 1351) SpO2:  [92 %-100 %] 96 % (09/25 1351)  Physical Exam  Gen: AAOx3, NAD Comfortable at rest   Right Lower Extremity: Skin intact, resolving erythema over anterior knee without any drainage Minimal TTP over anterior knee joint line Mild pain with small arcs of motion Trace knee effusion Compartments soft compressible HF/KE/KF/ADF/APF/EHL 5/5 SILT throughout DP, PT 2+ to palp CR < 2s     LABS Recent Labs    08/21/22 0605 08/22/22 0538  HGB 12.9 14.2  WBC 5.3 5.0  PLT 267 303   Recent Labs    08/22/22 0538 08/23/22 1229  NA 137 137  K 4.4 4.4  CL 107 108  CO2 22 22  BUN 19 20  CREATININE 0.79 0.82  GLUCOSE 90 53*   No results for input(s): "LABPT", "INR" in the last 72 hours.    Assessment/Plan: Ortho consult for right knee rule out septic arthritis with overlying cellulitis in setting of recent trauma x 6 days. PMH notable for anxiety, depression osteoarthritis, asthma, chronic back pain, discitis/vertebral osteomyelitis, fibromyalgia, GERD, substance abuse, necrotizing fasciitis status post multiple surgeries in her right UE, h/o substance use and hepatitis C   -plan for right knee open I&D today -pt has been NPO since MN and held lovenox -history, exam and imaging reviewed at length with patient -reviewed latest culture results with ID Dr. Tommy Medal who advises open I&D right knee due to rare MRSA growth and persistence of symptoms on antibiotics -reviewed synovial fluid analysis & MRI -no organisms/crystals on GS, SFWBC of 513 and SFPMN 64%  (initial analysis not consistent with infection however she is now growing MRSA from aspirate) -MRI shows no fluid collection or signs of septic arthritis -recommend continued antibiotic therapy postop per ID recs -appreciate primary/consulting teams and bedside nursing care  Armond Hang 08/23/2022, 2:06 PM

## 2022-08-23 NOTE — Plan of Care (Signed)

## 2022-08-23 NOTE — Anesthesia Preprocedure Evaluation (Addendum)
Anesthesia Evaluation  Patient identified by MRN, date of birth, ID band Patient awake    Reviewed: Allergy & Precautions, NPO status , Patient's Chart, lab work & pertinent test results  History of Anesthesia Complications Negative for: history of anesthetic complications  Airway Mallampati: II  TM Distance: >3 FB Neck ROM: Full    Dental  (+) Upper Dentures, Lower Dentures   Pulmonary asthma , Current Smoker and Patient abstained from smoking.,    Pulmonary exam normal        Cardiovascular hypertension, Normal cardiovascular exam+ dysrhythmias      Neuro/Psych  Headaches, PSYCHIATRIC DISORDERS Anxiety Depression    GI/Hepatic GERD  Medicated and Controlled,(+)     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, Hepatitis -, C  Endo/Other  negative endocrine ROS  Renal/GU negative Renal ROS     Musculoskeletal  (+) Arthritis , Fibromyalgia - Vertebral osteomyelitis    Abdominal   Peds  Hematology negative hematology ROS (+)   Anesthesia Other Findings   Reproductive/Obstetrics                            Anesthesia Physical Anesthesia Plan  ASA: 3  Anesthesia Plan: General   Post-op Pain Management:    Induction: Intravenous  PONV Risk Score and Plan: 2 and Treatment may vary due to age or medical condition, Ondansetron, Dexamethasone and Midazolam  Airway Management Planned: LMA  Additional Equipment: None  Intra-op Plan:   Post-operative Plan: Extubation in OR  Informed Consent: I have reviewed the patients History and Physical, chart, labs and discussed the procedure including the risks, benefits and alternatives for the proposed anesthesia with the patient or authorized representative who has indicated his/her understanding and acceptance.     Dental advisory given  Plan Discussed with: CRNA and Anesthesiologist  Anesthesia Plan Comments:         Anesthesia Quick Evaluation

## 2022-08-23 NOTE — Op Note (Addendum)
08/23/2022  4:29 PM   PATIENT: Claire [name]  36 y.o. female  MRN: [id number]   PRE-OPERATIVE DIAGNOSIS:   Right knee MRSA septic arthritis   POST-OPERATIVE DIAGNOSIS:   Right knee MRSA septic arthritis and prepatellar infectious bursitis   PROCEDURE: Right knee arthrotomy with irrigation and debridement including synovectomy Right prepatellar bursectomy and sinus tract excision   SURGEON:  Armond Hang, MD   ASSISTANT: None   ANESTHESIA: General, regional   EBL: Minimal   TOURNIQUET:    Total Tourniquet Time Documented: Thigh (Right) - 57 minutes Total: Thigh (Right) - 57 minutes    COMPLICATIONS: None apparent   DISPOSITION: Extubated, awake and stable to recovery.   INDICATION FOR PROCEDURE: The patient presented as ortho consult for right knee rule out septic arthritis with overlying cellulitis.  Per patient report, she fell earlier this week (unclear as to date) sustaining an abrasion to the anterior knee. She scrubbed this knee with peroxide and saline. The pain was not improving so she came in to the ER yesterday. 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 She reports difficulty weightbearing on this leg. She does note that the knee is slightly less symptomatic today since admission. She denies systemic symptoms and has been stable on RNF since admission.  The knee was aspirated with SFWBC of 513 and SFPMN of 64% with negative gram stain and crystals. However, on continued cultures, there was rare growth of MRSA from synovial aspirate with persistent knee symptoms despite antibiotics and pain medication. The patient was also evaluated by Infectious Diseases team who agreed on need for open I&D of the right knee for source control.    PMH notable for anxiety, depression osteoarthritis, asthma, chronic back pain, discitis/vertebral osteomyelitis, fibromyalgia,  GERD, substance abuse, necrotizing fasciitis status post multiple surgeries in her right UE, h/o substance use and hepatitis C.   The patient is an independent community ambulator without assistive device..  We discussed the diagnosis, alternative treatment options, risks and benefits of the above surgical intervention, as well as alternative non-operative treatments. All questions/concerns were addressed and the patient/family demonstrated appropriate understanding of the diagnosis, the procedure, the postoperative course, and overall prognosis. The patient wished to proceed with surgical intervention and signed an informed surgical consent as such, in each others presence prior to surgery.   PROCEDURE IN DETAIL: After preoperative consent was obtained and the correct operative site was identified, the patient was brought to the operating room supine on stretcher and transferred onto operating table. General anesthesia was induced. Preoperative antibiotics were administered. Surgical timeout was taken. The patient was then positioned supine with an ipsilateral hip bump. The operative lower extremity was prepped and draped in standard sterile fashion with a tourniquet around the thigh. The extremity was exsanguinated and the tourniquet was inflated to 300 mmHg.  On examination, there was noted to be a partially healed eschar over the anterolateral knee. On manual palpation under anesthesia, frank purulence was expressed thru this wound. This was sent for aerobic and anaerobic cultures. This eschar was noted to communicate as a sinus tract down to the prepatellar bursa. This sinus tract was excised. The prepatellar bursa was also excised completely and sent for pathology and microbiology analysis.   A standard midline anterior knee incision was utilized. The dissection was carried down to the level of the quadriceps tendon and patellar retinaculum. A standard medial parapatellar arthrotomy  was performed.  There was noted to be synovitis without evidence of frank purulence. Synovectomy was performed. Deep tissue samples were sent from the joint synovium for pathology and microbiology analysis.   We then irrigated the knee with several liters of normal saline. Following this, we instilled povidone iodine solution into the surgical site and let this sit briefly. We again irrigated the left knee thoroughly. After this, we applied vancomycin powder to the surgical site.   The surgical sites were thoroughly irrigated. The tourniquet was deflated and hemostasis achieved. The parapatellar arthrotomy and deep layers were closed using PDS and the subcutaneous tissues closed using PDS as well. The skin was closed without tension using 2-0 nylon suture.    The leg was cleaned with saline and sterile Prevena incisional wound vac was applied over the knee incision and the lateral knee wound where the sinus tract was excised. The patient was awakened from anesthesia and transported to the recovery room in stable condition.    FOLLOW UP PLAN: -transfer to PACU, then return to [facility] under hospitalist service -IV antibiotics per ID based on intraoperative cultures -WBAT operative extremity, avoid knee flexion beyond 90 deg until incision fully healed -maintain Prevena incisional wound vac x 7 days postop -VTE ppx: Aspirin 81 mg twice daily or per primary team choice -follow up as outpatient in 7-10 days for wound check -sutures out in 2-3 weeks in outpatient office   RADIOGRAPHS: None utilized for this procedure.   [name]

## 2022-08-23 NOTE — Progress Notes (Signed)
Brief Pharmacy Note Regarding Antimicrobials  Vank peak 39 Vanc trough 22  Based on above levels, adjust vancomycin to 1000 mg q12h for goal AUC. Discussed with RN - this evening's dose already hanging. Will start reduced dose tomorrow morning.  Follow cultures from procedure today.  Tawnya Crook, PharmD, BCPS Clinical Pharmacist 08/23/2022 9:43 PM

## 2022-08-23 NOTE — Progress Notes (Signed)
Pharmacy Antibiotic Note  Claire [name] is a 36 y.o. female admitted on 08/18/2022 with medical history significant of hypertension, asthma, GERD, obesity, depression, anxiety, hepatitis C, IV drug use, history of osteomyelitis and necrotizing fasciitis presenting with right knee pain swelling and redness. Pharmacy consulted to dose vancomycin for cellulitis.  1st dose given in the ED 08/23/2022 Vancomycin peak 9/24 @ 2302 = 44 meq Vancomycin "trough" 9/25 = 39 drawn @ 1229 - pt refused vancomycin trough prior to 8 am dose & level drawn AFTER the dose was hung @ 0852 am - so level NOT a trough but another peak SCr WNL at 0.82 AF Ortho doing I&D of knee now  Plan: Continue Vancomycin 1500mg  IV q12h (AUC 527.4, used Scr 0.8) F/u plans w/ ID  May be able to transition to orals   Height: 5\' 10"  (177.8 cm) Weight: 86.2 kg (190 lb) IBW/kg (Calculated) : 68.5  Temp (24hrs), Avg:98.2 F (36.8 C), Min:97.6 F (36.4 C), Max:98.7 F (37.1 C)  Recent Labs  Lab 08/18/22 1816 08/18/22 2255 08/19/22 0523 08/20/22 0525 08/20/22 0606 08/21/22 0605 08/22/22 0538 08/22/22 2302 08/23/22 1229  WBC 11.4*  --  6.0  --  5.2 5.3 5.0  --   --   CREATININE 0.73  --  0.69 0.63  --  0.84 0.79  --  0.82  LATICACIDVEN 0.5 0.5  --   --   --   --   --   --   --   VANCOTROUGH  --   --   --   --   --   --   --   --  39*  VANCOPEAK  --   --   --   --   --   --   --  44*  --      Estimated Creatinine Clearance: 113.2 mL/min (by C-G formula based on SCr of 0.82 mg/dL).    Allergies  Allergen Reactions   Gadolinium Derivatives Anaphylaxis, Rash and Other (See Comments)    Can be tolerated if pre-medicated   Morphine And Related Itching and Other (See Comments)    "If given with benadryl, it's fine"    Antimicrobials this admission: 9/20 vanc >> 9/20 cefepime x 1 9/23-9/24 Ancef  Dose adjustments this admission:   Microbiology results: 9/20 BCx: ngtd 9/21 Rt knee snynovial fluid: MRSA  Thank  you for allowing pharmacy to be a part of this patient's care.  Eudelia Bunch, Pharm.D 08/23/2022 3:06 PM

## 2022-08-23 NOTE — H&P (Signed)
H&P Update:  -History and Physical Reviewed  -Patient has been re-examined  -No change in the plan of care  -The risks and benefits were presented and reviewed. The risks due to recurrent/new/persistent infection, stiffness, nerve/vessel/tendon injury, nonunion/malunion, wound healing issues, development of arthritis, failure of this surgery, possibility of delayed definitive surgery, need for further surgery, thromboembolic events, anesthesia/medical complications, amputation, death among others were discussed. The patient acknowledged the explanation, agreed to proceed with the plan and a consent was signed.  Claire [name]  

## 2022-08-23 NOTE — Transfer of Care (Signed)
Immediate Anesthesia Transfer of Care Note  Patient: Claire [name]  Procedure(s) Performed: IRRIGATION AND DEBRIDEMENT KNEE (Right: Knee)  Patient Location: PACU  Anesthesia Type:General  Level of Consciousness: drowsy and patient cooperative  Airway & Oxygen Therapy: Patient Spontanous Breathing and Patient connected to face mask oxygen  Post-op Assessment: Report given to RN and Post -op Vital signs reviewed and stable  Post vital signs: Reviewed and stable  Last Vitals:  Vitals Value Taken Time  BP 137/86 08/23/22 1624  Temp    Pulse 81 08/23/22 1628  Resp 14 08/23/22 1628  SpO2 100 % 08/23/22 1628  Vitals shown include unvalidated device data.  Last Pain:  Vitals:   08/23/22 1400  TempSrc:   PainSc: 8       Patients Stated Pain Goal: 4 (20/35/59 7416)  Complications: No notable events documented.

## 2022-08-23 NOTE — Progress Notes (Signed)
Pt  irritable/disruptive, requesting additional pain medication after receiving prn pain meds.  Instructed that prior Rn spoke with on call surgery, and that no additional pain medications would be ordered.  Assessed knee wound vac, site remains reddened, seal intact. Continue to monitior

## 2022-08-23 NOTE — Progress Notes (Signed)
PROGRESS NOTE Claire [name]  ZOX:[id number] DOB: [birth year] DOA: 08/18/2022 PCP: Olive Bass, MD   Brief Narrative/Hospital Course: 54 yof with hypertension asthma GERD obesity anxiety/depression hep C, IV drug use history of osteomyelitis and necrotizing fasciitis presents with right knee pain swelling and redness after she fell 5 days PTA, suffered a cut to her right knee.  Has been having still increasing redness swelling pain. Denied fever.  In the ED-afebrile, labs with mild leukocytosis normal lactic acid UA normal blood culture sent x-ray chest no acute finding, x-ray knee soft tissue edema without osseous abnormality, placed on IV antibiotics and admitted-orthopedic ID consulted MRI of the knee did not show septic arthritis, knee arthrocentesis done and fluid sent for culture and continued on IV vancomycin. Culture with rare Staphylococcus aureus-MRSA-sensitive to tetracycline clindamycin vancomycin.  Subjective: Seen and examined this morning Redness on right leg has improved but does still pain on ambulation on the right knee Overnight patient afebrile Labs stable Vanco trough 44: 9/24  Assessment and Plan: Principal Problem:   Septic arthritis of knee, right (HCC) Active Problems:   Essential hypertension   Asthma, chronic   GERD   Severe recurrent major depression without psychotic features (HCC)   Cellulitis   GAD (generalized anxiety disorder)   Chronic hepatitis C without hepatic coma (HCC)   Cellulitis of knee, right  MRSA right knee septic arthritis Right knee cellulitis: Recent right knee injury 5 days PTA, leading to right knee cellulitis and right knee septic arthritis-although WBC count in the synovial fluid is not impressive,culture showed rare MRSA.  Discussed with ID Dr. Daiva Eves and Dr. Odis Hollingshead this morning.  Overall right knee cellulitis has resolved, able to mobilize and ambulate-ordered pain and tenderness on the right knee, ID discussed with  patient-she feels comfortable proceeding with I&D as recommended by ID. Continuing vancomycin, continue plan of care as per ID/orthopedics team.   Asthma: Respiratory status stable.  Continue home singulair Dulera and bronchodilators Anxiety/depression:stable, endorses some panic attack at times requesting Ativan.on BuSpar Trintellix Seroquel and Lamictal. 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 Patient status is: Inpatient due to ongoing management of right knee cellulitis infection and need for I and D  Level of care: Med-Surg  Dispo: The patient is from:home            Anticipated disposition:TBD.   Mobility Assessment (last 72 hours)     Mobility Assessment     Row Name 08/23/22 0830 08/22/22 1932 08/22/22 0800 08/21/22 2134 08/21/22 0915   Does patient have an order for bedrest or is patient medically unstable No - Continue assessment No - Continue assessment No - Continue assessment No - Continue assessment No - Continue assessment   What is the highest level of mobility based on the progressive mobility assessment? Level 6 (Walks independently in room and hall) - Balance while walking in room without assist - Complete Level 5 (Walks with assist in room/hall) - Balance while stepping forward/back and can walk in room with assist - Complete Level 5 (Walks with assist in room/hall) - Balance while stepping forward/back and can walk in room with assist - Complete Level 5 (Walks with assist in room/hall) - Balance while stepping forward/back and can walk  in room with assist - Complete Level 5 (Walks with assist in room/hall) - Balance while stepping forward/back and can walk in room with  assist - Complete           Objective: Vitals last 24 hrs: Vitals:   08/22/22 2041 08/23/22 0515 08/23/22 0830 08/23/22 0921  BP: (!) 140/86 110/67 137/78   Pulse: 90 81 81   Resp: 20 20 17    Temp: 98.7 F (37.1 C) 98.4 F (36.9 C) 97.6 F (36.4 C)   TempSrc: Oral Oral Axillary   SpO2: 100% 99% 96% 92%  Weight:      Height:       Weight change:   Physical Examination: General exam: Aa0x3, pleasant HEENT:Oral mucosa moist, Ear/Nose WNL grossly, dentition normal. Respiratory system: bilaterally diminished, no use of accessory muscle Cardiovascular system: S1 & S2 +, No JVD,. Gastrointestinal system: Abdomen soft,NT,ND,BS+ Nervous System:Alert, awake, moving extremities and grossly nonfocal Extremities: LE ankle edema neg, rt knee tender to touch but much better overall Skin: No rashes,no icterus. MSK: Normal muscle bulk,tone, power   Medications reviewed:  Scheduled Meds:  busPIRone  30 mg Oral TID   ferrous sulfate  325 mg Oral BID WC   lamoTRIgine  50 mg Oral Daily   mometasone-formoterol  2 puff Inhalation BID   montelukast  10 mg Oral QHS   nicotine  14 mg Transdermal Daily   pantoprazole  40 mg Oral Daily   pregabalin  225 mg Oral TID   QUEtiapine  50 mg Oral QHS   sodium chloride flush  3 mL Intravenous Q12H   Sofosbuvir-Velpatasvir  1 tablet Oral Daily   vortioxetine HBr  20 mg Oral QHS  Continuous Infusions:  vancomycin 150 mL/hr at 08/23/22 1034    Diet Order             Diet NPO time specified  Diet effective now                  Unresulted Labs (From admission, onward)     Start     Ordered   08/23/22 0730  Vancomycin, trough  Once-Timed,   TIMED       See Hyperspace for full Linked Orders Report.   08/22/22 1150   08/20/22 0500  Basic metabolic panel  Daily at 5am,   R      08/19/22 08/21/22          Data Reviewed: I have personally reviewed following labs and imaging studies CBC: Recent Labs  Lab 08/18/22 1816 08/19/22 0523  08/20/22 0606 08/21/22 0605 08/22/22 0538  WBC 11.4* 6.0 5.2 5.3 5.0  NEUTROABS 9.0*  --   --   --   --   HGB 14.2 11.7* 13.8 12.9 14.2  HCT 43.2 37.0 43.4 40.7 45.7  MCV 90.8 93.9 95.0 96.2 94.8  PLT 212 161 226 267 303   Basic Metabolic Panel: Recent Labs  Lab 08/18/22 1816 08/19/22 0523 08/20/22 0525 08/21/22 0605 08/22/22 0538  NA 136 138 137 138 137  K 4.2 3.3* 4.2 4.2 4.4  CL 107 111 111 112* 107  CO2 22 22 20* 20* 22  GLUCOSE 124* 97 90 147* 90  BUN 14 13 10 16 19   CREATININE 0.73 0.69 0.63 0.84 0.79  CALCIUM 9.6 8.5* 8.6* 8.5* 9.1   GFR: Estimated Creatinine Clearance: 116 mL/min (by C-G formula based on SCr of 0.79 mg/dL). Liver Function Tests: Recent Labs  Lab 08/18/22 1816  AST 21  ALT 42  ALKPHOS 243*  BILITOT 0.7  PROT 6.9  ALBUMIN 4.1  Coagulation Profile: Recent Labs  Lab 08/18/22 1848  INR 1.1   ntimicrobials: Anti-infectives (From admission, onward)    Start     Dose/Rate Route Frequency Ordered Stop   08/21/22 1400  ceFAZolin (ANCEF) IVPB 2g/100 mL premix  Status:  Discontinued        2 g 200 mL/hr over 30 Minutes Intravenous Every 8 hours 08/21/22 1144 08/22/22 1126   08/20/22 2000  Sofosbuvir-Velpatasvir 400-100 MG TABS 1 tablet        1 tablet Oral Daily 08/20/22 1837     08/19/22 1000  Sofosbuvir-Velpatasvir 400-100 MG TABS 1 tablet  Status:  Discontinued        1 tablet Oral Daily 08/18/22 2320 08/20/22 1837   08/19/22 0800  cefTRIAXone (ROCEPHIN) 1 g in sodium chloride 0.9 % 100 mL IVPB  Status:  Discontinued        1 g 200 mL/hr over 30 Minutes Intravenous Every 24 hours 08/18/22 2248 08/18/22 2315   08/19/22 0800  vancomycin (VANCOREADY) IVPB 1500 mg/300 mL        1,500 mg 150 mL/hr over 120 Minutes Intravenous Every 12 hours 08/18/22 2334     08/18/22 1945  ceFEPIme (MAXIPIME) 2 g in sodium chloride 0.9 % 100 mL IVPB        2 g 200 mL/hr over 30 Minutes Intravenous  Once 08/18/22 1930 08/18/22 2028   08/18/22 1900   vancomycin (VANCOCIN) IVPB 1000 mg/200 mL premix        1,000 mg 200 mL/hr over 60 Minutes Intravenous  Once 08/18/22 1848 08/18/22 2125   08/18/22 1900  ceFEPIme (MAXIPIME) 1 g in sodium chloride 0.9 % 100 mL IVPB  Status:  Discontinued        1 g 200 mL/hr over 30 Minutes Intravenous  Once 08/18/22 1848 08/18/22 1930      Culture/Microbiology    Component Value Date/Time   SDES  08/19/2022 1607    SYNOVIAL Performed at [facility], [place] [address], [place], [place] [address]    SPECREQUEST RIGHT KNEE 08/19/2022 1607   CULT  08/19/2022 1607    NO ANAEROBES ISOLATED; CULTURE IN PROGRESS FOR 5 DAYS   REPTSTATUS PENDING 08/19/2022 1607   Radiology Studies: No results found.   LOS: 4 days   Antonieta Pert, MD [facility]  08/23/2022, 11:08 AM

## 2022-08-23 NOTE — Anesthesia Procedure Notes (Signed)
Procedure Name: LMA Insertion Date/Time: 08/23/2022 2:54 PM  Performed by: Montel Clock, CRNAPre-anesthesia Checklist: Patient identified, Emergency Drugs available, Suction available, Patient being monitored and Timeout performed Patient Re-evaluated:Patient Re-evaluated prior to induction Oxygen Delivery Method: Circle system utilized Preoxygenation: Pre-oxygenation with 100% oxygen Induction Type: IV induction LMA: LMA with gastric port inserted LMA Size: 4.0 Number of attempts: 1 Dental Injury: Teeth and Oropharynx as per pre-operative assessment

## 2022-08-23 NOTE — Progress Notes (Signed)
Pt came up from OR screaming with 10/10 pain. Hospitalist was paged, ativan was recommended to be given nurse was advised to hold IV dilaudid. Nurse gave patient IV ativan and patient became agitated and stated that "no one was listening and ativan wouldn't help her pain. Bedside nurse advised to call [facility] Orth[facility]. Bedside nurse spoke with pt and Anastasia Pall, PA and gave bedside nurse a verbal order for 5mg  immediate release oxycodone and pt refused. Anastasia Pall called again and gave bedside nurse permission to use the 0.5mg  IV dilaudid. Pt educated on the risks of using narcotics and limitations within scope of practice. IV dilaudid given.

## 2022-08-23 NOTE — Progress Notes (Signed)
Dear Doctor: This patient has been identified as a candidate for PICC for the following reason (s): poor veins/poor circulatory system (CHF, COPD, emphysema, diabetes, steroid use, IV drug abuse, etc.) If you agree, please write an order for the indicated device.   Thank you for supporting the early vascular access assessment program. 

## 2022-08-23 NOTE — Progress Notes (Signed)
Subjective:  Continues to complain of pain deep in her knee and has still limited range of motion  Antibiotics:  Anti-infectives (From admission, onward)    Start     Dose/Rate Route Frequency Ordered Stop   08/24/22 0600  ceFAZolin (ANCEF) IVPB 2g/100 mL premix        2 g 200 mL/hr over 30 Minutes Intravenous On call to O.R. 08/23/22 1356 08/23/22 1510   08/23/22 1338  ceFAZolin (ANCEF) 2-4 GM/100ML-% IVPB       Note to Pharmacy: Samuella Cota O: cabinet override      08/23/22 1338 08/23/22 1501   08/21/22 1400  ceFAZolin (ANCEF) IVPB 2g/100 mL premix  Status:  Discontinued        2 g 200 mL/hr over 30 Minutes Intravenous Every 8 hours 08/21/22 1144 08/22/22 1126   08/20/22 2000  [MAR Hold]  Sofosbuvir-Velpatasvir 400-100 MG TABS 1 tablet        (MAR Hold since Mon 08/23/2022 at 1348.Hold Reason: Transfer to a Procedural area)   1 tablet Oral Daily 08/20/22 1837     08/19/22 1000  Sofosbuvir-Velpatasvir 400-100 MG TABS 1 tablet  Status:  Discontinued        1 tablet Oral Daily 08/18/22 2320 08/20/22 1837   08/19/22 0800  cefTRIAXone (ROCEPHIN) 1 g in sodium chloride 0.9 % 100 mL IVPB  Status:  Discontinued        1 g 200 mL/hr over 30 Minutes Intravenous Every 24 hours 08/18/22 2248 08/18/22 2315   08/19/22 0800  [MAR Hold]  vancomycin (VANCOREADY) IVPB 1500 mg/300 mL        (MAR Hold since Mon 08/23/2022 at 1348.Hold Reason: Transfer to a Procedural area)   1,500 mg 150 mL/hr over 120 Minutes Intravenous Every 12 hours 08/18/22 2334     08/18/22 1945  ceFEPIme (MAXIPIME) 2 g in sodium chloride 0.9 % 100 mL IVPB        2 g 200 mL/hr over 30 Minutes Intravenous  Once 08/18/22 1930 08/18/22 2028   08/18/22 1900  vancomycin (VANCOCIN) IVPB 1000 mg/200 mL premix        1,000 mg 200 mL/hr over 60 Minutes Intravenous  Once 08/18/22 1848 08/18/22 2125   08/18/22 1900  ceFEPIme (MAXIPIME) 1 g in sodium chloride 0.9 % 100 mL IVPB  Status:  Discontinued        1 g 200 mL/hr  over 30 Minutes Intravenous  Once 08/18/22 1848 08/18/22 1930       Medications: Scheduled Meds:  acetaminophen  1,000 mg Oral Once   [MAR Hold] busPIRone  30 mg Oral TID   chlorhexidine  60 mL Topical Once   fentaNYL (SUBLIMAZE) injection  50 mcg Intravenous Once   [MAR Hold] ferrous sulfate  325 mg Oral BID WC   [MAR Hold] lamoTRIgine  50 mg Oral Daily   [MAR Hold] mometasone-formoterol  2 puff Inhalation BID   [MAR Hold] montelukast  10 mg Oral QHS   [MAR Hold] nicotine  14 mg Transdermal Daily   [MAR Hold] pantoprazole  40 mg Oral Daily   [MAR Hold] pregabalin  225 mg Oral TID   [MAR Hold] QUEtiapine  50 mg Oral QHS   [MAR Hold] sodium chloride flush  3 mL Intravenous Q12H   [MAR Hold] Sofosbuvir-Velpatasvir  1 tablet Oral Daily   [MAR Hold] vortioxetine HBr  20 mg Oral QHS   Continuous Infusions:  lactated ringers 100 mL/hr at 08/23/22  1446   [MAR Hold] vancomycin 150 mL/hr at 08/23/22 1034   PRN Meds:.[MAR Hold] acetaminophen **OR** [MAR Hold] acetaminophen, [MAR Hold] albuterol, [MAR Hold] diphenhydrAMINE, [MAR Hold] HYDROcodone-acetaminophen, [MAR Hold] hydrocortisone cream, [MAR Hold]  HYDROmorphone (DILAUDID) injection, [MAR Hold] loratadine, [MAR Hold] LORazepam, [MAR Hold] ondansetron (ZOFRAN) IV, [MAR Hold] polyethylene glycol, [MAR Hold] tiZANidine    Objective: Weight change:   Intake/Output Summary (Last 24 hours) at 08/23/2022 1552 Last data filed at 08/23/2022 1455 Gross per 24 hour  Intake 1186.37 ml  Output --  Net 1186.37 ml   Blood pressure (!) 138/92, pulse 91, temperature 97.6 F (36.4 C), temperature source Axillary, resp. rate 17, height 5\' 10"  (1.778 m), weight 86.2 kg, SpO2 96 %. Temp:  [97.6 F (36.4 C)-98.7 F (37.1 C)] 97.6 F (36.4 C) (09/25 0830) Pulse Rate:  [81-91] 91 (09/25 1351) Resp:  [17-20] 17 (09/25 0830) BP: (110-140)/(67-92) 138/92 (09/25 1351) SpO2:  [92 %-100 %] 96 % (09/25 1351)  Physical Exam: Physical  Exam Constitutional:      General: She is not in acute distress.    Appearance: She is well-developed. She is not diaphoretic.  HENT:     Head: Normocephalic and atraumatic.     Right Ear: External ear normal.     Left Ear: External ear normal.     Mouth/Throat:     Pharynx: No oropharyngeal exudate.  Eyes:     General: No scleral icterus.    Conjunctiva/sclera: Conjunctivae normal.     Pupils: Pupils are equal, round, and reactive to light.  Cardiovascular:     Rate and Rhythm: Normal rate and regular rhythm.     Heart sounds: Normal heart sounds. No murmur heard.    No friction rub. No gallop.  Pulmonary:     Effort: Pulmonary effort is normal. No respiratory distress.     Breath sounds: Normal breath sounds. No stridor. No wheezing, rhonchi or rales.  Abdominal:     General: Bowel sounds are normal. There is no distension.     Palpations: Abdomen is soft.     Tenderness: There is no abdominal tenderness. There is no rebound.  Musculoskeletal:        General: No tenderness.     Right knee: Swelling present. Decreased range of motion.  Lymphadenopathy:     Cervical: No cervical adenopathy.  Skin:    General: Skin is warm and dry.     Coloration: Skin is not pale.     Findings: No erythema or rash.  Neurological:     General: No focal deficit present.     Mental Status: She is alert and oriented to person, place, and time.     Motor: No abnormal muscle tone.     Coordination: Coordination normal.  Psychiatric:        Mood and Affect: Mood normal.        Behavior: Behavior normal.        Thought Content: Thought content normal.        Judgment: Judgment normal.      CBC:    BMET Recent Labs    08/22/22 0538 08/23/22 1229  NA 137 137  K 4.4 4.4  CL 107 108  CO2 22 22  GLUCOSE 90 53*  BUN 19 20  CREATININE 0.79 0.82  CALCIUM 9.1 9.3     Liver Panel  No results for input(s): "PROT", "ALBUMIN", "AST", "ALT", "ALKPHOS", "BILITOT", "BILIDIR", "IBILI" in  the last 72 hours.  Sedimentation Rate No results for input(s): "ESRSEDRATE" in the last 72 hours. C-Reactive Protein No results for input(s): "CRP" in the last 72 hours.  Micro Results: Recent Results (from the past 720 hour(s))  Blood culture (routine x 2)     Status: None (Preliminary result)   Collection Time: 08/18/22  6:21 PM   Specimen: BLOOD  Result Value Ref Range Status   Specimen Description   Final    BLOOD BLOOD RIGHT HAND Performed at [facility], [address] [place], [place] [address]    Special Requests   Final    Blood Culture adequate volume BOTTLES DRAWN AEROBIC AND ANAEROBIC Performed at [facility], [address], [place], [place] [address]    Culture   Final    NO GROWTH 3 DAYS Performed at [facility], [address] [place], [place] [address]    Report Status PENDING  Incomplete  Blood culture (routine x 2)     Status: None (Preliminary result)   Collection Time: 08/18/22  7:43 PM   Specimen: BLOOD  Result Value Ref Range Status   Specimen Description   Final    BLOOD RIGHT ANTECUBITAL Performed at [facility], [address] [place], [place] [address]    Special Requests   Final    Blood Culture adequate volume BOTTLES DRAWN AEROBIC AND ANAEROBIC Performed at [facility], [address] [place], [place] [address]    Culture   Final    NO GROWTH 3 DAYS Performed at [facility], [address], [place], [place] [address]    Report Status PENDING  Incomplete  Resp Panel by RT-PCR (Flu A&B, Covid) Anterior Nasal Swab     Status: None   Collection Time: 08/18/22  7:50 PM   Specimen: Anterior Nasal Swab  Result Value Ref Range Status   SARS Coronavirus 2 by RT PCR NEGATIVE NEGATIVE Final    Comment: (NOTE) SARS-CoV-2 target nucleic acids are NOT DETECTED.  The SARS-CoV-2 RNA is generally detectable in upper respiratory specimens during  the acute phase of infection. The lowest concentration of SARS-CoV-2 viral copies this assay can detect is 138 copies/mL. A negative result does not preclude SARS-Cov-2 infection and should not be used as the sole basis for treatment or other patient management decisions. A negative result may occur with  improper specimen collection/handling, submission of specimen other than nasopharyngeal swab, presence of viral mutation(s) within the areas targeted by this assay, and inadequate number of viral copies(<138 copies/mL). A negative result must be combined with clinical observations, patient history, and epidemiological information. The expected result is Negative.  Fact Sheet for Patients:  BloggerCourse.com  Fact Sheet for Healthcare Providers:  SeriousBroker.it  This test is no t yet approved or cleared by the [place] FDA and  has been authorized for detection and/or diagnosis of SARS-CoV-2 by FDA under an Emergency Use Authorization (EUA). This EUA will remain  in effect (meaning this test can be used) for the duration of the COVID-19 declaration under Section 564(b)(1) of the Act, 21 U.S.C.section 360bbb-3(b)(1), unless the authorization is terminated  or revoked sooner.       Influenza A by PCR NEGATIVE NEGATIVE Final   Influenza B by PCR NEGATIVE NEGATIVE Final    Comment: (NOTE) The Xpert Xpress SARS-CoV-2/FLU/RSV plus assay is intended as an aid in the diagnosis of influenza from Nasopharyngeal swab specimens and should not be used as a sole basis for treatment. Nasal washings  and aspirates are unacceptable for Xpert Xpress SARS-CoV-2/FLU/RSV testing.  Fact Sheet for Patients: BloggerCourse.comhttps://www.fda.gov/media/152166/download  Fact Sheet for Healthcare Providers: SeriousBroker.ithttps://www.fda.gov/media/152162/download  This test is not yet approved or cleared by the [place] FDA and has been authorized for detection and/or  diagnosis of SARS-CoV-2 by FDA under an Emergency Use Authorization (EUA). This EUA will remain in effect (meaning this test can be used) for the duration of the COVID-19 declaration under Section 564(b)(1) of the Act, 21 U.S.C. section 360bbb-3(b)(1), unless the authorization is terminated or revoked.  Performed at [facility], [address], [place], [place] [address]   Body fluid culture w Gram Stain     Status: None   Collection Time: 08/19/22  4:06 PM   Specimen: Synovium; Synovial Fluid  Result Value Ref Range Status   Specimen Description   Final    SYNOVIAL Performed at [facility], [address] [place], [place] [address]    Special Requests RIGHT KNEE  Final   Gram Stain NO WBC SEEN NO ORGANISMS SEEN   Final   Culture   Final    RARE METHICILLIN RESISTANT STAPHYLOCOCCUS AUREUS CRITICAL RESULT CALLED TO, READ BACK BY AND VERIFIED WITH: RN KAMALA.W AT 1229 ON 08/21/2022 BY T.SAAD. Performed at [facility], [address] [place], [place] [address]    Report Status 08/22/2022 FINAL  Final   Organism ID, Bacteria METHICILLIN RESISTANT STAPHYLOCOCCUS AUREUS  Final      Susceptibility   Methicillin resistant staphylococcus aureus - MIC*    CIPROFLOXACIN >=8 RESISTANT Resistant     ERYTHROMYCIN >=8 RESISTANT Resistant     GENTAMICIN <=0.5 SENSITIVE Sensitive     OXACILLIN >=4 RESISTANT Resistant     TETRACYCLINE <=1 SENSITIVE Sensitive     VANCOMYCIN 1 SENSITIVE Sensitive     TRIMETH/SULFA >=320 RESISTANT Resistant     CLINDAMYCIN <=0.25 SENSITIVE Sensitive     RIFAMPIN <=0.5 SENSITIVE Sensitive     Inducible Clindamycin NEGATIVE Sensitive     * RARE METHICILLIN RESISTANT STAPHYLOCOCCUS AUREUS  Anaerobic culture w Gram Stain     Status: None (Preliminary result)   Collection Time: 08/19/22  4:07 PM   Specimen: Synovium; Synovial Fluid  Result Value Ref Range Status   Specimen Description   Final     SYNOVIAL Performed at [facility], [address] [place], [place] [address]    Special Requests RIGHT KNEE  Final   Gram Stain   Final    NO WBC SEEN NO ORGANISMS SEEN Performed at [facility], [address] [place], [place] [address]    Culture   Final    NO ANAEROBES ISOLATED; CULTURE IN PROGRESS FOR 5 DAYS   Report Status PENDING  Incomplete    Studies/Results: No results found.    Assessment/Plan:  INTERVAL HISTORY: Cultures from aspirate of synovial fluid are positive for MRSA   Principal Problem:   Septic arthritis of knee, right (HCC) Active Problems:   Essential hypertension   Asthma, chronic   GERD   Severe recurrent major depression without psychotic features (HCC)   Cellulitis   GAD (generalized anxiety disorder)   Chronic hepatitis C without hepatic coma (HCC)   Cellulitis of knee, right    Claire CottaHeather D Sisneros is a 36 y.o. female with  discitis/vertebral osteomyelitis,  substance abuse, necrotizing fasciitis status post multiple surgeries in her right UE, h/o substance use and hepatitis C admitted with rt anterior knee cellulitis in the  setting of fall. S/p aspiration by ortho with only 513 WBC, no purulence  Her we will fluid culture came back positive for methicillin resistant Staphylococcus aureus.  Again she has a fairly underwhelming white blood cells in the joint fluid and certainly there  is the possibility that the culture could have been caused by contamination of the synovial fluid from the area of cellulitis.  That being said she does complain of persistent deep pain in the knee and has reduced range of motion.  I think it would be best to err on the side of going forth with I&D of the knee and she agrees with this.  She is now taken the operative room for I&D with orthopedic surgery.  Would continue vancomycin  Hepatitis C continue Epclusa  I spent 54 minutes with the patient including than 50% of the time in  face to face counseling of the patient regarding her possible septic arthritis,  along with review of medical records in preparation for the visit and during the visit and in coordination of her care with Dr. Odis Hollingshead and Dr. Jonathon Bellows.    LOS: 4 days   Paulette Blanch Dam 08/23/2022, 3:52 PM

## 2022-08-24 ENCOUNTER — Encounter (HOSPITAL_COMMUNITY): Payer: Self-pay | Admitting: Orthopaedic Surgery

## 2022-08-24 DIAGNOSIS — F1990 Other psychoactive substance use, unspecified, uncomplicated: Secondary | ICD-10-CM | POA: Diagnosis not present

## 2022-08-24 DIAGNOSIS — L03115 Cellulitis of right lower limb: Secondary | ICD-10-CM | POA: Diagnosis not present

## 2022-08-24 DIAGNOSIS — M00061 Staphylococcal arthritis, right knee: Secondary | ICD-10-CM | POA: Diagnosis not present

## 2022-08-24 LAB — CULTURE, BLOOD (ROUTINE X 2)
Culture: NO GROWTH
Culture: NO GROWTH
Special Requests: ADEQUATE
Special Requests: ADEQUATE

## 2022-08-24 LAB — BASIC METABOLIC PANEL
Anion gap: 6 (ref 5–15)
BUN: 25 mg/dL — ABNORMAL HIGH (ref 6–20)
CO2: 22 mmol/L (ref 22–32)
Calcium: 8.6 mg/dL — ABNORMAL LOW (ref 8.9–10.3)
Chloride: 105 mmol/L (ref 98–111)
Creatinine, Ser: 0.96 mg/dL (ref 0.44–1.00)
GFR, Estimated: >60 mL/min (ref >60)
Glucose, Bld: 76 mg/dL (ref 70–99)
Potassium: 3.8 mmol/L (ref 3.5–5.1)
Sodium: 133 mmol/L — ABNORMAL LOW (ref 135–145)

## 2022-08-24 LAB — SURGICAL PATHOLOGY

## 2022-08-24 LAB — ANAEROBIC CULTURE W GRAM STAIN: Gram Stain: NONE SEEN

## 2022-08-24 MED ORDER — KETOROLAC TROMETHAMINE 15 MG/ML IJ SOLN
15.0000 mg | Freq: Four times a day (QID) | INTRAMUSCULAR | Status: DC | PRN
  Administered 2022-08-24 – 2022-08-25 (×4): 15 mg via INTRAVENOUS
  Filled 2022-08-24 (×4): qty 1

## 2022-08-24 MED ORDER — KETOROLAC TROMETHAMINE 15 MG/ML IJ SOLN
15.0000 mg | Freq: Once | INTRAMUSCULAR | Status: AC
  Administered 2022-08-24: 15 mg via INTRAVENOUS
  Filled 2022-08-24: qty 1

## 2022-08-24 NOTE — Progress Notes (Signed)
PROGRESS NOTE Claire [name]  FTD:[id number] DOB: December 16, 1985 DOA: 08/18/2022 PCP: Olive Bass, MD   Brief Narrative/Hospital Course: 37 yof with hypertension asthma GERD obesity anxiety/depression hep C, IV drug use history of osteomyelitis and necrotizing fasciitis presents with right knee pain swelling and redness after she fell 5 days PTA, suffered a cut to her right knee.  Has been having still increasing redness swelling pain. Denied fever.  In the ED-afebrile, labs with mild leukocytosis normal lactic acid UA normal blood culture sent x-ray chest no acute finding, x-ray knee soft tissue edema without osseous abnormality, placed on IV antibiotics and admitted-orthopedic ID consulted MRI of the knee did not show septic arthritis, knee arthrocentesis done and fluid sent for culture and continued on IV vancomycin. Culture with rare Staphylococcus aureus-MRSA-sensitive to tetracycline clindamycin vancomycin.  ID recommended I&D, and completed I&D by Dr. Odis Hollingshead 9/25  Subjective:  Overnight afebrile, pain issues in the right knee  Patient asking mostly IV Dilaudid  Encouraged to use oral prior to IV, added Toradol   Assessment and Plan: Principal Problem:   Septic arthritis of knee, right (HCC) Active Problems:   Essential hypertension   Asthma, chronic   GERD   Severe recurrent major depression without psychotic features (HCC)   Cellulitis   GAD (generalized anxiety disorder)   IVDU (intravenous drug user)   Chronic hepatitis C without hepatic coma (HCC)   Cellulitis of knee, right  Right knee MRSA Septic arthritis Right knee cellulitis: Recent right knee injury 5 days PTA, leading to right knee cellulitis and right knee septic arthritis-although WBC count in the synovial fluid is not impressive,culture showed rare MRSA> 9/24 status post right knee arthrotomy with irrigation and debridement, right prepatellar bursectomy and sinus tract excision.  On vancomycin pharmacy  managing monitor trough, ID following, continue pain control encourage oral opiate/nonopiate for breakthrough is IV Dilaudid, added Toradol as well.  Asthma: Stable on home singulair Dulera and bronchodilators Anxiety/depression:stable, endorses some panic attack at times requesting Ativan.on BuSpar Trintellix Seroquel and Lamictal. 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 Patient status is: Inpatient due to ongoing management of right knee cellulitis/septic arthritis  Level of care: Med-Surg  Dispo: The patient is from:home            Anticipated disposition:TBD.   Mobility Assessment (last 72 hours)     Mobility Assessment     Row Name 08/24/22 1000 08/23/22 2100 08/23/22 1355 08/23/22 0830 08/22/22 1932   Does patient have an order for bedrest or is patient medically unstable No - Continue assessment No - Continue assessment No - Continue assessment No - Continue assessment No - Continue assessment   What is the highest level of mobility based on the progressive mobility assessment? Level 6 (Walks independently in room and hall) - Balance while walking in room without assist - Complete Level 6 (Walks independently in room and hall) - Balance while walking in room without assist - Complete Level 6 (Walks independently in room and hall) - Balance while walking in room without assist - Complete Level 6 (Walks independently in room and hall) - Balance while walking in room without assist - Complete Level 5 (Walks with assist in room/hall) - Balance while stepping forward/back and  can walk in room with assist - Complete    Row Name 08/22/22 0800 08/21/22 2134         Does patient have an  order for bedrest or is patient medically unstable No - Continue assessment No - Continue assessment      What is the highest level of mobility based on the progressive mobility assessment? Level 5 (Walks with assist in room/hall) - Balance while stepping forward/back and can walk in room with assist - Complete Level 5 (Walks with assist in room/hall) - Balance while stepping forward/back and can walk in room with assist - Complete              Objective: Vitals last 24 hrs: Vitals:   08/23/22 2018 08/23/22 2202 08/24/22 0150 08/24/22 0559  BP: (!) 152/98 133/85 128/80 (!) 106/90  Pulse: 99 (!) 108 (!) 107 96  Resp: 20 18 20 18   Temp: 98.2 F (36.8 C) 98 F (36.7 C) 98.3 F (36.8 C) 97.8 F (36.6 C)  TempSrc: Oral Oral Oral Oral  SpO2: 100%  97% 100%  Weight:      Height:       Weight change:   Physical Examination: General exam: AAox3, older than stated age, weak appearing. HEENT:Oral mucosa moist, Ear/Nose WNL grossly, dentition normal. Respiratory system: bilaterally diminished, no use of accessory muscle Cardiovascular system: S1 & S2 +, No JVD,. Gastrointestinal system: Abdomen soft,NT,ND,BS+ Nervous System:Alert, awake, right knee dressing in place with drain, mildly tender to touch Extremities: LE ankle edema neg, distal peripheral pulses palpable.  Skin: No rashes,no icterus. MSK: Normal muscle bulk,tone, power   Medications reviewed:  Scheduled Meds:  busPIRone  30 mg Oral TID   ferrous sulfate  325 mg Oral BID WC   lamoTRIgine  50 mg Oral Daily   mometasone-formoterol  2 puff Inhalation BID   montelukast  10 mg Oral QHS   nicotine  14 mg Transdermal Daily   pantoprazole  40 mg Oral Daily   pregabalin  225 mg Oral TID   QUEtiapine  50 mg Oral QHS   sodium chloride flush  10-40 mL Intracatheter Q12H   sodium chloride flush  3 mL Intravenous Q12H   Sofosbuvir-Velpatasvir  1 tablet Oral Daily   vortioxetine HBr  20 mg Oral QHS  Continuous Infusions:    ceFAZolin (ANCEF) IV 1 g (08/24/22 08/26/22)   methocarbamol (ROBAXIN) IV     vancomycin 1,000 mg (08/24/22 0946)    Diet Order             Diet regular Room service appropriate? Yes; Fluid consistency: Thin  Diet effective now                  Unresulted Labs (From admission, onward)     Start     Ordered   08/24/22 1147  Basic metabolic panel  ONCE - STAT,   STAT       Question:  Specimen collection method  Answer:  Lab=Lab collect   08/24/22 1146          Data Reviewed: I have personally reviewed following labs and imaging studies CBC: Recent Labs  Lab 08/18/22 1816 08/19/22 0523 08/20/22 0606 08/21/22 0605 08/22/22 0538  WBC 11.4* 6.0 5.2 5.3 5.0  NEUTROABS 9.0*  --   --   --   --   HGB 14.2 11.7* 13.8 12.9 14.2  HCT 43.2 37.0 43.4 40.7 45.7  MCV 90.8 93.9 95.0 96.2 94.8  PLT 212  161 226 267 154   Basic Metabolic Panel: Recent Labs  Lab 08/20/22 0525 08/21/22 0605 08/22/22 0538 08/23/22 1229 08/24/22 1145  NA 137 138 137 137 133*  K 4.2 4.2 4.4 4.4 3.8  CL 111 112* 107 108 105  CO2 20* 20* 22 22 22   GLUCOSE 90 147* 90 53* 76  BUN 10 16 19 20  25*  CREATININE 0.63 0.84 0.79 0.82 0.96  CALCIUM 8.6* 8.5* 9.1 9.3 8.6*   GFR: Estimated Creatinine Clearance: 96.7 mL/min (by C-G formula based on SCr of 0.96 mg/dL). Liver Function Tests: Recent Labs  Lab 08/18/22 1816  AST 21  ALT 42  ALKPHOS 243*  BILITOT 0.7  PROT 6.9  ALBUMIN 4.1  Coagulation Profile: Recent Labs  Lab 08/18/22 1848  INR 1.1   ntimicrobials: Anti-infectives (From admission, onward)    Start     Dose/Rate Route Frequency Ordered Stop   08/24/22 1000  vancomycin (VANCOCIN) IVPB 1000 mg/200 mL premix        1,000 mg 200 mL/hr over 60 Minutes Intravenous Every 12 hours 08/23/22 2139     08/24/22 0600  ceFAZolin (ANCEF) IVPB 2g/100 mL premix        2 g 200 mL/hr over 30 Minutes Intravenous On call to O.R. 08/23/22 1356 08/24/22 0630   08/23/22 2200  ceFAZolin (ANCEF) IVPB 1  g/50 mL premix        1 g 100 mL/hr over 30 Minutes Intravenous Every 8 hours 08/23/22 1647 08/24/22 2159   08/23/22 1600  vancomycin (VANCOCIN) powder  Status:  Discontinued          As needed 08/23/22 1605 08/23/22 1651   08/23/22 1338  ceFAZolin (ANCEF) 2-4 GM/100ML-% IVPB       Note to Pharmacy: Humberto Leep O: cabinet override      08/23/22 1338 08/23/22 1501   08/21/22 1400  ceFAZolin (ANCEF) IVPB 2g/100 mL premix  Status:  Discontinued        2 g 200 mL/hr over 30 Minutes Intravenous Every 8 hours 08/21/22 1144 08/22/22 1126   08/20/22 2000  Sofosbuvir-Velpatasvir 400-100 MG TABS 1 tablet        1 tablet Oral Daily 08/20/22 1837     08/19/22 1000  Sofosbuvir-Velpatasvir 400-100 MG TABS 1 tablet  Status:  Discontinued        1 tablet Oral Daily 08/18/22 2320 08/20/22 1837   08/19/22 0800  cefTRIAXone (ROCEPHIN) 1 g in sodium chloride 0.9 % 100 mL IVPB  Status:  Discontinued        1 g 200 mL/hr over 30 Minutes Intravenous Every 24 hours 08/18/22 2248 08/18/22 2315   08/19/22 0800  vancomycin (VANCOREADY) IVPB 1500 mg/300 mL  Status:  Discontinued        1,500 mg 150 mL/hr over 120 Minutes Intravenous Every 12 hours 08/18/22 2334 08/23/22 2139   08/18/22 1945  ceFEPIme (MAXIPIME) 2 g in sodium chloride 0.9 % 100 mL IVPB        2 g 200 mL/hr over 30 Minutes Intravenous  Once 08/18/22 1930 08/18/22 2028   08/18/22 1900  vancomycin (VANCOCIN) IVPB 1000 mg/200 mL premix        1,000 mg 200 mL/hr over 60 Minutes Intravenous  Once 08/18/22 1848 08/18/22 2125   08/18/22 1900  ceFEPIme (MAXIPIME) 1 g in sodium chloride 0.9 % 100 mL IVPB  Status:  Discontinued        1 g 200 mL/hr over 30 Minutes Intravenous  Once  08/18/22 1848 08/18/22 1930      Culture/Microbiology    Component Value Date/Time   SDES  08/23/2022 1538    TISSUE Performed at [facility], [address] [place], [place] [address]    SPECREQUEST RIGHT KNEE PREPATELLAR TISSUE 08/23/2022 1538    CULT  08/23/2022 1538    NO GROWTH < 12 HOURS Performed at [facility], [address], [place], [place] [address]    REPTSTATUS PENDING 08/23/2022 1538   Radiology Studies: No results found.   LOS: 5 days   Lanae Boast, MD [facility]  08/24/2022, 1:41 PM

## 2022-08-24 NOTE — Progress Notes (Signed)
Subjective: She is complaining of inadequate pain control  Antibiotics:  Anti-infectives (From admission, onward)    Start     Dose/Rate Route Frequency Ordered Stop   08/24/22 1000  vancomycin (VANCOCIN) IVPB 1000 mg/200 mL premix        1,000 mg 200 mL/hr over 60 Minutes Intravenous Every 12 hours 08/23/22 2139     08/24/22 0600  ceFAZolin (ANCEF) IVPB 2g/100 mL premix        2 g 200 mL/hr over 30 Minutes Intravenous On call to O.R. 08/23/22 1356 08/24/22 0630   08/23/22 2200  ceFAZolin (ANCEF) IVPB 1 g/50 mL premix        1 g 100 mL/hr over 30 Minutes Intravenous Every 8 hours 08/23/22 1647 08/24/22 2159   08/23/22 1600  vancomycin (VANCOCIN) powder  Status:  Discontinued          As needed 08/23/22 1605 08/23/22 1651   08/23/22 1338  ceFAZolin (ANCEF) 2-4 GM/100ML-% IVPB       Note to Pharmacy: Claire [name] O: cabinet override      08/23/22 1338 08/23/22 1501   08/21/22 1400  ceFAZolin (ANCEF) IVPB 2g/100 mL premix  Status:  Discontinued        2 g 200 mL/hr over 30 Minutes Intravenous Every 8 hours 08/21/22 1144 08/22/22 1126   08/20/22 2000  Sofosbuvir-Velpatasvir 400-100 MG TABS 1 tablet        1 tablet Oral Daily 08/20/22 1837     08/19/22 1000  Sofosbuvir-Velpatasvir 400-100 MG TABS 1 tablet  Status:  Discontinued        1 tablet Oral Daily 08/18/22 2320 08/20/22 1837   08/19/22 0800  cefTRIAXone (ROCEPHIN) 1 g in sodium chloride 0.9 % 100 mL IVPB  Status:  Discontinued        1 g 200 mL/hr over 30 Minutes Intravenous Every 24 hours 08/18/22 2248 08/18/22 2315   08/19/22 0800  vancomycin (VANCOREADY) IVPB 1500 mg/300 mL  Status:  Discontinued        1,500 mg 150 mL/hr over 120 Minutes Intravenous Every 12 hours 08/18/22 2334 08/23/22 2139   08/18/22 1945  ceFEPIme (MAXIPIME) 2 g in sodium chloride 0.9 % 100 mL IVPB        2 g 200 mL/hr over 30 Minutes Intravenous  Once 08/18/22 1930 08/18/22 2028   08/18/22 1900  vancomycin (VANCOCIN) IVPB 1000 mg/200 mL  premix        1,000 mg 200 mL/hr over 60 Minutes Intravenous  Once 08/18/22 1848 08/18/22 2125   08/18/22 1900  ceFEPIme (MAXIPIME) 1 g in sodium chloride 0.9 % 100 mL IVPB  Status:  Discontinued        1 g 200 mL/hr over 30 Minutes Intravenous  Once 08/18/22 1848 08/18/22 1930       Medications: Scheduled Meds:  busPIRone  30 mg Oral TID   ferrous sulfate  325 mg Oral BID WC   lamoTRIgine  50 mg Oral Daily   mometasone-formoterol  2 puff Inhalation BID   montelukast  10 mg Oral QHS   nicotine  14 mg Transdermal Daily   pantoprazole  40 mg Oral Daily   pregabalin  225 mg Oral TID   QUEtiapine  50 mg Oral QHS   sodium chloride flush  10-40 mL Intracatheter Q12H   sodium chloride flush  3 mL Intravenous Q12H   Sofosbuvir-Velpatasvir  1 tablet Oral Daily   vortioxetine HBr  20 mg  Oral QHS   Continuous Infusions:   ceFAZolin (ANCEF) IV 1 g (08/24/22 7858)   methocarbamol (ROBAXIN) IV     vancomycin 1,000 mg (08/24/22 0946)   PRN Meds:.acetaminophen **OR** acetaminophen, albuterol, diphenhydrAMINE, hydrocortisone cream, HYDROmorphone (DILAUDID) injection, ketorolac, loratadine, LORazepam, methocarbamol (ROBAXIN) IV, ondansetron (ZOFRAN) IV, oxyCODONE, polyethylene glycol, sodium chloride flush    Objective: Weight change:   Intake/Output Summary (Last 24 hours) at 08/24/2022 1421 Last data filed at 08/24/2022 0600 Gross per 24 hour  Intake 750 ml  Output 400 ml  Net 350 ml    Blood pressure 132/84, pulse 97, temperature 99 F (37.2 C), temperature source Oral, resp. rate 19, height 5\' 10"  (1.778 m), weight 86.2 kg, SpO2 98 %. Temp:  [97.8 F (36.6 C)-99 F (37.2 C)] 99 F (37.2 C) (09/26 1418) Pulse Rate:  [78-110] 97 (09/26 1418) Resp:  [10-23] 19 (09/26 1418) BP: (106-152)/(76-98) 132/84 (09/26 1418) SpO2:  [97 %-100 %] 98 % (09/26 1418)  Physical Exam: Physical Exam Constitutional:      General: She is not in acute distress.    Appearance: She is  well-developed. She is not diaphoretic.  HENT:     Head: Normocephalic and atraumatic.     Right Ear: External ear normal.     Left Ear: External ear normal.     Mouth/Throat:     Pharynx: No oropharyngeal exudate.  Eyes:     General: No scleral icterus.    Conjunctiva/sclera: Conjunctivae normal.     Pupils: Pupils are equal, round, and reactive to light.  Cardiovascular:     Rate and Rhythm: Normal rate and regular rhythm.  Pulmonary:     Effort: Pulmonary effort is normal. No respiratory distress.     Breath sounds: No wheezing or rales.  Abdominal:     General: There is no distension.     Palpations: Abdomen is soft.  Musculoskeletal:        General: No tenderness. Normal range of motion.  Lymphadenopathy:     Cervical: No cervical adenopathy.  Skin:    General: Skin is warm and dry.     Coloration: Skin is not pale.     Findings: No erythema or rash.  Neurological:     General: No focal deficit present.     Mental Status: She is alert and oriented to person, place, and time.     Motor: No abnormal muscle tone.     Coordination: Coordination normal.  Psychiatric:        Mood and Affect: Mood is depressed.        Speech: Speech is delayed.        Behavior: Behavior normal.        Thought Content: Thought content normal.        Judgment: Judgment normal.     Comments: sleepy     Knee bandaged  CBC:    BMET Recent Labs    08/23/22 1229 08/24/22 1145  NA 137 133*  K 4.4 3.8  CL 108 105  CO2 22 22  GLUCOSE 53* 76  BUN 20 25*  CREATININE 0.82 0.96  CALCIUM 9.3 8.6*      Liver Panel  No results for input(s): "PROT", "ALBUMIN", "AST", "ALT", "ALKPHOS", "BILITOT", "BILIDIR", "IBILI" in the last 72 hours.     Sedimentation Rate No results for input(s): "ESRSEDRATE" in the last 72 hours. C-Reactive Protein No results for input(s): "CRP" in the last 72 hours.  Micro Results: Recent Results (from the past  720 hour(s))  Blood culture (routine x 2)      Status: None   Collection Time: 08/18/22  6:21 PM   Specimen: BLOOD  Result Value Ref Range Status   Specimen Description   Final    BLOOD BLOOD RIGHT HAND Performed at [facility], [address] [place], [place] [address]    Special Requests   Final    Blood Culture adequate volume BOTTLES DRAWN AEROBIC AND ANAEROBIC Performed at [facility], [address], [place], [place] [address]    Culture   Final    NO GROWTH 5 DAYS Performed at [facility], [address], [place], [place] [address]    Report Status 08/24/2022 FINAL  Final  Blood culture (routine x 2)     Status: None   Collection Time: 08/18/22  7:43 PM   Specimen: BLOOD  Result Value Ref Range Status   Specimen Description   Final    BLOOD RIGHT ANTECUBITAL Performed at [facility], [address], [place], [place] [address]    Special Requests   Final    Blood Culture adequate volume BOTTLES DRAWN AEROBIC AND ANAEROBIC Performed at [facility], [address] [place], [place] [address]    Culture   Final    NO GROWTH 5 DAYS Performed at [facility], [address] [place], [place] [address]    Report Status 08/24/2022 FINAL  Final  Resp Panel by RT-PCR (Flu A&B, Covid) Anterior Nasal Swab     Status: None   Collection Time: 08/18/22  7:50 PM   Specimen: Anterior Nasal Swab  Result Value Ref Range Status   SARS Coronavirus 2 by RT PCR NEGATIVE NEGATIVE Final    Comment: (NOTE) SARS-CoV-2 target nucleic acids are NOT DETECTED.  The SARS-CoV-2 RNA is generally detectable in upper respiratory specimens during the acute phase of infection. The lowest concentration of SARS-CoV-2 viral copies this assay can detect is 138 copies/mL. A negative result does not preclude SARS-Cov-2 infection and should not be used as the sole basis for treatment or other patient management decisions. A negative result may occur with   improper specimen collection/handling, submission of specimen other than nasopharyngeal swab, presence of viral mutation(s) within the areas targeted by this assay, and inadequate number of viral copies(<138 copies/mL). A negative result must be combined with clinical observations, patient history, and epidemiological information. The expected result is Negative.  Fact Sheet for Patients:  BloggerCourse.com  Fact Sheet for Healthcare Providers:  SeriousBroker.it  This test is no t yet approved or cleared by the [place] FDA and  has been authorized for detection and/or diagnosis of SARS-CoV-2 by FDA under an Emergency Use Authorization (EUA). This EUA will remain  in effect (meaning this test can be used) for the duration of the COVID-19 declaration under Section 564(b)(1) of the Act, 21 U.S.C.section 360bbb-3(b)(1), unless the authorization is terminated  or revoked sooner.       Influenza A by PCR NEGATIVE NEGATIVE Final   Influenza B by PCR NEGATIVE NEGATIVE Final    Comment: (NOTE) The Xpert Xpress SARS-CoV-2/FLU/RSV plus assay is intended as an aid in the diagnosis of influenza from Nasopharyngeal swab specimens and should not be used as a sole basis for treatment. Nasal washings and aspirates are unacceptable for Xpert Xpress SARS-CoV-2/FLU/RSV testing.  Fact Sheet for Patients: BloggerCourse.com  Fact Sheet for Healthcare Providers: SeriousBroker.it  This test is not yet approved or cleared by the  [place] Architectural technologist and has been authorized for detection and/or diagnosis of SARS-CoV-2 by FDA under an Print production planner (EUA). This EUA will remain in effect (meaning this test can be used) for the duration of the COVID-19 declaration under Section 564(b)(1) of the Act, 21 U.S.C. section 360bbb-3(b)(1), unless the authorization is terminated  or revoked.  Performed at [facility], [address], [place], [place] [address]   Body fluid culture w Gram Stain     Status: None   Collection Time: 08/19/22  4:06 PM   Specimen: Synovium; Synovial Fluid  Result Value Ref Range Status   Specimen Description   Final    SYNOVIAL Performed at [facility] [address], [place], [place] [address]    Special Requests RIGHT KNEE  Final   Gram Stain NO WBC SEEN NO ORGANISMS SEEN   Final   Culture   Final    RARE METHICILLIN RESISTANT STAPHYLOCOCCUS AUREUS CRITICAL RESULT CALLED TO, READ BACK BY AND VERIFIED WITH: RN [email or web address] AT 1229 ON 08/21/2022 BY T.SAAD. Performed at [facility], [place] [address], [place], [place] [address]    Report Status 08/22/2022 FINAL  Final   Organism ID, Bacteria METHICILLIN RESISTANT STAPHYLOCOCCUS AUREUS  Final      Susceptibility   Methicillin resistant staphylococcus aureus - MIC*    CIPROFLOXACIN >=8 RESISTANT Resistant     ERYTHROMYCIN >=8 RESISTANT Resistant     GENTAMICIN <=0.5 SENSITIVE Sensitive     OXACILLIN >=4 RESISTANT Resistant     TETRACYCLINE <=1 SENSITIVE Sensitive     VANCOMYCIN 1 SENSITIVE Sensitive     TRIMETH/SULFA >=320 RESISTANT Resistant     CLINDAMYCIN <=0.25 SENSITIVE Sensitive     RIFAMPIN <=0.5 SENSITIVE Sensitive     Inducible Clindamycin NEGATIVE Sensitive     * RARE METHICILLIN RESISTANT STAPHYLOCOCCUS AUREUS  Anaerobic culture w Gram Stain     Status: None   Collection Time: 08/19/22  4:07 PM   Specimen: Synovium; Synovial Fluid  Result Value Ref Range Status   Specimen Description   Final    SYNOVIAL Performed at [facility] [address], [place], [place] [address]    Special Requests RIGHT KNEE  Final   Gram Stain NO WBC SEEN NO ORGANISMS SEEN   Final   Culture   Final    NO ANAEROBES ISOLATED Performed at [facility], [place] [address], [place], [place] [address]    Report Status  08/24/2022 FINAL  Final  Aerobic/Anaerobic Culture w Gram Stain (surgical/deep wound)     Status: None (Preliminary result)   Collection Time: 08/23/22  2:58 PM   Specimen: Joint, Other; Body Fluid  Result Value Ref Range Status   Specimen Description   Final    FLUID Performed at [facility] [address], [place], [place] [address]    Special Requests RIGHT KNEE  Final   Gram Stain NO ORGANISMS SEEN NO WBC SEEN   Final   Culture   Final    NO GROWTH < 12 HOURS Performed at [facility], [place] [address] [place], [place] [address]    Report Status PENDING  Incomplete  Aerobic/Anaerobic Culture w Gram Stain (surgical/deep wound)     Status: None (Preliminary result)   Collection Time: 08/23/22  3:21 PM   Specimen: Joint, Other; Body Fluid  Result Value Ref Range Status   Specimen Description   Final    SYNOVIAL Performed at  [facility], [address], [place], [place] [address]    Special Requests RIGHT KNEE  Final   Gram Stain   Final    NO ORGANISMS SEEN RARE WBC PRESENT,BOTH PMN AND MONONUCLEAR    Culture   Final    NO GROWTH < 12 HOURS Performed at [facility], [address], [place], [place] [address]    Report Status PENDING  Incomplete  Aerobic/Anaerobic Culture w Gram Stain (surgical/deep wound)     Status: None (Preliminary result)   Collection Time: 08/23/22  3:24 PM   Specimen: Synovial, Right Knee; Body Fluid  Result Value Ref Range Status   Specimen Description   Final    TISSUE Performed at [facility], [address], [place], [place] [address]    Special Requests RIGHT KNEE SYNOVIAL DEEP TISSUE 1  Final   Gram Stain NO ORGANISMS SEEN NO WBC SEEN   Final   Culture   Final    NO GROWTH < 12 HOURS Performed at [facility], [address], [place], [place] [address]    Report Status PENDING  Incomplete  Aerobic/Anaerobic Culture w Gram Stain (surgical/deep wound)      Status: None (Preliminary result)   Collection Time: 08/23/22  3:26 PM   Specimen: Synovial, Right Knee; Body Fluid  Result Value Ref Range Status   Specimen Description   Final    TISSUE Performed at [facility], [address], [place], [place] [address]    Special Requests RIGHT KNEE SYNOVIAL DEEP TISSUE 2  Final   Gram Stain NO ORGANISMS SEEN NO WBC SEEN   Final   Culture   Final    NO GROWTH < 12 HOURS Performed at [facility], [address] [place], [place] [address]    Report Status PENDING  Incomplete  Aerobic/Anaerobic Culture w Gram Stain (surgical/deep wound)     Status: None (Preliminary result)   Collection Time: 08/23/22  3:27 PM   Specimen: Synovial, Right Knee; Body Fluid  Result Value Ref Range Status   Specimen Description   Final    TISSUE Performed at [facility], [address] [place], [place] [address]    Special Requests RIGHT KNEE SYNOVIAL DEEP TISSUE 3  Final   Gram Stain NO ORGANISMS SEEN NO WBC SEEN   Final   Culture   Final    NO GROWTH < 12 HOURS Performed at [facility], [address], [place], [place] [address]    Report Status PENDING  Incomplete  Aerobic/Anaerobic Culture w Gram Stain (surgical/deep wound)     Status: None (Preliminary result)   Collection Time: 08/23/22  3:38 PM   Specimen: Soft Tissue, Other  Result Value Ref Range Status   Specimen Description   Final    TISSUE Performed at [facility], [address], [place], [place] [address]    Special Requests RIGHT KNEE PREPATELLAR TISSUE  Final   Gram Stain NO ORGANISMS SEEN NO WBC SEEN   Final   Culture   Final    NO GROWTH < 12 HOURS Performed at [facility], [address] [place], [place] [address]    Report Status PENDING  Incomplete    Studies/Results: No results found.    Assessment/Plan:  INTERVAL HISTORY:   She is sp surgery Principal Problem:   Septic arthritis of  knee, right (HCC) Active Problems:   Essential  hypertension   Asthma, chronic   GERD   Severe recurrent major depression without psychotic features (HCC)   Cellulitis   GAD (generalized anxiety disorder)   IVDU (intravenous drug user)   Chronic hepatitis C without hepatic coma (HCC)   Cellulitis of knee, right    Claire [name] is a 36 y.o. 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 S/p aspiration by ortho with only 513 WBC, no purulence  Her  fluid culture came back positive for methicillin resistant Staphylococcus aureus.  Despite her joint fluid not showing large amounts of inflammation we felt it would be prudent for her in the context of her deep pain in the knee and limited mobility and positive culture for her to proceed with surgery with orthopedics which was done yesterday.  Dr Odis Hollingshead took the patient to the operating room yesterday and noted frank purulence under an eschar in her wound.  This was cultured.  The eschar was found to communicate with a sinus tract down to the prepatellar bursa the sinus tract was excised and the prepatellar bursa was excised and sent for pathology and microbiology cultures.  He then had further I&D with exploration of the joint with a was no evidence of synovitis or frank purulence but deep cultures were taken and arthrotomy performed with irrigation of the knee.  Patient is currently recovering from surgery.  Intraoperative cultures are incubating.  She remains on vancomycin.  While she has a PICC I would NOT be comfortable with DC with IV abx and it should be DC prior to dC. I would likely proceed with zyvox x 4 week and then doxy x 2 weeks  Hepatitis C: conitnue epclusa   I spent 52 minutes with the patient including than 50% of the time in face  to face counseling of the patient  re her mRSA septic joint her hepatitis C,  along with review of medical records in preparation for the visit and during the visit and in coordination of her care.   Would continue vancomycin  Hepatitis C continue Epclusa  I spent 54 minutes with the patient including than 50% of the time in face to face counseling of the patient regarding her possible septic arthritis,  along with review of medical records in preparation for the visit and during the visit and in coordination of her care with Dr. Odis Hollingshead and Dr. Jonathon Bellows.    LOS: 5 days   Paulette Blanch Dam 08/24/2022, 2:21 PM

## 2022-08-24 NOTE — Progress Notes (Signed)
Subjective: 1 Day Post-Op Procedure(s) (LRB): IRRIGATION AND DEBRIDEMENT KNEE (Right)  Patient reports pain as mild to moderate. She has been getting good oral intake.  Objective:   VITALS:  Temp:  [97.8 F (36.6 C)-98.3 F (36.8 C)] 97.8 F (36.6 C) (09/26 0559) Pulse Rate:  [78-110] 96 (09/26 0559) Resp:  [10-23] 18 (09/26 0559) BP: (106-152)/(76-98) 106/90 (09/26 0559) SpO2:  [96 %-100 %] 100 % (09/26 0559)  Gen: AAOx3, NAD Comfortable at rest  Right Lower Extremity: Incisional vac in place holding suction Improving erythema around right knee HF/KE/KF/ADF/APF/EHL 5/5 SILT throughout DP, PT 2+ to palp CR < 2s    LABS Recent Labs    08/22/22 0538  HGB 14.2  WBC 5.0  PLT 303   Recent Labs    08/22/22 0538 08/23/22 1229  NA 137 137  K 4.4 4.4  CL 107 108  CO2 22 22  BUN 19 20  CREATININE 0.79 0.82  GLUCOSE 90 53*   No results for input(s): "LABPT", "INR" in the last 72 hours.   Assessment/Plan: 1 Day Post-Op Procedure(s) (LRB): IRRIGATION AND DEBRIDEMENT KNEE (Right)  -stable on RNF under hospitalist service -IV antibiotics per ID based on intraoperative cultures -WBAT operative extremity, avoid knee flexion beyond 90 deg until incision fully healed -maintain Prevena incisional wound vac x 7 days postop. Transition to Aquacel dressing afterward -VTE ppx: Aspirin 81 mg twice daily or chemoppx as per primary team choice -follow up as outpatient in 7-10 days for wound check -sutures out in 2-3 weeks in outpatient office  Armond Hang 08/24/2022, 12:23 PM

## 2022-08-24 NOTE — Anesthesia Postprocedure Evaluation (Signed)
Anesthesia Post Note  Patient: Claire [name]  Procedure(s) Performed: IRRIGATION AND DEBRIDEMENT KNEE (Right: Knee)     Patient location during evaluation: PACU Anesthesia Type: General Level of consciousness: awake and alert Pain management: pain level controlled Vital Signs Assessment: post-procedure vital signs reviewed and stable Respiratory status: spontaneous breathing, nonlabored ventilation, respiratory function stable and patient connected to nasal cannula oxygen Cardiovascular status: blood pressure returned to baseline and stable Postop Assessment: no apparent nausea or vomiting Anesthetic complications: no   No notable events documented.  Last Vitals:  Vitals:   08/24/22 0150 08/24/22 0559  BP: 128/80 (!) 106/90  Pulse: (!) 107 96  Resp: 20 18  Temp: 36.8 C 36.6 C  SpO2: 97% 100%                  Audry Pili

## 2022-08-24 NOTE — Progress Notes (Signed)
Assisted patient to the [facility], and patient requested PRN ativan, zofran and pain medication. Due to multiple pain medications being ordered, asked MD for clarification. Norco was discontinued. Took patient zofran, oxycodone, tylenol and ativan. Patient was drawsy, but arousable. Patient rated her pain at a 9/10. She became agitated when she was offered PRN oxycodone for pain. She stated that "only dilaudid works for my pain" Educated patient on the use of dilaudid for "breakthrough pain". Offered to return oxycodone and reach out to MD for clarification but patient declined. After taking oxycodone patient stated, " Donnald Garre been hurting all night and you're giving me a hard time, I want to talk to your nurse manager." Charge RN was notified.

## 2022-08-24 NOTE — Plan of Care (Signed)

## 2022-08-25 ENCOUNTER — Other Ambulatory Visit (HOSPITAL_COMMUNITY): Payer: Self-pay

## 2022-08-25 ENCOUNTER — Telehealth (HOSPITAL_COMMUNITY): Payer: Self-pay | Admitting: Pharmacy Technician

## 2022-08-25 DIAGNOSIS — B182 Chronic viral hepatitis C: Secondary | ICD-10-CM | POA: Diagnosis not present

## 2022-08-25 DIAGNOSIS — L03115 Cellulitis of right lower limb: Secondary | ICD-10-CM | POA: Diagnosis not present

## 2022-08-25 DIAGNOSIS — J45909 Unspecified asthma, uncomplicated: Secondary | ICD-10-CM | POA: Diagnosis not present

## 2022-08-25 DIAGNOSIS — M00061 Staphylococcal arthritis, right knee: Secondary | ICD-10-CM | POA: Diagnosis not present

## 2022-08-25 MED ORDER — BUSPIRONE HCL 5 MG PO TABS
15.0000 mg | ORAL_TABLET | Freq: Three times a day (TID) | ORAL | Status: DC
  Administered 2022-08-26 (×2): 15 mg via ORAL
  Filled 2022-08-25 (×2): qty 1

## 2022-08-25 MED ORDER — BUSPIRONE HCL 5 MG PO TABS: 7.5000 mg | ORAL_TABLET | Freq: Three times a day (TID) | ORAL | Status: DC

## 2022-08-25 NOTE — TOC Benefit Eligibility Note (Signed)
Patient Teacher, English as a foreign language completed.    The patient is currently admitted and upon discharge could be taking linezolid (Zyvox) 600 mg.  The current 28 day co-pay is $0.00.   The patient is insured through [place] Patient [place] Patient Advocate Team Direct Number: [phone]  Fax: [phone]

## 2022-08-25 NOTE — Progress Notes (Signed)
Subjective: No new complaints  Antibiotics:  Anti-infectives (From admission, onward)    Start     Dose/Rate Route Frequency Ordered Stop   08/24/22 1000  vancomycin (VANCOCIN) IVPB 1000 mg/200 mL premix        1,000 mg 200 mL/hr over 60 Minutes Intravenous Every 12 hours 08/23/22 2139     08/24/22 0600  ceFAZolin (ANCEF) IVPB 2g/100 mL premix        2 g 200 mL/hr over 30 Minutes Intravenous On call to O.R. 08/23/22 1356 08/24/22 0630   08/23/22 2200  ceFAZolin (ANCEF) IVPB 1 g/50 mL premix        1 g 100 mL/hr over 30 Minutes Intravenous Every 8 hours 08/23/22 1647 08/25/22 0700   08/23/22 1600  vancomycin (VANCOCIN) powder  Status:  Discontinued          As needed 08/23/22 1605 08/23/22 1651   08/23/22 1338  ceFAZolin (ANCEF) 2-4 GM/100ML-% IVPB       Note to Pharmacy: Claire [name] O: cabinet override      08/23/22 1338 08/23/22 1501   08/21/22 1400  ceFAZolin (ANCEF) IVPB 2g/100 mL premix  Status:  Discontinued        2 g 200 mL/hr over 30 Minutes Intravenous Every 8 hours 08/21/22 1144 08/22/22 1126   08/20/22 2000  Sofosbuvir-Velpatasvir 400-100 MG TABS 1 tablet        1 tablet Oral Daily 08/20/22 1837     08/19/22 1000  Sofosbuvir-Velpatasvir 400-100 MG TABS 1 tablet  Status:  Discontinued        1 tablet Oral Daily 08/18/22 2320 08/20/22 1837   08/19/22 0800  cefTRIAXone (ROCEPHIN) 1 g in sodium chloride 0.9 % 100 mL IVPB  Status:  Discontinued        1 g 200 mL/hr over 30 Minutes Intravenous Every 24 hours 08/18/22 2248 08/18/22 2315   08/19/22 0800  vancomycin (VANCOREADY) IVPB 1500 mg/300 mL  Status:  Discontinued        1,500 mg 150 mL/hr over 120 Minutes Intravenous Every 12 hours 08/18/22 2334 08/23/22 2139   08/18/22 1945  ceFEPIme (MAXIPIME) 2 g in sodium chloride 0.9 % 100 mL IVPB        2 g 200 mL/hr over 30 Minutes Intravenous  Once 08/18/22 1930 08/18/22 2028   08/18/22 1900  vancomycin (VANCOCIN) IVPB 1000 mg/200 mL premix        1,000 mg 200  mL/hr over 60 Minutes Intravenous  Once 08/18/22 1848 08/18/22 2125   08/18/22 1900  ceFEPIme (MAXIPIME) 1 g in sodium chloride 0.9 % 100 mL IVPB  Status:  Discontinued        1 g 200 mL/hr over 30 Minutes Intravenous  Once 08/18/22 1848 08/18/22 1930       Medications: Scheduled Meds:  [START ON 08/26/2022] busPIRone  15 mg Oral TID   Followed by   Melene Muller ON 08/27/2022] busPIRone  7.5 mg Oral TID   busPIRone  30 mg Oral TID   ferrous sulfate  325 mg Oral BID WC   lamoTRIgine  50 mg Oral Daily   mometasone-formoterol  2 puff Inhalation BID   montelukast  10 mg Oral QHS   nicotine  14 mg Transdermal Daily   pantoprazole  40 mg Oral Daily   pregabalin  225 mg Oral TID   QUEtiapine  50 mg Oral QHS   sodium chloride flush  10-40 mL Intracatheter Q12H   sodium  chloride flush  3 mL Intravenous Q12H   Sofosbuvir-Velpatasvir  1 tablet Oral Daily   vortioxetine HBr  20 mg Oral QHS   Continuous Infusions:  methocarbamol (ROBAXIN) IV     vancomycin 1,000 mg (08/25/22 0927)   PRN Meds:.acetaminophen **OR** acetaminophen, albuterol, diphenhydrAMINE, hydrocortisone cream, HYDROmorphone (DILAUDID) injection, ketorolac, loratadine, LORazepam, methocarbamol (ROBAXIN) IV, ondansetron (ZOFRAN) IV, oxyCODONE, polyethylene glycol, sodium chloride flush    Objective: Weight change:   Intake/Output Summary (Last 24 hours) at 08/25/2022 1243 Last data filed at 08/25/2022 0900 Gross per 24 hour  Intake 1400 ml  Output 0 ml  Net 1400 ml    Blood pressure 130/78, pulse 84, temperature 98 F (36.7 C), resp. rate 20, height 5\' 10"  (1.778 m), weight 86.2 kg, SpO2 99 %. Temp:  [98 F (36.7 C)-99 F (37.2 C)] 98 F (36.7 C) (09/27 0405) Pulse Rate:  [84-97] 84 (09/27 0405) Resp:  [17-20] 20 (09/27 0405) BP: (125-156)/(57-84) 130/78 (09/27 0405) SpO2:  [98 %-100 %] 99 % (09/27 0824)  Physical Exam: Physical Exam Constitutional:      General: She is not in acute distress.    Appearance: She is  well-developed. She is not diaphoretic.  HENT:     Head: Normocephalic and atraumatic.     Right Ear: External ear normal.     Left Ear: External ear normal.     Mouth/Throat:     Pharynx: No oropharyngeal exudate.  Eyes:     General: No scleral icterus.    Conjunctiva/sclera: Conjunctivae normal.     Pupils: Pupils are equal, round, and reactive to light.  Cardiovascular:     Rate and Rhythm: Normal rate and regular rhythm.  Pulmonary:     Effort: Pulmonary effort is normal. No respiratory distress.     Breath sounds: No wheezing or rales.  Abdominal:     General: There is no distension.     Palpations: Abdomen is soft.  Musculoskeletal:        General: No tenderness. Normal range of motion.  Lymphadenopathy:     Cervical: No cervical adenopathy.  Skin:    General: Skin is warm and dry.     Coloration: Skin is not pale.     Findings: No erythema or rash.  Neurological:     General: No focal deficit present.     Mental Status: She is alert and oriented to person, place, and time.     Motor: No abnormal muscle tone.     Coordination: Coordination normal.  Psychiatric:        Mood and Affect: Mood normal.        Behavior: Behavior normal.        Thought Content: Thought content normal.        Judgment: Judgment normal.     Knee bandaged  CBC:    BMET Recent Labs    08/23/22 1229 08/24/22 1145  NA 137 133*  K 4.4 3.8  CL 108 105  CO2 22 22  GLUCOSE 53* 76  BUN 20 25*  CREATININE 0.82 0.96  CALCIUM 9.3 8.6*      Liver Panel  No results for input(s): "PROT", "ALBUMIN", "AST", "ALT", "ALKPHOS", "BILITOT", "BILIDIR", "IBILI" in the last 72 hours.     Sedimentation Rate No results for input(s): "ESRSEDRATE" in the last 72 hours. C-Reactive Protein No results for input(s): "CRP" in the last 72 hours.  Micro Results: Recent Results (from the past 720 hour(s))  Blood culture (routine x 2)  Status: None   Collection Time: 08/18/22  6:21 PM    Specimen: BLOOD  Result Value Ref Range Status   Specimen Description   Final    BLOOD BLOOD RIGHT HAND Performed at [facility], [address], [place], [place] [address]    Special Requests   Final    Blood Culture adequate volume BOTTLES DRAWN AEROBIC AND ANAEROBIC Performed at [facility], [address] [place], [place] [address]    Culture   Final    NO GROWTH 5 DAYS Performed at [facility], [address], [place], [place] [address]    Report Status 08/24/2022 FINAL  Final  Blood culture (routine x 2)     Status: None   Collection Time: 08/18/22  7:43 PM   Specimen: BLOOD  Result Value Ref Range Status   Specimen Description   Final    BLOOD RIGHT ANTECUBITAL Performed at [facility], [address] [place], [place] [address]    Special Requests   Final    Blood Culture adequate volume BOTTLES DRAWN AEROBIC AND ANAEROBIC Performed at [facility], [address] [place], [place] [address]    Culture   Final    NO GROWTH 5 DAYS Performed at [facility], [address] [place], [place] [address]    Report Status 08/24/2022 FINAL  Final  Resp Panel by RT-PCR (Flu A&B, Covid) Anterior Nasal Swab     Status: None   Collection Time: 08/18/22  7:50 PM   Specimen: Anterior Nasal Swab  Result Value Ref Range Status   SARS Coronavirus 2 by RT PCR NEGATIVE NEGATIVE Final    Comment: (NOTE) SARS-CoV-2 target nucleic acids are NOT DETECTED.  The SARS-CoV-2 RNA is generally detectable in upper respiratory specimens during the acute phase of infection. The lowest concentration of SARS-CoV-2 viral copies this assay can detect is 138 copies/mL. A negative result does not preclude SARS-Cov-2 infection and should not be used as the sole basis for treatment or other patient management decisions. A negative result may occur with  improper specimen collection/handling, submission of  specimen other than nasopharyngeal swab, presence of viral mutation(s) within the areas targeted by this assay, and inadequate number of viral copies(<138 copies/mL). A negative result must be combined with clinical observations, patient history, and epidemiological information. The expected result is Negative.  Fact Sheet for Patients:  BloggerCourse.com  Fact Sheet for Healthcare Providers:  SeriousBroker.it  This test is no t yet approved or cleared by the [place] FDA and  has been authorized for detection and/or diagnosis of SARS-CoV-2 by FDA under an Emergency Use Authorization (EUA). This EUA will remain  in effect (meaning this test can be used) for the duration of the COVID-19 declaration under Section 564(b)(1) of the Act, 21 U.S.C.section 360bbb-3(b)(1), unless the authorization is terminated  or revoked sooner.       Influenza A by PCR NEGATIVE NEGATIVE Final   Influenza B by PCR NEGATIVE NEGATIVE Final    Comment: (NOTE) The Xpert Xpress SARS-CoV-2/FLU/RSV plus assay is intended as an aid in the diagnosis of influenza from Nasopharyngeal swab specimens and should not be used as a sole basis for treatment. Nasal washings and aspirates are unacceptable for Xpert Xpress SARS-CoV-2/FLU/RSV testing.  Fact Sheet for Patients: BloggerCourse.com  Fact Sheet for Healthcare Providers: SeriousBroker.it  This test is not yet approved or cleared by the [place] FDA and has been authorized for detection and/or diagnosis of  SARS-CoV-2 by FDA under an Emergency Use Authorization (EUA). This EUA will remain in effect (meaning this test can be used) for the duration of the COVID-19 declaration under Section 564(b)(1) of the Act, 21 U.S.C. section 360bbb-3(b)(1), unless the authorization is terminated or revoked.  Performed at [facility], [address] [place], [place] [address]   Body fluid culture w Gram Stain     Status: None   Collection Time: 08/19/22  4:06 PM   Specimen: Synovium; Synovial Fluid  Result Value Ref Range Status   Specimen Description   Final    SYNOVIAL Performed at [facility], [address], [place], [place] [address]    Special Requests RIGHT KNEE  Final   Gram Stain NO WBC SEEN NO ORGANISMS SEEN   Final   Culture   Final    RARE METHICILLIN RESISTANT STAPHYLOCOCCUS AUREUS CRITICAL RESULT CALLED TO, READ BACK BY AND VERIFIED WITH: RN [email or web address] AT 1229 ON 08/21/2022 BY T.SAAD. Performed at [facility], [address] [place], [place] [address]    Report Status 08/22/2022 FINAL  Final   Organism ID, Bacteria METHICILLIN RESISTANT STAPHYLOCOCCUS AUREUS  Final      Susceptibility   Methicillin resistant staphylococcus aureus - MIC*    CIPROFLOXACIN >=8 RESISTANT Resistant     ERYTHROMYCIN >=8 RESISTANT Resistant     GENTAMICIN <=0.5 SENSITIVE Sensitive     OXACILLIN >=4 RESISTANT Resistant     TETRACYCLINE <=1 SENSITIVE Sensitive     VANCOMYCIN 1 SENSITIVE Sensitive     TRIMETH/SULFA >=320 RESISTANT Resistant     CLINDAMYCIN <=0.25 SENSITIVE Sensitive     RIFAMPIN <=0.5 SENSITIVE Sensitive     Inducible Clindamycin NEGATIVE Sensitive     * RARE METHICILLIN RESISTANT STAPHYLOCOCCUS AUREUS  Anaerobic culture w Gram Stain     Status: None   Collection Time: 08/19/22  4:07 PM   Specimen: Synovium; Synovial Fluid  Result Value Ref Range Status   Specimen Description   Final    SYNOVIAL Performed at [facility], [address] [place], [place] [address]    Special Requests RIGHT KNEE  Final   Gram Stain NO WBC SEEN NO ORGANISMS SEEN   Final   Culture   Final    NO ANAEROBES ISOLATED Performed at [facility], [address], [place], [place] [address]    Report Status 08/24/2022 FINAL  Final  Aerobic/Anaerobic Culture w Gram Stain  (surgical/deep wound)     Status: None (Preliminary result)   Collection Time: 08/23/22  2:58 PM   Specimen: Joint, Other; Body Fluid  Result Value Ref Range Status   Specimen Description   Final    FLUID Performed at [facility], [address], [place], [place] [address]    Special Requests RIGHT KNEE  Final   Gram Stain NO ORGANISMS SEEN NO WBC SEEN   Final   Culture   Final    NO GROWTH 2 DAYS NO ANAEROBES ISOLATED; CULTURE IN PROGRESS FOR 5 DAYS Performed at [facility], [address], [place], [place] [address]    Report Status PENDING  Incomplete  Aerobic/Anaerobic Culture w Gram Stain (surgical/deep wound)     Status: None (Preliminary result)   Collection Time: 08/23/22  3:21 PM   Specimen: Joint, Other; Body Fluid  Result Value Ref Range Status   Specimen Description   Final    SYNOVIAL Performed at [facility],  [place] [address] [place], [place] [address]    Special Requests RIGHT KNEE  Final   Gram Stain   Final    NO ORGANISMS SEEN RARE WBC PRESENT,BOTH PMN AND MONONUCLEAR    Culture   Final    NO GROWTH 2 DAYS NO ANAEROBES ISOLATED; CULTURE IN PROGRESS FOR 5 DAYS Performed at [facility], [place] [address], [place], [place] [address]    Report Status PENDING  Incomplete  Aerobic/Anaerobic Culture w Gram Stain (surgical/deep wound)     Status: None (Preliminary result)   Collection Time: 08/23/22  3:24 PM   Specimen: Synovial, Right Knee; Body Fluid  Result Value Ref Range Status   Specimen Description   Final    TISSUE Performed at [place] [address], [place], [place] [address]    Special Requests RIGHT KNEE SYNOVIAL DEEP TISSUE 1  Final   Gram Stain NO ORGANISMS SEEN NO WBC SEEN   Final   Culture   Final    NO GROWTH 2 DAYS NO ANAEROBES ISOLATED; CULTURE IN PROGRESS FOR 5 DAYS Performed at [facility], [place] [address], [place], [place] [address]    Report Status  PENDING  Incomplete  Aerobic/Anaerobic Culture w Gram Stain (surgical/deep wound)     Status: None (Preliminary result)   Collection Time: 08/23/22  3:26 PM   Specimen: Synovial, Right Knee; Body Fluid  Result Value Ref Range Status   Specimen Description   Final    TISSUE Performed at [place] [address], [place], [place] [address]    Special Requests RIGHT KNEE SYNOVIAL DEEP TISSUE 2  Final   Gram Stain NO ORGANISMS SEEN NO WBC SEEN   Final   Culture   Final    NO GROWTH 2 DAYS NO ANAEROBES ISOLATED; CULTURE IN PROGRESS FOR 5 DAYS Performed at [facility], [place] [address], [place], [place] [address]    Report Status PENDING  Incomplete  Aerobic/Anaerobic Culture w Gram Stain (surgical/deep wound)     Status: None (Preliminary result)   Collection Time: 08/23/22  3:27 PM   Specimen: Synovial, Right Knee; Body Fluid  Result Value Ref Range Status   Specimen Description   Final    TISSUE Performed at [place] [address], [place], [place] [address]    Special Requests RIGHT KNEE SYNOVIAL DEEP TISSUE 3  Final   Gram Stain NO ORGANISMS SEEN NO WBC SEEN   Final   Culture   Final    NO GROWTH 2 DAYS NO ANAEROBES ISOLATED; CULTURE IN PROGRESS FOR 5 DAYS Performed at [facility], [place] [address], [place], [place] [address]    Report Status PENDING  Incomplete  Aerobic/Anaerobic Culture w Gram Stain (surgical/deep wound)     Status: None (Preliminary result)   Collection Time: 08/23/22  3:38 PM   Specimen: Soft Tissue, Other  Result Value Ref Range Status   Specimen Description   Final    TISSUE Performed at [place] [address] [place], [place] [address]    Special Requests RIGHT KNEE PREPATELLAR TISSUE  Final   Gram Stain   Final    NO ORGANISMS SEEN NO WBC SEEN Performed at [facility], [place] [address], [place],  [address]    Culture   Final    RARE STAPHYLOCOCCUS  AUREUS CULTURE REINCUBATED FOR BETTER GROWTH CRITICAL RESULT CALLED TO, READ BACK BY AND  VERIFIED WITH: RN NATE WOODY [phone] AT 1017 BY EC NO ANAEROBES ISOLATED; CULTURE IN PROGRESS FOR 5 DAYS    Report Status PENDING  Incomplete    Studies/Results: No results found.    Assessment/Plan:  INTERVAL HISTORY: One of the operative cultures is indeed growing Staphylococcus aureus  She is sp surgery Principal Problem:   Septic arthritis of knee, right (HCC) Active Problems:   Essential hypertension   Asthma, chronic   GERD   Severe recurrent major depression without psychotic features (HCC)   Cellulitis   GAD (generalized anxiety disorder)   IVDU (intravenous drug user)   Chronic hepatitis C without hepatic coma (HCC)   Cellulitis of knee, right    Estill CottaHeather D [name] is a 36 y.o. 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 S/p aspiration by ortho with only 513 WBC, no purulence  Her  fluid culture came back positive for methicillin resistant Staphylococcus aureus.  Despite her joint fluid not showing large amounts of inflammation we felt it would be prudent for her in the context of her deep pain in the knee and limited mobility and positive culture for her to proceed with surgery with orthopedics which was done yesterday.  Dr Odis Hollingsheadamanathan took the patient to the operating room yesterday and noted frank purulence under an eschar in her wound.  This was cultured.  The eschar was found to communicate with a sinus tract down to the prepatellar bursa the sinus tract was excised and the prepatellar bursa was excised and sent for pathology and microbiology cultures.  He then had further I&D with exploration of the joint with a was no evidence of synovitis or frank purulence but deep cultures were taken  and arthrotomy performed with irrigation of the knee.    Intraoperative cultures are incubating 1 days yielding Staphylococcus aureus she remains on vancomycin.  While she has a PICC I would NOT be comfortable with DC with IV abx and it should be DC prior to dC. I would likely proceed with zyvox x 4 week and then doxy x 2 weeks  In order to facilitate this she will need to be weaned off her buspar (she is also on trintillix) to avoid serotonin syndrome  We will start reducing buspar now   Estill CottaHeather D [name] has an appointment on 09/13/2022  at [facility]2PM with Dr. Daiva EvesVan Dam at:  [facility], which  is located in the [facility] at  [address] in [place].  [address], which is located to the left of the elevators.  Phone: [phone]) [phone]  Fax: [phone]  [email or web address]  The patient should arrive 30 minutes prior to their appoitment.   Hepatitis C: Checking a hepatitis C RNA with her morning labs tomorrow.  She will continue her Epclusa and I want to make sure that her next month worth of drugs are shipped   Depression anxiety: Above discussion about need to wean BuSpar  Pain: Provided her pain is controlled on oral agents and there is plan for her to follow-up with PCP to continue management of her postoperative pain in the context of prior IV drug abuse and opiate addiction I will be comfortable with her being discharged I do want her hep C RNA checked as mentioned prior to discharge and I would also like to  make sure that she has her 4 weeks of Zyvox on hand when she leaves  I spent 52 minutes with the patient including than 50% of the time in face to face counseling of the patient regarding her septic joint with MRSA our plan is to treat this with Zyvox but need to adjust her other serotonergic drugs  along with review of medical records in preparation for the visit and during the visit and in coordination  of her care.    LOS: 6 days   Acey Lav 08/25/2022, 12:43 PM

## 2022-08-25 NOTE — Telephone Encounter (Signed)
Pharmacy Patient Advocate Encounter  Insurance verification completed.    The patient is insured through [employer]   The patient is currently admitted and ran test claims for the following: Linezolid (Zyvox).  Copays and coinsurance results were relayed to Inpatient clinical team.

## 2022-08-25 NOTE — Progress Notes (Signed)
PROGRESS NOTE    [name]  [id number] DOB: 02/18/1986 DOA: 08/18/2022 PCP: Algis Greenhouse, MD    Brief Narrative:   Claire [name] is a 36 y.o. female with past medical history significant for polysubstance/IV drug abuse, discitis/osteomyelitis, anxiety/depression, asthma, hepatitis C, history of necrotizing fasciitis right upper extremity who presented to [place] ED on 9/20 with complaints of right knee pain/swelling.  Patient reports she fell approximately 5 days prior to admission suffering a cut to her right knee.  Since she has had worsening pain and swelling since.  Denied fever.  In the ED, patient afebrile.  WBC 11.4, hemoglobin 14.2, platelets 214.  Sodium 136, potassium 4.2, chloride 107, CO2 22, glucose 124, BUN 14, creatinine 0.73.  AST 21, ALT 42, total bilirubin 0.7.  INR 1.1.  Urinalysis unrevealing.  Chest x-ray with no acute findings.  Right knee x-ray with soft tissue edema without acute osseous abnormality, no soft tissue gas.  Patient was started on IV antibiotics.  Patient was transferred to [facility] for further evaluation and management under the hospital service.  Infectious disease and orthopedics was consulted.  ID recommended incision and drainage which was performed by Dr. Cornelius Moras on 9/25.  Assessment & Plan:   Septic arthritis, MRSA; right knee Right knee cellulitis Patient presented to ED with progressive right knee pain/swelling.  Reports fall 5 days prior to admission.  On ED evaluation patient was afebrile with mild leukocytosis of 11.4.  Underwent right knee aspiration by orthopedics Dr. Kathaleen Bury on 9/21. Infectious disease was consulted who recommended I&D by orthopedics.  Patient underwent right knee arthrotomy with irrigation debridement, right prepatellar bursectomy and sinus tract excision by Dr. Kathaleen Bury 9/25. --ID following; appreciate assistance --Cx from right knee synovial fluid 9/21 + MRSA --Blood  Cultures x 2: No growth x 5 days --Operative culture 9/25 with no organisms on Gram stain; further pending --Vancomycin (ID likely planning 4-week course of Zyvox followed by doxycycline x2 weeks) --Continue wound VAC x7 days postop with transition to Aquacel dressing afterwards --Outpatient follow-up with orthopedics 7-10 days for wound check and sutures out 2-3 weeks --Pain control with Tylenol, Robaxin, Toradol, oxycodone, Dilaudid.  Asthma --Singulair 10 mg p.o. nightly --Dulera 2 puffs twice daily (substituted for home Symbicort) --Albuterol neb every 6 hours as needed wheezing/shortness of breath  Anxiety/depression --BuSpar --Seroquel 50 mg p.o. daily --Lamictal 50 mg p.o. daily --Trintellix 20mg  PO daily  Hepatitis C Follows in outpatient infectious disease clinic with Dr. Drucilla Schmidt. --Continue Epclusa  GERD -- Protonix 40 mg p.o. daily  Hx substance abuse/IVDA Reportedly abstinent x7 months.  Continue to encourage complete cessation.  DVT prophylaxis: Place and maintain sequential compression device Start: 08/19/22 1707    Code Status: Full Code Family Communication: Updated family present at bedside this morning  Disposition Plan:  Level of care: Med-Surg Status is: Inpatient Remains inpatient appropriate because: Awaiting ID signed off and transition from IV to oral antibiotics.    Consultants:  Infectious disease, Dr. Drucilla Schmidt, Dr. Yvette Rack, Dr. [facility], Dr. Kathaleen Bury  Procedures:  Right knee aspiration, Dr. Kathaleen Bury; 9/21 Right knee arthrotomy with irrigation debridement, right prepatellar bursectomy and sinus tract excision, Dr. Kathaleen Bury 9/25  Antimicrobials:  Vancomycin 9/20>> Cefepime 9/20 - 9/20 Cefazolin 9/23 - 9/26   Subjective: Patient seen examined bedside, resting comfortably.  Complaining of pain to her right knee and requesting RN for pain medication at this time.  No other specific questions or concerns at this time.  Remains  on IV vancomycin, discussed with ID will anticipate transition to oral antibiotics at some point.  Discussed will need close follow-up with orthopedics.  No other specific questions or concerns at this time.  Denies headache, no fever/chills/night sweats, no nausea/vomiting/diarrhea, no chest pain, no palpitations, no shortness of breath, no abdominal pain, no focal weakness, no fatigue, no paresthesias.  No acute events overnight per nursing staff.  Objective: Vitals:   08/24/22 2038 08/25/22 0110 08/25/22 0405 08/25/22 0824  BP: (!) 156/57 125/82 130/78   Pulse: 88  84   Resp: 17  20   Temp: 98.3 F (36.8 C)  98 F (36.7 C)   TempSrc:      SpO2: 100%  100% 99%  Weight:      Height:        Intake/Output Summary (Last 24 hours) at 08/25/2022 1214 Last data filed at 08/25/2022 0900 Gross per 24 hour  Intake 1400 ml  Output 0 ml  Net 1400 ml   Filed Weights   08/18/22 1814  Weight: 86.2 kg    Examination:  Physical Exam: GEN: NAD, alert and oriented x 3, chronically ill in appearance, appears older than stated age HEENT: NCAT, PERRL, EOMI, sclera clear, MMM PULM: CTAB w/o wheezes/crackles, normal respiratory effort, on room air CV: RRR w/o M/G/R GI: abd soft, NTND, NABS, no R/G/M MSK: no peripheral edema, moves all extremities independently, noted postsurgical changes right upper extremity, right knee with wound VAC in place NEURO: CN II-XII intact, no focal deficits, sensation to light touch intact PSYCH: normal mood/affect Integumentary: Chronic postoperative skin changes noted to right upper extremity, right knee with wound VAC in place with no concerning fluctuance/erythema, no other concerning rashes/lesions/wounds noted on exposed skin surfaces.    Data Reviewed: I have personally reviewed following labs and imaging studies  CBC: Recent Labs  Lab 08/18/22 1816 08/19/22 0523 08/20/22 0606 08/21/22 0605 08/22/22 0538  WBC 11.4* 6.0 5.2 5.3 5.0  NEUTROABS 9.0*   --   --   --   --   HGB 14.2 11.7* 13.8 12.9 14.2  HCT 43.2 37.0 43.4 40.7 45.7  MCV 90.8 93.9 95.0 96.2 94.8  PLT 212 161 226 267 [id number]   Basic Metabolic Panel: Recent Labs  Lab 08/20/22 0525 08/21/22 0605 08/22/22 0538 08/23/22 1229 08/24/22 1145  NA 137 138 137 137 133*  K 4.2 4.2 4.4 4.4 3.8  CL 111 112* 107 108 105  CO2 20* 20* 22 22 22   GLUCOSE 90 147* 90 53* 76  BUN 10 16 19 20  25*  CREATININE 0.63 0.84 0.79 0.82 0.96  CALCIUM 8.6* 8.5* 9.1 9.3 8.6*   GFR: Estimated Creatinine Clearance: 96.7 mL/min (by C-G formula based on SCr of 0.96 mg/dL). Liver Function Tests: Recent Labs  Lab 08/18/22 1816  AST 21  ALT 42  ALKPHOS 243*  BILITOT 0.7  PROT 6.9  ALBUMIN 4.1   No results for input(s): "LIPASE", "AMYLASE" in the last 168 hours. No results for input(s): "AMMONIA" in the last 168 hours. Coagulation Profile: Recent Labs  Lab 08/18/22 1848  INR 1.1   Cardiac Enzymes: No results for input(s): "CKTOTAL", "CKMB", "CKMBINDEX", "TROPONINI" in the last 168 hours. BNP (last 3 results) No results for input(s): "PROBNP" in the last 8760 hours. HbA1C: No results for input(s): "HGBA1C" in the last 72 hours. CBG: Recent Labs  Lab 08/23/22 1837  GLUCAP 126*   Lipid Profile: No results for input(s): "CHOL", "HDL", "LDLCALC", "TRIG", "CHOLHDL", "LDLDIRECT" in the  last 72 hours. Thyroid Function Tests: No results for input(s): "TSH", "T4TOTAL", "FREET4", "T3FREE", "THYROIDAB" in the last 72 hours. Anemia Panel: No results for input(s): "VITAMINB12", "FOLATE", "FERRITIN", "TIBC", "IRON", "RETICCTPCT" in the last 72 hours. Sepsis Labs: Recent Labs  Lab 08/18/22 1816 08/18/22 2255  LATICACIDVEN 0.5 0.5    Recent Results (from the past 240 hour(s))  Blood culture (routine x 2)     Status: None   Collection Time: 08/18/22  6:21 PM   Specimen: BLOOD  Result Value Ref Range Status   Specimen Description   Final    BLOOD BLOOD RIGHT HAND Performed at [facility], [address] [place], [place] [address]    Special Requests   Final    Blood Culture adequate volume BOTTLES DRAWN AEROBIC AND ANAEROBIC Performed at [facility], [address], [place], [place] [address]    Culture   Final    NO GROWTH 5 DAYS Performed at [facility], [place] [address], [place], [place] [address]    Report Status 08/24/2022 FINAL  Final  Blood culture (routine x 2)     Status: None   Collection Time: 08/18/22  7:43 PM   Specimen: BLOOD  Result Value Ref Range Status   Specimen Description   Final    BLOOD RIGHT ANTECUBITAL Performed at [facility], [address] [place], [place] [address]    Special Requests   Final    Blood Culture adequate volume BOTTLES DRAWN AEROBIC AND ANAEROBIC Performed at [facility], [address], [place], [place] [address]    Culture   Final    NO GROWTH 5 DAYS Performed at [facility], [place] [address] [place], [place] [address]    Report Status 08/24/2022 FINAL  Final  Resp Panel by RT-PCR (Flu A&B, Covid) Anterior Nasal Swab     Status: None   Collection Time: 08/18/22  7:50 PM   Specimen: Anterior Nasal Swab  Result Value Ref Range Status   SARS Coronavirus 2 by RT PCR NEGATIVE NEGATIVE Final    Comment: (NOTE) SARS-CoV-2 target nucleic acids are NOT DETECTED.  The SARS-CoV-2 RNA is generally detectable in upper respiratory specimens during the acute phase of infection. The lowest concentration of SARS-CoV-2 viral copies this assay can detect is 138 copies/mL. A negative result does not preclude SARS-Cov-2 infection and should not be used as the sole basis for treatment or other patient management decisions. A negative result may occur with  improper specimen collection/handling, submission of specimen other than nasopharyngeal swab, presence of viral mutation(s) within the areas targeted by this assay, and inadequate  number of viral copies(<138 copies/mL). A negative result must be combined with clinical observations, patient history, and epidemiological information. The expected result is Negative.  Fact Sheet for Patients:  EntrepreneurPulse.com.au  Fact Sheet for Healthcare Providers:  IncredibleEmployment.be  This test is no t yet approved or cleared by the [place] FDA and  has been authorized for detection and/or diagnosis of SARS-CoV-2 by FDA under an Emergency Use Authorization (EUA). This EUA will remain  in effect (meaning this test can be used) for the duration of the COVID-19 declaration under Section 564(b)(1) of the Act, 21 U.S.C.section 360bbb-3(b)(1), unless the authorization is terminated  or revoked sooner.       Influenza A by PCR NEGATIVE NEGATIVE Final   Influenza B by PCR NEGATIVE NEGATIVE Final    Comment: (NOTE) The Xpert Xpress SARS-CoV-2/FLU/RSV plus assay is  intended as an aid in the diagnosis of influenza from Nasopharyngeal swab specimens and should not be used as a sole basis for treatment. Nasal washings and aspirates are unacceptable for Xpert Xpress SARS-CoV-2/FLU/RSV testing.  Fact Sheet for Patients: EntrepreneurPulse.com.au  Fact Sheet for Healthcare Providers: IncredibleEmployment.be  This test is not yet approved or cleared by the [place] FDA and has been authorized for detection and/or diagnosis of SARS-CoV-2 by FDA under an Emergency Use Authorization (EUA). This EUA will remain in effect (meaning this test can be used) for the duration of the COVID-19 declaration under Section 564(b)(1) of the Act, 21 U.S.C. section 360bbb-3(b)(1), unless the authorization is terminated or revoked.  Performed at [facility], [address] [place], [place] [address]   Body fluid culture w Gram Stain     Status: None   Collection Time: 08/19/22  4:06 PM    Specimen: Synovium; Synovial Fluid  Result Value Ref Range Status   Specimen Description   Final    SYNOVIAL Performed at [facility] [address], [place], [place] [address]    Special Requests RIGHT KNEE  Final   Gram Stain NO WBC SEEN NO ORGANISMS SEEN   Final   Culture   Final    RARE METHICILLIN RESISTANT STAPHYLOCOCCUS AUREUS CRITICAL RESULT CALLED TO, READ BACK BY AND VERIFIED WITH: RN [email or web address] AT 1229 ON 08/21/2022 BY T.SAAD. Performed at [facility], [place] [address], [place], [place] [address]    Report Status 08/22/2022 FINAL  Final   Organism ID, Bacteria METHICILLIN RESISTANT STAPHYLOCOCCUS AUREUS  Final      Susceptibility   Methicillin resistant staphylococcus aureus - MIC*    CIPROFLOXACIN >=8 RESISTANT Resistant     ERYTHROMYCIN >=8 RESISTANT Resistant     GENTAMICIN <=0.5 SENSITIVE Sensitive     OXACILLIN >=4 RESISTANT Resistant     TETRACYCLINE <=1 SENSITIVE Sensitive     VANCOMYCIN 1 SENSITIVE Sensitive     TRIMETH/SULFA >=320 RESISTANT Resistant     CLINDAMYCIN <=0.25 SENSITIVE Sensitive     RIFAMPIN <=0.5 SENSITIVE Sensitive     Inducible Clindamycin NEGATIVE Sensitive     * RARE METHICILLIN RESISTANT STAPHYLOCOCCUS AUREUS  Anaerobic culture w Gram Stain     Status: None   Collection Time: 08/19/22  4:07 PM   Specimen: Synovium; Synovial Fluid  Result Value Ref Range Status   Specimen Description   Final    SYNOVIAL Performed at [facility] [address], [place], [place] [address]    Special Requests RIGHT KNEE  Final   Gram Stain NO WBC SEEN NO ORGANISMS SEEN   Final   Culture   Final    NO ANAEROBES ISOLATED Performed at [facility], [place] [address], [place], [place] [address]    Report Status 08/24/2022 FINAL  Final  Aerobic/Anaerobic Culture w Gram Stain (surgical/deep wound)     Status: None (Preliminary result)   Collection Time: 08/23/22  2:58 PM   Specimen: Joint, Other; Body Fluid   Result Value Ref Range Status   Specimen Description   Final    FLUID Performed at [facility] [address], [place], [place] [address]    Special Requests RIGHT KNEE  Final   Gram Stain NO ORGANISMS SEEN NO WBC SEEN   Final   Culture   Final    NO GROWTH 2 DAYS NO ANAEROBES ISOLATED; CULTURE IN PROGRESS FOR 5 DAYS Performed at [facility], [place] [address] [place], [place] [address]    Report Status PENDING  Incomplete  Aerobic/Anaerobic Culture w Gram Stain (surgical/deep wound)     Status: None (Preliminary result)   Collection Time: 08/23/22  3:21 PM   Specimen: Joint, Other; Body Fluid  Result Value Ref Range Status   Specimen Description   Final    SYNOVIAL Performed at [facility] [address] [place], [place] [address]    Special Requests RIGHT KNEE  Final   Gram Stain   Final    NO ORGANISMS SEEN RARE WBC PRESENT,BOTH PMN AND MONONUCLEAR    Culture   Final    NO GROWTH 2 DAYS Performed at [facility], [place] [address] [place], [place] [address]    Report Status PENDING  Incomplete  Aerobic/Anaerobic Culture w Gram Stain (surgical/deep wound)     Status: None (Preliminary result)   Collection Time: 08/23/22  3:24 PM   Specimen: Synovial, Right Knee; Body Fluid  Result Value Ref Range Status   Specimen Description   Final    TISSUE Performed at [facility] [address], [place], [place] [address]    Special Requests RIGHT KNEE SYNOVIAL DEEP TISSUE 1  Final   Gram Stain NO ORGANISMS SEEN NO WBC SEEN   Final   Culture   Final    NO GROWTH 2 DAYS NO ANAEROBES ISOLATED; CULTURE IN PROGRESS FOR 5 DAYS Performed at [facility], [place] [address] [place], [place] [address]    Report Status PENDING  Incomplete  Aerobic/Anaerobic Culture w Gram Stain (surgical/deep wound)     Status: None (Preliminary result)   Collection Time: 08/23/22  3:26 PM   Specimen: Synovial, Right Knee;  Body Fluid  Result Value Ref Range Status   Specimen Description   Final    TISSUE Performed at [facility] [address], [place], [place] [address]    Special Requests RIGHT KNEE SYNOVIAL DEEP TISSUE 2  Final   Gram Stain NO ORGANISMS SEEN NO WBC SEEN   Final   Culture   Final    NO GROWTH 2 DAYS NO ANAEROBES ISOLATED; CULTURE IN PROGRESS FOR 5 DAYS Performed at [facility], [place] [address] [place], [place] [address]    Report Status PENDING  Incomplete  Aerobic/Anaerobic Culture w Gram Stain (surgical/deep wound)     Status: None (Preliminary result)   Collection Time: 08/23/22  3:27 PM   Specimen: Synovial, Right Knee; Body Fluid  Result Value Ref Range Status   Specimen Description   Final    TISSUE Performed at [facility] [address] [place], [place] [address]    Special Requests RIGHT KNEE SYNOVIAL DEEP TISSUE 3  Final   Gram Stain NO ORGANISMS SEEN NO WBC SEEN   Final   Culture   Final    NO GROWTH 2 DAYS NO ANAEROBES ISOLATED; CULTURE IN PROGRESS FOR 5 DAYS Performed at [facility], [place] [address], [place], [place] [address]    Report Status PENDING  Incomplete  Aerobic/Anaerobic Culture w Gram Stain (surgical/deep wound)     Status: None (Preliminary result)   Collection Time: 08/23/22  3:38 PM   Specimen: Soft Tissue, Other  Result Value Ref Range Status   Specimen Description   Final    TISSUE Performed at [facility] [address], [place], [place] [address]    Special Requests  RIGHT KNEE PREPATELLAR TISSUE  Final   Gram Stain   Final    NO ORGANISMS SEEN NO WBC SEEN Performed at [facility], [address] [place], [place] [address]    Culture   Final    RARE STAPHYLOCOCCUS AUREUS CULTURE REINCUBATED FOR BETTER GROWTH CRITICAL RESULT CALLED TO, READ BACK BY AND VERIFIED WITH: RN NATE WOODY [id number] AT 9 BY EC NO ANAEROBES ISOLATED; CULTURE IN PROGRESS FOR 5  DAYS    Report Status PENDING  Incomplete         Radiology Studies: No results found.      Scheduled Meds:  [START ON 08/26/2022] busPIRone  15 mg Oral TID   Followed by   Derrill Memo ON 08/27/2022] busPIRone  7.5 mg Oral TID   busPIRone  30 mg Oral TID   ferrous sulfate  325 mg Oral BID WC   lamoTRIgine  50 mg Oral Daily   mometasone-formoterol  2 puff Inhalation BID   montelukast  10 mg Oral QHS   nicotine  14 mg Transdermal Daily   pantoprazole  40 mg Oral Daily   pregabalin  225 mg Oral TID   QUEtiapine  50 mg Oral QHS   sodium chloride flush  10-40 mL Intracatheter Q12H   sodium chloride flush  3 mL Intravenous Q12H   Sofosbuvir-Velpatasvir  1 tablet Oral Daily   vortioxetine HBr  20 mg Oral QHS   Continuous Infusions:  methocarbamol (ROBAXIN) IV     vancomycin 1,000 mg (08/25/22 0927)     LOS: 6 days    Time spent: 50 minutes spent on chart review, discussion with nursing staff, consultants, updating family and interview/physical exam; more than 50% of that time was spent in counseling and/or coordination of care.    Lachanda Buczek J [place] DO [facility] Available via Epic secure chat 7am-7pm After these hours, please refer to coverage provider listed on [email or web address] 08/25/2022, 12:14 PM

## 2022-08-26 ENCOUNTER — Other Ambulatory Visit (HOSPITAL_COMMUNITY): Payer: Self-pay

## 2022-08-26 DIAGNOSIS — B182 Chronic viral hepatitis C: Secondary | ICD-10-CM | POA: Diagnosis not present

## 2022-08-26 DIAGNOSIS — F411 Generalized anxiety disorder: Secondary | ICD-10-CM | POA: Diagnosis not present

## 2022-08-26 DIAGNOSIS — L03115 Cellulitis of right lower limb: Secondary | ICD-10-CM | POA: Diagnosis not present

## 2022-08-26 DIAGNOSIS — J45909 Unspecified asthma, uncomplicated: Secondary | ICD-10-CM | POA: Diagnosis not present

## 2022-08-26 DIAGNOSIS — M00061 Staphylococcal arthritis, right knee: Secondary | ICD-10-CM | POA: Diagnosis not present

## 2022-08-26 MED ORDER — LINEZOLID 600 MG PO TABS
600.0000 mg | ORAL_TABLET | Freq: Two times a day (BID) | ORAL | Status: DC
  Administered 2022-08-26: 600 mg via ORAL
  Filled 2022-08-26 (×2): qty 1

## 2022-08-26 MED ORDER — BUSPIRONE HCL 15 MG PO TABS: 15.0000 mg | ORAL_TABLET | Freq: Three times a day (TID) | ORAL | 0 refills | Status: AC

## 2022-08-26 MED ORDER — OXYCODONE HCL 5 MG PO TABS: 10.0000 mg | ORAL_TABLET | Freq: Four times a day (QID) | ORAL | 0 refills | Status: AC | PRN

## 2022-08-26 MED ORDER — DOXYCYCLINE HYCLATE 100 MG PO CAPS
100.0000 mg | ORAL_CAPSULE | Freq: Two times a day (BID) | ORAL | 0 refills | Status: AC
  Filled 2022-08-26: qty 28, 14d supply, fill #0

## 2022-08-26 MED ORDER — LINEZOLID 600 MG PO TABS
600.0000 mg | ORAL_TABLET | Freq: Two times a day (BID) | ORAL | 0 refills | Status: AC
  Filled 2022-08-26: qty 50, 25d supply, fill #0

## 2022-08-26 MED ORDER — LORAZEPAM 1 MG PO TABS: 1.0000 mg | ORAL_TABLET | Freq: Four times a day (QID) | ORAL | 0 refills | Status: AC | PRN

## 2022-08-26 NOTE — Progress Notes (Addendum)
Chaplain received a page that Claire [name] was requesting support.  Chaplain spoke with Claire [name] briefly and Claire [name] decided to speak instead tomorrow as she was in the middle of something. Please page if needs arise.   [address], [place] Pager, [phone]

## 2022-08-26 NOTE — Progress Notes (Signed)
Subjective:  Multiple complaints about when IV access was attempted last night and inadequate pain control.  Is a very nervous to be going home if she is going home today  Antibiotics:  Anti-infectives (From admission, onward)    Start     Dose/Rate Route Frequency Ordered Stop   09/23/22 0000  doxycycline (VIBRAMYCIN) 100 MG capsule        100 mg Oral 2 times daily 08/26/22 0804 10/07/22 2359   08/26/22 1000  linezolid (ZYVOX) tablet 600 mg        600 mg Oral Every 12 hours 08/26/22 0800     08/26/22 0000  linezolid (ZYVOX) 600 MG tablet        600 mg Oral 2 times daily 08/26/22 0804 09/20/22 2359   08/24/22 1000  vancomycin (VANCOCIN) IVPB 1000 mg/200 mL premix  Status:  Discontinued        1,000 mg 200 mL/hr over 60 Minutes Intravenous Every 12 hours 08/23/22 2139 08/26/22 0800   08/24/22 0600  ceFAZolin (ANCEF) IVPB 2g/100 mL premix        2 g 200 mL/hr over 30 Minutes Intravenous On call to O.R. 08/23/22 1356 08/24/22 0630   08/23/22 2200  ceFAZolin (ANCEF) IVPB 1 g/50 mL premix        1 g 100 mL/hr over 30 Minutes Intravenous Every 8 hours 08/23/22 1647 08/25/22 0700   08/23/22 1600  vancomycin (VANCOCIN) powder  Status:  Discontinued          As needed 08/23/22 1605 08/23/22 1651   08/23/22 1338  ceFAZolin (ANCEF) 2-4 GM/100ML-% IVPB       Note to Pharmacy: [name], [name]: cabinet override      08/23/22 1338 08/23/22 1501   08/21/22 1400  ceFAZolin (ANCEF) IVPB 2g/100 mL premix  Status:  Discontinued        2 g 200 mL/hr over 30 Minutes Intravenous Every 8 hours 08/21/22 1144 08/22/22 1126   08/20/22 2000  Sofosbuvir-Velpatasvir 400-100 MG TABS 1 tablet        1 tablet Oral Daily 08/20/22 1837     08/19/22 1000  Sofosbuvir-Velpatasvir 400-100 MG TABS 1 tablet  Status:  Discontinued        1 tablet Oral Daily 08/18/22 2320 08/20/22 1837   08/19/22 0800  cefTRIAXone (ROCEPHIN) 1 g in sodium chloride 0.9 % 100 mL IVPB  Status:  Discontinued        1 g 200  mL/hr over 30 Minutes Intravenous Every 24 hours 08/18/22 2248 08/18/22 2315   08/19/22 0800  vancomycin (VANCOREADY) IVPB 1500 mg/300 mL  Status:  Discontinued        1,500 mg 150 mL/hr over 120 Minutes Intravenous Every 12 hours 08/18/22 2334 08/23/22 2139   08/18/22 1945  ceFEPIme (MAXIPIME) 2 g in sodium chloride 0.9 % 100 mL IVPB        2 g 200 mL/hr over 30 Minutes Intravenous  Once 08/18/22 1930 08/18/22 2028   08/18/22 1900  vancomycin (VANCOCIN) IVPB 1000 mg/200 mL premix        1,000 mg 200 mL/hr over 60 Minutes Intravenous  Once 08/18/22 1848 08/18/22 2125   08/18/22 1900  ceFEPIme (MAXIPIME) 1 g in sodium chloride 0.9 % 100 mL IVPB  Status:  Discontinued        1 g 200 mL/hr over 30 Minutes Intravenous  Once 08/18/22 1848 08/18/22 1930       Medications: Scheduled Meds:  busPIRone  15 mg Oral TID   ferrous sulfate  325 mg Oral BID WC   lamoTRIgine  50 mg Oral Daily   linezolid  600 mg Oral Q12H   mometasone-formoterol  2 puff Inhalation BID   montelukast  10 mg Oral QHS   nicotine  14 mg Transdermal Daily   pantoprazole  40 mg Oral Daily   pregabalin  225 mg Oral TID   QUEtiapine  50 mg Oral QHS   sodium chloride flush  10-40 mL Intracatheter Q12H   sodium chloride flush  3 mL Intravenous Q12H   Sofosbuvir-Velpatasvir  1 tablet Oral Daily   vortioxetine HBr  20 mg Oral QHS   Continuous Infusions:  methocarbamol (ROBAXIN) IV     PRN Meds:.acetaminophen **OR** acetaminophen, albuterol, diphenhydrAMINE, hydrocortisone cream, ketorolac, loratadine, LORazepam, methocarbamol (ROBAXIN) IV, ondansetron (ZOFRAN) IV, oxyCODONE, polyethylene glycol, sodium chloride flush    Objective: Weight change:   Intake/Output Summary (Last 24 hours) at 08/26/2022 1027 Last data filed at 08/26/2022 0855 Gross per 24 hour  Intake 240 ml  Output 0 ml  Net 240 ml    Blood pressure 131/74, pulse 76, temperature 98.8 F (37.1 C), resp. rate 20, height 5\' 10"  (1.778 m), weight 86.2  kg, SpO2 96 %. Temp:  [98.5 F (36.9 C)-99.1 F (37.3 C)] 98.8 F (37.1 C) (09/28 0440) Pulse Rate:  [76-94] 76 (09/28 0440) Resp:  [18-20] 20 (09/28 0440) BP: (131-135)/(74-99) 131/74 (09/28 0440) SpO2:  [96 %-100 %] 96 % (09/28 0440)  Physical Exam: Physical Exam Constitutional:      General: She is not in acute distress.    Appearance: She is well-developed. She is not diaphoretic.  HENT:     Head: Normocephalic and atraumatic.     Right Ear: External ear normal.     Left Ear: External ear normal.     Mouth/Throat:     Pharynx: No oropharyngeal exudate.  Eyes:     General: No scleral icterus.    Conjunctiva/sclera: Conjunctivae normal.     Pupils: Pupils are equal, round, and reactive to light.  Cardiovascular:     Rate and Rhythm: Normal rate and regular rhythm.  Pulmonary:     Effort: Pulmonary effort is normal. No respiratory distress.     Breath sounds: No wheezing.  Abdominal:     General: There is no distension.     Palpations: Abdomen is soft.  Musculoskeletal:        General: No tenderness. Normal range of motion.  Lymphadenopathy:     Cervical: No cervical adenopathy.  Skin:    General: Skin is warm and dry.     Coloration: Skin is not pale.     Findings: No erythema or rash.  Neurological:     General: No focal deficit present.     Mental Status: She is alert and oriented to person, place, and time.     Motor: No abnormal muscle tone.     Coordination: Coordination normal.  Psychiatric:        Attention and Perception: Attention normal.        Mood and Affect: Mood is anxious and depressed.        Behavior: Behavior normal.        Thought Content: Thought content normal.        Cognition and Memory: Cognition and memory normal.        Judgment: Judgment normal.     Knee bandaged  CBC:    BMET Recent  Labs    08/23/22 1229 08/24/22 1145  NA 137 133*  K 4.4 3.8  CL 108 105  CO2 22 22  GLUCOSE 53* 76  BUN 20 25*  CREATININE 0.82 0.96   CALCIUM 9.3 8.6*      Liver Panel  No results for input(s): "PROT", "ALBUMIN", "AST", "ALT", "ALKPHOS", "BILITOT", "BILIDIR", "IBILI" in the last 72 hours.     Sedimentation Rate No results for input(s): "ESRSEDRATE" in the last 72 hours. C-Reactive Protein No results for input(s): "CRP" in the last 72 hours.  Micro Results: Recent Results (from the past 720 hour(s))  Blood culture (routine x 2)     Status: None   Collection Time: 08/18/22  6:21 PM   Specimen: BLOOD  Result Value Ref Range Status   Specimen Description   Final    BLOOD BLOOD RIGHT HAND Performed at [facility], [address] [place], [place] [address]    Special Requests   Final    Blood Culture adequate volume BOTTLES DRAWN AEROBIC AND ANAEROBIC Performed at [facility], [address] [place], [place] [address]    Culture   Final    NO GROWTH 5 DAYS Performed at [facility], [address], [place], [place] [address]    Report Status 08/24/2022 FINAL  Final  Blood culture (routine x 2)     Status: None   Collection Time: 08/18/22  7:43 PM   Specimen: BLOOD  Result Value Ref Range Status   Specimen Description   Final    BLOOD RIGHT ANTECUBITAL Performed at [facility], [address], [place], [place] [address]    Special Requests   Final    Blood Culture adequate volume BOTTLES DRAWN AEROBIC AND ANAEROBIC Performed at [facility], [address] [place], [place] [address]    Culture   Final    NO GROWTH 5 DAYS Performed at [facility], [address] [place], [place] [address]    Report Status 08/24/2022 FINAL  Final  Resp Panel by RT-PCR (Flu A&B, Covid) Anterior Nasal Swab     Status: None   Collection Time: 08/18/22  7:50 PM   Specimen: Anterior Nasal Swab  Result Value Ref Range Status   SARS Coronavirus 2 by RT PCR NEGATIVE NEGATIVE Final    Comment: (NOTE) SARS-CoV-2 target  nucleic acids are NOT DETECTED.  The SARS-CoV-2 RNA is generally detectable in upper respiratory specimens during the acute phase of infection. The lowest concentration of SARS-CoV-2 viral copies this assay can detect is 138 copies/mL. A negative result does not preclude SARS-Cov-2 infection and should not be used as the sole basis for treatment or other patient management decisions. A negative result may occur with  improper specimen collection/handling, submission of specimen other than nasopharyngeal swab, presence of viral mutation(s) within the areas targeted by this assay, and inadequate number of viral copies(<138 copies/mL). A negative result must be combined with clinical observations, patient history, and epidemiological information. The expected result is Negative.  Fact Sheet for Patients:  BloggerCourse.com  Fact Sheet for Healthcare Providers:  SeriousBroker.it  This test is no t yet approved or cleared by the [place] FDA and  has been authorized for detection and/or diagnosis of SARS-CoV-2 by FDA under an Emergency Use Authorization (EUA). This EUA will remain  in effect (meaning this test can be used) for the duration of the COVID-19 declaration under Section 564(b)(1) of the Act, 21 U.S.C.section 360bbb-3(b)(1), unless  the authorization is terminated  or revoked sooner.       Influenza A by PCR NEGATIVE NEGATIVE Final   Influenza B by PCR NEGATIVE NEGATIVE Final    Comment: (NOTE) The Xpert Xpress SARS-CoV-2/FLU/RSV plus assay is intended as an aid in the diagnosis of influenza from Nasopharyngeal swab specimens and should not be used as a sole basis for treatment. Nasal washings and aspirates are unacceptable for Xpert Xpress SARS-CoV-2/FLU/RSV testing.  Fact Sheet for Patients: BloggerCourse.com  Fact Sheet for Healthcare  Providers: SeriousBroker.it  This test is not yet approved or cleared by the [place] FDA and has been authorized for detection and/or diagnosis of SARS-CoV-2 by FDA under an Emergency Use Authorization (EUA). This EUA will remain in effect (meaning this test can be used) for the duration of the COVID-19 declaration under Section 564(b)(1) of the Act, 21 U.S.C. section 360bbb-3(b)(1), unless the authorization is terminated or revoked.  Performed at [facility], [address], [place], [place] [address]   Body fluid culture w Gram Stain     Status: None   Collection Time: 08/19/22  4:06 PM   Specimen: Synovium; Synovial Fluid  Result Value Ref Range Status   Specimen Description   Final    SYNOVIAL Performed at [facility], [address] [place], [place] [address]    Special Requests RIGHT KNEE  Final   Gram Stain NO WBC SEEN NO ORGANISMS SEEN   Final   Culture   Final    RARE METHICILLIN RESISTANT STAPHYLOCOCCUS AUREUS CRITICAL RESULT CALLED TO, READ BACK BY AND VERIFIED WITH: RN [email or web address].W AT 1229 ON 08/21/2022 BY T.SAAD. Performed at [facility], [address] [place], [place] [address]    Report Status 08/22/2022 FINAL  Final   Organism ID, Bacteria METHICILLIN RESISTANT STAPHYLOCOCCUS AUREUS  Final      Susceptibility   Methicillin resistant staphylococcus aureus - MIC*    CIPROFLOXACIN >=8 RESISTANT Resistant     ERYTHROMYCIN >=8 RESISTANT Resistant     GENTAMICIN <=0.5 SENSITIVE Sensitive     OXACILLIN >=4 RESISTANT Resistant     TETRACYCLINE <=1 SENSITIVE Sensitive     VANCOMYCIN 1 SENSITIVE Sensitive     TRIMETH/SULFA >=320 RESISTANT Resistant     CLINDAMYCIN <=0.25 SENSITIVE Sensitive     RIFAMPIN <=0.5 SENSITIVE Sensitive     Inducible Clindamycin NEGATIVE Sensitive     * RARE METHICILLIN RESISTANT STAPHYLOCOCCUS AUREUS  Anaerobic culture w Gram Stain     Status: None   Collection  Time: 08/19/22  4:07 PM   Specimen: Synovium; Synovial Fluid  Result Value Ref Range Status   Specimen Description   Final    SYNOVIAL Performed at [facility], [address], [place], [place] [address]    Special Requests RIGHT KNEE  Final   Gram Stain NO WBC SEEN NO ORGANISMS SEEN   Final   Culture   Final    NO ANAEROBES ISOLATED Performed at [facility], [address] [place], [place] [address]    Report Status 08/24/2022 FINAL  Final  Aerobic/Anaerobic Culture w Gram Stain (surgical/deep wound)     Status: None (Preliminary result)   Collection Time: 08/23/22  2:58 PM   Specimen: Joint, Other; Body Fluid  Result Value Ref Range Status   Specimen Description   Final    FLUID Performed at [facility], [address] [place], [place] [address]    Special Requests RIGHT  KNEE  Final   Gram Stain NO ORGANISMS SEEN NO WBC SEEN   Final   Culture   Final    NO GROWTH 3 DAYS NO ANAEROBES ISOLATED; CULTURE IN PROGRESS FOR 5 DAYS Performed at [facility], [address] [place], [place] [address]    Report Status PENDING  Incomplete  Aerobic/Anaerobic Culture w Gram Stain (surgical/deep wound)     Status: None (Preliminary result)   Collection Time: 08/23/22  3:21 PM   Specimen: Joint, Other; Body Fluid  Result Value Ref Range Status   Specimen Description   Final    SYNOVIAL Performed at [facility], [address] [place], [place] [address]    Special Requests RIGHT KNEE  Final   Gram Stain   Final    NO ORGANISMS SEEN RARE WBC PRESENT,BOTH PMN AND MONONUCLEAR    Culture   Final    NO GROWTH 3 DAYS NO ANAEROBES ISOLATED; CULTURE IN PROGRESS FOR 5 DAYS Performed at [facility], [address], [place], [place] [address]    Report Status PENDING  Incomplete  Aerobic/Anaerobic Culture w Gram Stain (surgical/deep wound)     Status: None (Preliminary result)   Collection Time: 08/23/22   3:24 PM   Specimen: Synovial, Right Knee; Body Fluid  Result Value Ref Range Status   Specimen Description   Final    TISSUE Performed at [facility], [address] [place], [place] [address]    Special Requests RIGHT KNEE SYNOVIAL DEEP TISSUE 1  Final   Gram Stain NO ORGANISMS SEEN NO WBC SEEN   Final   Culture   Final    NO GROWTH 3 DAYS NO ANAEROBES ISOLATED; CULTURE IN PROGRESS FOR 5 DAYS Performed at [facility], [address] [place], [place] [address]    Report Status PENDING  Incomplete  Aerobic/Anaerobic Culture w Gram Stain (surgical/deep wound)     Status: None (Preliminary result)   Collection Time: 08/23/22  3:26 PM   Specimen: Synovial, Right Knee; Body Fluid  Result Value Ref Range Status   Specimen Description   Final    TISSUE Performed at [facility], [address], [place], [place] [address]    Special Requests RIGHT KNEE SYNOVIAL DEEP TISSUE 2  Final   Gram Stain NO ORGANISMS SEEN NO WBC SEEN   Final   Culture   Final    NO GROWTH 3 DAYS NO ANAEROBES ISOLATED; CULTURE IN PROGRESS FOR 5 DAYS Performed at [facility], [address], [place], [place] [address]    Report Status PENDING  Incomplete  Aerobic/Anaerobic Culture w Gram Stain (surgical/deep wound)     Status: None (Preliminary result)   Collection Time: 08/23/22  3:27 PM   Specimen: Synovial, Right Knee; Body Fluid  Result Value Ref Range Status   Specimen Description   Final    TISSUE Performed at [facility], [address], [place], [place] [address]    Special Requests RIGHT KNEE SYNOVIAL DEEP TISSUE 3  Final   Gram Stain NO ORGANISMS SEEN NO WBC SEEN   Final   Culture   Final    NO GROWTH 3 DAYS NO ANAEROBES ISOLATED; CULTURE IN PROGRESS FOR 5 DAYS Performed at [facility], [address] [place], [place] [address]    Report Status PENDING  Incomplete  Aerobic/Anaerobic Culture w Gram Stain  (surgical/deep wound)     Status:  None (Preliminary result)   Collection Time: 08/23/22  3:38 PM   Specimen: Soft Tissue, Other  Result Value Ref Range Status   Specimen Description   Final    TISSUE Performed at [facility], [address] [place], [place] [address]    Special Requests RIGHT KNEE PREPATELLAR TISSUE  Final   Gram Stain   Final    NO ORGANISMS SEEN NO WBC SEEN Performed at [facility], [address] [place], [place] [address]    Culture   Final    RARE STAPHYLOCOCCUS AUREUS SUSCEPTIBILITIES TO FOLLOW CRITICAL RESULT CALLED TO, READ BACK BY AND VERIFIED WITH: RN NATE WOODY [id number] AT 1017 BY EC NO ANAEROBES ISOLATED; CULTURE IN PROGRESS FOR 5 DAYS    Report Status PENDING  Incomplete    Studies/Results: No results found.    Assessment/Plan:  INTERVAL HISTORY: Of the operative cultures is growing rare Staphylococcus aureus  She is sp surgery Principal Problem:   Septic arthritis of knee, right (HCC) Active Problems:   Essential hypertension   Asthma, chronic   GERD   Severe recurrent major depression without psychotic features (HCC)   Cellulitis   GAD (generalized anxiety disorder)   IVDU (intravenous drug user)   Chronic hepatitis C without hepatic coma (HCC)   Cellulitis of knee, right    ZAYLEE CORNIA is a 36 y.o. 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 S/p aspiration by ortho with only 513 WBC, no purulence  Her  fluid culture came back positive for methicillin resistant Staphylococcus aureus.  Despite her joint fluid not showing large amounts of inflammation we felt it would be prudent for her in the context of her deep pain in the knee and limited mobility and positive culture for her to proceed with surgery with  orthopedics which was done yesterday.  Dr Odis Hollingshead took the patient to the operating room yesterday and noted frank purulence under an eschar in her wound.  This was cultured.  The eschar was found to communicate with a sinus tract down to the prepatellar bursa the sinus tract was excised and the prepatellar bursa was excised and sent for pathology and microbiology cultures.  He then had further I&D with exploration of the joint with a was no evidence of synovitis or frank purulence but deep cultures were taken and arthrotomy performed with irrigation of the knee.    Intraoperative cultures are incubating 1 days yielding Staphylococcus aureus   She now longer has IV access and can be changed over to zyvox po  We have delivered bag with 4 week of Zyvox and 2-week supply of doxycycline and I have placed it in the bag that has her belongings   Her weaning off the BuSpar to reduce risk of serotonin syndrome  She has hospital follow-up with me on October 16 as listed below:  CYRIAH CHILDREY has an appointment on 09/13/2022  at [facility] with Dr. Daiva Eves at:  [facility] for Infectious Disease, which  is located in the [facility] at  [address] in [place].  [address], which is located to the left of the elevators.  Phone: [phone]  Fax: [phone]  [email or web address]  The patient should arrive 30 minutes prior to their appoitment.   Hepatitis C: Checking a hepatitis  C RNA with her morning labs tomorrow.  She will continue her Dorita Fray but her insurance company requires that SHE HERSELF call the [facility] pharmacy to ask them to ship the next months's supply when it is due  I am hoping to have HCV RNA drawn prior to DC   Depression anxiety: Above discussion about need to wean BuSpar  Pain: she is very concerned about pain control  IVDU: claims to have used methamphetamine but not opiates, she says she has been clean  as mentinoned, I cautioned about DDI between zyvox and methamphetamines  I spent 52 minutes with the patient including than 50% of the time in face to face counseling of the patient regarding her MRSA septic joint, chronic hepatitis C, history of IV drug use along with review of medical records in preparation for the visit and during the visit and in coordination of his care.  I will sign off for now  Please call with further questions.   LOS: 7 days   Acey Lav 08/26/2022, 10:27 AM

## 2022-08-26 NOTE — TOC Transition Note (Signed)
Transition of Care [facility]) - CM/SW Discharge Note   Patient Details  Name: Claire [name] MRN: [id number] Date of Birth: August 26, 1986  [facility] CM/SW Contact:  Vassie Moselle, LCSW Phone Number: 08/26/2022, 2:00 PM   Clinical Narrative:    Met with pt and confirmed need for home health services. Pt agreeable to having home health services arranged for PT and RN for woc.  Pt expressed concerns with discharge plans. MD notified.  HHRN/PT has been arranged through [place].    Final next level of care: Home w Home Health Services Barriers to Discharge: No Barriers Identified   Patient Goals and CMS Choice Patient states their goals for this hospitalization and ongoing recovery are:: To return home   Choice offered to / list presented to : Patient  Discharge Placement                       Discharge Plan and Services                DME Arranged: N/A DME Agency: NA       HH Arranged: RN, PT HH Agency: [place] Date [facility] Agency Contacted: 08/26/22 Time HH Agency Contacted: 1400 Representative spoke with at [place]: Amy  Social Determinants of Health ([place]) Interventions     Readmission Risk Interventions    08/26/2022    1:59 PM 08/20/2022   10:31 AM  Readmission Risk Prevention Plan  Transportation Screening Complete Complete  PCP or Specialist Appt within 5-7 Days  Complete  PCP or Specialist Appt within 3-5 Days Complete   Home Care Screening  Complete  Medication Review (RN CM)  Complete  HRI or Home Care Consult Complete   Social Work Consult for [place] Planning/Counseling Complete   Palliative Care Screening Not Applicable   Medication Review Press photographer) Complete

## 2022-08-26 NOTE — Significant Event (Signed)
Rapid Response Event Note   Requested to attempt IV placement as current IV site not patent; multiple re-attempts at placement have been unsuccessful due to patient agitation. Therefore, patient has been unable to receive scheduled dose of  IV Vancomycin. Patient lethargic and falling asleep during this time. Patient is protecting airway and responsive to loud verbal and tactile stimuli. Per MAR documentation, Ativan, Seroquel, and Lyrica given as prescribed three hours prior. Patient verbally consented to re-attempt at IV placement, but minimally interacted with Probation officer. Only able to palpate one viable vein on right antecubital superior to previous IV site. No flash of blood seen. Withdrew IV and advised bedside RN that either IV team will need to re-attempt with ultrasound as before or patient will go without IV. He will inform provider in light of missed Vancomycin dose due to loss of IV access.   Selinda Michaels, RN

## 2022-08-26 NOTE — Progress Notes (Signed)
IV nurse unsuccessful inserting peripheral iv secondary to patient non-compliance. Patient became angry/hostile, throwing probe at iv nurse during procedure. Notified AC of noted behavior.  Scheduled iv Vancomycin not a administered, O call notified.

## 2022-08-26 NOTE — Progress Notes (Signed)
Chaplain engage in an initial visit with Claire [name], honoring referral from [employer] who saw her yesterday.  Claire [name] expressed that she was really tired because she had not been able to sleep last night.  She voiced not feeling well and needing to rest.  She desires for Chaplain to come back later.   Chaplain offered a supportive presence and let Claire [name] know Chaplain's are available and can come back later.    08/26/22 1100  Clinical Encounter Type  Visited With Patient  Visit Type Initial  Referral From Chaplain  Consult/Referral To Chaplain  Stress Factors  Patient Stress Factors Exhausted

## 2022-08-26 NOTE — Discharge Summary (Signed)
Physician Discharge Summary  Claire [name] XQJ:[id number] DOB: 03-09-1986 DOA: 08/18/2022  PCP: Algis Greenhouse, MD  Admit date: 08/18/2022 Discharge date: 08/26/2022  Admitted From: Home Disposition: Home   Recommendations for Outpatient Follow-up:  Follow up with PCP in 1-2 weeks Follow-up with orthopedics, Dr. Kathaleen Bury in 1 week for wound VAC removal Follow-up with infectious disease, Dr. Drucilla Schmidt  Home Health: RN/PT Equipment/Devices: Wound VAC  Discharge Condition: Stable CODE STATUS: Full code Diet recommendation: Clear diet  History of present illness:  Claire [name] is a 36 y.o. female with past medical history significant for polysubstance/IV drug abuse, discitis/osteomyelitis, anxiety/depression, asthma, hepatitis C, history of necrotizing fasciitis right upper extremity who presented to [facility] on 9/20 with complaints of right knee pain/swelling.  Patient reports she fell approximately 5 days prior to admission suffering a cut to her right knee.  Since she has had worsening pain and swelling since.  Denied fever.   In the ED, patient afebrile.  WBC 11.4, hemoglobin 14.2, platelets 214.  Sodium 136, potassium 4.2, chloride 107, CO2 22, glucose 124, BUN 14, creatinine 0.73.  AST 21, ALT 42, total bilirubin 0.7.  INR 1.1.  Urinalysis unrevealing.  Chest x-ray with no acute findings.  Right knee x-ray with soft tissue edema without acute osseous abnormality, no soft tissue gas.  Patient was started on IV antibiotics.  Patient was transferred to [facility] for further evaluation and management under the hospital service.  Infectious disease and orthopedics was consulted.  ID recommended incision and drainage which was performed by Dr. Cornelius Moras on 9/25.  Hospital course:  Septic arthritis, MRSA; right knee Right knee cellulitis Patient presented to ED with progressive right knee pain/swelling.  Reports fall 5 days prior to admission.  On ED  evaluation patient was afebrile with mild leukocytosis of 11.4.  Underwent right knee aspiration by orthopedics Dr. Kathaleen Bury on 9/21. Infectious disease was consulted who recommended I&D by orthopedics.  Patient underwent right knee arthrotomy with irrigation debridement, right prepatellar bursectomy and sinus tract excision by Dr. Kathaleen Bury 9/25.  Infectious disease was also consulted and followed during hospital course.  Culture from right knee synovial fluid on 9/21 positive for MRSA.  Blood cultures x2 with no growth x5 days.  Patient was treated with vancomycin and will continue antibiotics with Zyvox followed by doxycycline outpatient in accordance with infectious disease recommendations.  We will continue wound VAC until postop follow-up with orthopedics with transition to Aquacel dressing. Outpatient follow-up with orthopedics 7-10 days for wound check and sutures out 2-3 weeks\\.  Outpatient follow-up with infectious disease.   Asthma Continue Singulair 10 mg p.o. nightly, Dulera 2 puffs twice daily (substituted for home Symbicort), Albuterol neb every 6 hours as needed wheezing/shortness of breath   Anxiety/depression Continue Seroquel 50 mg p.o. daily, Lamictal 50 mg p.o. daily, Trintellix 20mg  PO daily; weaning off of BuSpar while on Zyvox.   Hepatitis C Follows in outpatient infectious disease clinic with Dr. Drucilla Schmidt. Continue Epclusa   GERD Continue proton pump inhibitor.   Hx substance abuse/IVDA Reportedly abstinent x7 months.  Continue to encourage complete cessation.  Discharge Diagnoses:  Principal Problem:   Septic arthritis of knee, right (HCC) Active Problems:   Essential hypertension   Asthma, chronic   GERD   Severe recurrent major depression without psychotic features ([place])   Cellulitis   GAD (generalized anxiety disorder)   IVDU (intravenous drug user)   Chronic hepatitis C without hepatic coma (HCC)   Cellulitis  of knee, right    Discharge  Instructions  Discharge Instructions     Call MD for:  difficulty breathing, headache or visual disturbances   Complete by: As directed    Call MD for:  extreme fatigue   Complete by: As directed    Call MD for:  persistant dizziness or light-headedness   Complete by: As directed    Call MD for:  persistant nausea and vomiting   Complete by: As directed    Call MD for:  redness, tenderness, or signs of infection (pain, swelling, redness, odor or green/yellow discharge around incision site)   Complete by: As directed    Call MD for:  severe uncontrolled pain   Complete by: As directed    Diet - low sodium heart healthy   Complete by: As directed    Discharge wound care:   Complete by: As directed    Needs to make an appointment with orthopedics, Dr. Odis Hollingshead for wound VAC removal in 1 week   Increase activity slowly   Complete by: As directed       Allergies as of 08/26/2022       Reactions   Gadolinium Derivatives Anaphylaxis, Rash, Other (See Comments)   Can be tolerated if pre-medicated   Morphine And Related Itching, Other (See Comments)   "If given with benadryl, it's fine"        Medication List     TAKE these medications    albuterol 108 (90 Base) MCG/ACT inhaler Commonly known as: VENTOLIN HFA Inhale 2 puffs into the lungs every 6 (six) hours as needed for wheezing or shortness of breath.   budesonide-formoterol 160-4.5 MCG/ACT inhaler Commonly known as: SYMBICORT Inhale 2 puffs into the lungs 2 (two) times daily as needed ("for flares").   busPIRone 15 MG tablet Commonly known as: BUSPAR Take 1 tablet (15 mg total) by mouth 3 (three) times daily for 3 days. Stop medication after next three days while on zyvox antibiotic What changed:  medication strength how much to take when to take this additional instructions   CALCIUM + D3 PO Take 1 tablet by mouth daily.   cyanocobalamin 1000 MCG/ML injection Commonly known as: VITAMIN B12 Inject 100 mcg  into the muscle every 30 (thirty) days.   diphenhydrAMINE 25 MG tablet Commonly known as: BENADRYL Take 25 mg by mouth every 6 (six) hours as needed for itching.   doxycycline 100 MG capsule Commonly known as: Vibramycin Take 1 capsule (100 mg total) by mouth 2 (two) times daily for 14 days. Start taking once Linezolid completed Start taking on: September 23, 2022   dronabinol 5 MG capsule Commonly known as: MARINOL Take 5 mg by mouth 2 (two) times daily as needed (for nausea).   Epclusa 400-100 MG Tabs Generic drug: Sofosbuvir-Velpatasvir Take 1 tablet by mouth daily.   ferrous sulfate 325 (65 FE) MG EC tablet Take 325 mg by mouth 2 (two) times daily with a meal.   ibuprofen 200 MG tablet Commonly known as: ADVIL Take 1,000 mg by mouth 3 (three) times daily.   lamoTRIgine 25 MG tablet Commonly known as: LAMICTAL Take 50 mg by mouth daily.   lidocaine 5 % Commonly known as: LIDODERM Place 3 patches onto the skin daily. Remove & Discard patch within 12 hours or as directed by MD   linezolid 600 MG tablet Commonly known as: ZYVOX Take 1 tablet (600 mg total) by mouth 2 (two) times daily for 25 days.   montelukast 10  MG tablet Commonly known as: SINGULAIR Take 10 mg by mouth at bedtime.   multivitamin tablet Take 1 tablet by mouth daily with breakfast.   omeprazole 20 MG capsule Commonly known as: PRILOSEC Take 20 mg by mouth daily before breakfast. What changed: Another medication with the same name was removed. Continue taking this medication, and follow the directions you see here.   oxyCODONE 5 MG immediate release tablet Commonly known as: Oxy IR/ROXICODONE Take 2-3 tablets (10-15 mg total) by mouth every 6 (six) hours as needed for up to 7 days for moderate pain. What changed:  how much to take reasons to take this   prazosin 1 MG capsule Commonly known as: MINIPRESS Take 1 mg by mouth at bedtime.   pregabalin 225 MG capsule Commonly known as:  LYRICA Take 225 mg by mouth 3 (three) times daily.   promethazine 25 MG tablet Commonly known as: PHENERGAN Take 25 mg by mouth every 6 (six) hours as needed for nausea or vomiting.   QUEtiapine 50 MG tablet Commonly known as: SEROQUEL Take 50 mg by mouth at bedtime.   rizatriptan 10 MG tablet Commonly known as: MAXALT Take 10 mg by mouth as needed for migraine.   tiZANidine 4 MG capsule Commonly known as: ZANAFLEX Take 1 capsule (4 mg total) by mouth 3 (three) times daily as needed for muscle spasms. What changed: when to take this   Trintellix 20 MG Tabs tablet Generic drug: vortioxetine HBr Take 20 mg by mouth at bedtime. What changed: Another medication with the same name was removed. Continue taking this medication, and follow the directions you see here.   VITAMIN C PO Take 1 tablet by mouth daily.   Vitamin D3 50 MCG (2000 UT) Tabs Take 2,000 Units by mouth daily.               Discharge Care Instructions  (From admission, onward)           Start     Ordered   08/26/22 0000  Discharge wound care:       Comments: Needs to make an appointment with orthopedics, Dr. Odis Hollingshead for wound VAC removal in 1 week   08/26/22 0945            Follow-up Information     Dough, Doris Cheadle, MD Follow up in 1 week(s).   Specialty: Family Medicine Contact information: [address] [place] [address] [phone]         Daiva Eves, Lisette Grinder, MD. Schedule an appointment as soon as possible for a visit.   Specialty: Infectious Diseases Contact information: [address] [place] [address] [phone]         [name], MD. Schedule an appointment as soon as possible for a visit in 1 week(s).   Specialty: Orthopedic Surgery Contact information: [address] [place] [address] [phone]                Allergies  Allergen Reactions   Gadolinium Derivatives Anaphylaxis, Rash and Other (See  Comments)    Can be tolerated if pre-medicated   Morphine And Related Itching and Other (See Comments)    "If given with benadryl, it's fine"    Consultations: Infectious disease, Dr. Algis Liming, Dr. Manson Passey, Dr. [facility], Dr. Odis Hollingshead   Procedures/Studies: MR KNEE RIGHT WO CONTRAST  Result Date: 08/19/2022 CLINICAL DATA:  Septic arthritis suspected. EXAM: MRI OF THE RIGHT KNEE WITHOUT CONTRAST TECHNIQUE: Multiplanar, multisequence MR imaging of  the right was performed. No intravenous contrast was administered. COMPARISON:  Radiographs dated August 18, 2022 FINDINGS: MENISCI Medial: Intact. Lateral: Intact. LIGAMENTS Cruciates: ACL and PCL are intact. Collaterals: Medial collateral ligament is intact. Lateral collateral ligament complex is intact. CARTILAGE Patellofemoral: Deep fissuring at the patellar apex without evidence of full-thickness defect. Medial:  No chondral defect. Lateral:  No chondral defect. JOINT: Trace joint effusion. Normal Hoffa's fat-pad. No plical thickening. POPLITEAL FOSSA: Popliteus tendon is intact. No Baker's cyst. EXTENSOR MECHANISM: Intact quadriceps tendon. Patellar tendinopathy without tear. Intact lateral patellar retinaculum. Intact medial patellar retinaculum. Intact MPFL. BONES: No aggressive osseous lesion. Small enchondroma in the medial femoral condyle. No fracture or dislocation. Other: Skin thickening and subcutaneous soft tissue edema about anterior aspect of the distal thigh and knee. No drainable fluid collection or hematoma. Muscles are normal. IMPRESSION: 1. Marked skin thickening and subcutaneous soft tissue edema about the anterior aspect of the distal thigh and knee. No drainable fluid collection or hematoma. 2.  No MR evidence of septic arthritis. 3. Mild patellar tendinopathy, tendons and ligaments are otherwise intact. Articular cartilage is preserved. No evidence of meniscal injury. 4.  Trace knee joint effusion. Electronically Signed    By: Larose Hires D.O.   On: 08/19/2022 17:35   DG Knee Complete 4 Views Right  Result Date: 08/18/2022 CLINICAL DATA:  Questionable sepsis - evaluate for abnormality Patient reports fall and possible infection. EXAM: RIGHT KNEE - COMPLETE 4+ VIEW COMPARISON:  None Available. FINDINGS: No evidence of fracture, dislocation, or joint effusion. No evidence of arthropathy or other focal bone abnormality. No erosion or periostitis. Generalized anterior and lateral soft tissue edema. No soft tissue gas. No radiopaque foreign body. IMPRESSION: Soft tissue edema without acute osseous abnormality. No soft tissue gas. Electronically Signed   By: Narda Rutherford M.D.   On: 08/18/2022 19:38   DG Chest Port 1 View  Result Date: 08/18/2022 CLINICAL DATA:  Questionable sepsis - evaluate for abnormality EXAM: PORTABLE CHEST 1 VIEW COMPARISON:  Radiograph 04/26/2022 FINDINGS: The cardiomediastinal contours are normal. The lungs are clear. Pulmonary vasculature is normal. No consolidation, pleural effusion, or pneumothorax. No acute osseous abnormalities are seen. Multiple overlying monitoring devices. IMPRESSION: No acute chest findings. Electronically Signed   By: Narda Rutherford M.D.   On: 08/18/2022 19:37     Subjective: Patient seen examined bedside, resting comfortably.  Sleeping.  Family present in room.  Nursing staff reported that patient and other members at bedside with odd behaviors overnight.  Patient starting Zyvox today and will be discharging home.  Encourage patient to make follow-up appointments with both orthopedics and ID for follow-up.  No other questions or concerns at this time.  Denies headache, no chest pain, no shortness of breath, no abdominal pain.  No other reported events overnight per nursing staff.  Discharge Exam: Vitals:   08/25/22 2032 08/26/22 0440  BP: 135/84 131/74  Pulse: 84 76  Resp: 18 20  Temp: 99.1 F (37.3 C) 98.8 F (37.1 C)  SpO2: 99% 96%   Vitals:   08/25/22  1400 08/25/22 2001 08/25/22 2032 08/26/22 0440  BP: (!) 134/99  135/84 131/74  Pulse: 94  84 76  Resp:   18 20  Temp: 98.5 F (36.9 C)  99.1 F (37.3 C) 98.8 F (37.1 C)  TempSrc:      SpO2: 100% 99% 99% 96%  Weight:      Height:        Physical Exam: GEN: NAD,  alert and oriented x 3, chronically ill in appearance, appears older than stated age HEENT: NCAT, PERRL, EOMI, sclera clear, MMM PULM: CTAB w/o wheezes/crackles, normal respiratory effort, on room air CV: RRR w/o M/G/R GI: abd soft, NTND, NABS, no R/G/M MSK: no peripheral edema, moves all EXTR independently, noted postsurgical changes right upper extremity, right knee with wound VAC in place NEURO: CN II-XII intact, no focal deficits, sensation to light touch intact PSYCH: Depressed mood, flat affect. Integumentary: Chronic postoperative skin changes noted to right upper extremity, right knee with wound VAC in place with no concerning fluctuance/erythema, no other concerning rashes/lesions/wounds noted on exposed skin surfaces.    The results of significant diagnostics from this hospitalization (including imaging, microbiology, ancillary and laboratory) are listed below for reference.     Microbiology: Recent Results (from the past 240 hour(s))  Blood culture (routine x 2)     Status: None   Collection Time: 08/18/22  6:21 PM   Specimen: BLOOD  Result Value Ref Range Status   Specimen Description   Final    BLOOD BLOOD RIGHT HAND Performed at [facility], [address], [place], [place] [address]    Special Requests   Final    Blood Culture adequate volume BOTTLES DRAWN AEROBIC AND ANAEROBIC Performed at [facility], [address] [place], [place] [address]    Culture   Final    NO GROWTH 5 DAYS Performed at [facility], [address], [place], [place] [address]    Report Status 08/24/2022 FINAL  Final  Blood culture (routine x 2)     Status: None    Collection Time: 08/18/22  7:43 PM   Specimen: BLOOD  Result Value Ref Range Status   Specimen Description   Final    BLOOD RIGHT ANTECUBITAL Performed at [facility], [address] [place], [place] [address]    Special Requests   Final    Blood Culture adequate volume BOTTLES DRAWN AEROBIC AND ANAEROBIC Performed at [facility], [address], [place], [place] [address]    Culture   Final    NO GROWTH 5 DAYS Performed at [facility], [address], [place], [place] [address]    Report Status 08/24/2022 FINAL  Final  Resp Panel by RT-PCR (Flu A&B, Covid) Anterior Nasal Swab     Status: None   Collection Time: 08/18/22  7:50 PM   Specimen: Anterior Nasal Swab  Result Value Ref Range Status   SARS Coronavirus 2 by RT PCR NEGATIVE NEGATIVE Final    Comment: (NOTE) SARS-CoV-2 target nucleic acids are NOT DETECTED.  The SARS-CoV-2 RNA is generally detectable in upper respiratory specimens during the acute phase of infection. The lowest concentration of SARS-CoV-2 viral copies this assay can detect is 138 copies/mL. A negative result does not preclude SARS-Cov-2 infection and should not be used as the sole basis for treatment or other patient management decisions. A negative result may occur with  improper specimen collection/handling, submission of specimen other than nasopharyngeal swab, presence of viral mutation(s) within the areas targeted by this assay, and inadequate number of viral copies(<138 copies/mL). A negative result must be combined with clinical observations, patient history, and epidemiological information. The expected result is Negative.  Fact Sheet for Patients:  BloggerCourse.com  Fact Sheet for Healthcare Providers:  SeriousBroker.it  This test is no t yet approved or cleared by the [place] FDA and  has been authorized for detection and/or diagnosis  of SARS-CoV-2 by FDA under an Emergency Use Authorization (EUA). This EUA will remain  in effect (meaning this test can be used) for the duration of the COVID-19 declaration under Section 564(b)(1) of the Act, 21 U.S.C.section 360bbb-3(b)(1), unless the authorization is terminated  or revoked sooner.       Influenza A by PCR NEGATIVE NEGATIVE Final   Influenza B by PCR NEGATIVE NEGATIVE Final    Comment: (NOTE) The Xpert Xpress SARS-CoV-2/FLU/RSV plus assay is intended as an aid in the diagnosis of influenza from Nasopharyngeal swab specimens and should not be used as a sole basis for treatment. Nasal washings and aspirates are unacceptable for Xpert Xpress SARS-CoV-2/FLU/RSV testing.  Fact Sheet for Patients: BloggerCourse.com  Fact Sheet for Healthcare Providers: SeriousBroker.it  This test is not yet approved or cleared by the [place] FDA and has been authorized for detection and/or diagnosis of SARS-CoV-2 by FDA under an Emergency Use Authorization (EUA). This EUA will remain in effect (meaning this test can be used) for the duration of the COVID-19 declaration under Section 564(b)(1) of the Act, 21 U.S.C. section 360bbb-3(b)(1), unless the authorization is terminated or revoked.  Performed at [facility], [address] [place], [place] [address]   Body fluid culture w Gram Stain     Status: None   Collection Time: 08/19/22  4:06 PM   Specimen: Synovium; Synovial Fluid  Result Value Ref Range Status   Specimen Description   Final    SYNOVIAL Performed at [facility], [address], [place], [place] [address]    Special Requests RIGHT KNEE  Final   Gram Stain NO WBC SEEN NO ORGANISMS SEEN   Final   Culture   Final    RARE METHICILLIN RESISTANT STAPHYLOCOCCUS AUREUS CRITICAL RESULT CALLED TO, READ BACK BY AND VERIFIED WITH: RN [email or web address] AT 1229 ON 08/21/2022 BY  T.SAAD. Performed at [facility], [address], [place], [place] [address]    Report Status 08/22/2022 FINAL  Final   Organism ID, Bacteria METHICILLIN RESISTANT STAPHYLOCOCCUS AUREUS  Final      Susceptibility   Methicillin resistant staphylococcus aureus - MIC*    CIPROFLOXACIN >=8 RESISTANT Resistant     ERYTHROMYCIN >=8 RESISTANT Resistant     GENTAMICIN <=0.5 SENSITIVE Sensitive     OXACILLIN >=4 RESISTANT Resistant     TETRACYCLINE <=1 SENSITIVE Sensitive     VANCOMYCIN 1 SENSITIVE Sensitive     TRIMETH/SULFA >=320 RESISTANT Resistant     CLINDAMYCIN <=0.25 SENSITIVE Sensitive     RIFAMPIN <=0.5 SENSITIVE Sensitive     Inducible Clindamycin NEGATIVE Sensitive     * RARE METHICILLIN RESISTANT STAPHYLOCOCCUS AUREUS  Anaerobic culture w Gram Stain     Status: None   Collection Time: 08/19/22  4:07 PM   Specimen: Synovium; Synovial Fluid  Result Value Ref Range Status   Specimen Description   Final    SYNOVIAL Performed at [facility], [address], [place], [place] [address]    Special Requests RIGHT KNEE  Final   Gram Stain NO WBC SEEN NO ORGANISMS SEEN   Final   Culture   Final    NO ANAEROBES ISOLATED Performed at [facility], [address] [place], [place] [address]    Report Status 08/24/2022 FINAL  Final  Aerobic/Anaerobic Culture w Gram Stain (surgical/deep wound)     Status: None (Preliminary result)   Collection Time: 08/23/22  2:58 PM   Specimen: Joint,  Other; Body Fluid  Result Value Ref Range Status   Specimen Description   Final    FLUID Performed at [facility], [address] [place], [place] [address]    Special Requests RIGHT KNEE  Final   Gram Stain NO ORGANISMS SEEN NO WBC SEEN   Final   Culture   Final    NO GROWTH 3 DAYS NO ANAEROBES ISOLATED; CULTURE IN PROGRESS FOR 5 DAYS Performed at [facility], [address], [place], [place] [address]    Report Status PENDING   Incomplete  Aerobic/Anaerobic Culture w Gram Stain (surgical/deep wound)     Status: None (Preliminary result)   Collection Time: 08/23/22  3:21 PM   Specimen: Joint, Other; Body Fluid  Result Value Ref Range Status   Specimen Description   Final    SYNOVIAL Performed at [facility], [address], [place], [place] [address]    Special Requests RIGHT KNEE  Final   Gram Stain   Final    NO ORGANISMS SEEN RARE WBC PRESENT,BOTH PMN AND MONONUCLEAR    Culture   Final    NO GROWTH 2 DAYS NO ANAEROBES ISOLATED; CULTURE IN PROGRESS FOR 5 DAYS Performed at [facility], [address] [place], [place] [address]    Report Status PENDING  Incomplete  Aerobic/Anaerobic Culture w Gram Stain (surgical/deep wound)     Status: None (Preliminary result)   Collection Time: 08/23/22  3:24 PM   Specimen: Synovial, Right Knee; Body Fluid  Result Value Ref Range Status   Specimen Description   Final    TISSUE Performed at [facility], [address] [place], [place] [address]    Special Requests RIGHT KNEE SYNOVIAL DEEP TISSUE 1  Final   Gram Stain NO ORGANISMS SEEN NO WBC SEEN   Final   Culture   Final    NO GROWTH 3 DAYS NO ANAEROBES ISOLATED; CULTURE IN PROGRESS FOR 5 DAYS Performed at [facility], [address] [place], [place] [address]    Report Status PENDING  Incomplete  Aerobic/Anaerobic Culture w Gram Stain (surgical/deep wound)     Status: None (Preliminary result)   Collection Time: 08/23/22  3:26 PM   Specimen: Synovial, Right Knee; Body Fluid  Result Value Ref Range Status   Specimen Description   Final    TISSUE Performed at [facility], [address], [place], [place] [address]    Special Requests RIGHT KNEE SYNOVIAL DEEP TISSUE 2  Final   Gram Stain NO ORGANISMS SEEN NO WBC SEEN   Final   Culture   Final    NO GROWTH 3 DAYS NO ANAEROBES ISOLATED; CULTURE IN PROGRESS FOR 5 DAYS Performed at  [facility], [address] [place], [place] [address]    Report Status PENDING  Incomplete  Aerobic/Anaerobic Culture w Gram Stain (surgical/deep wound)     Status: None (Preliminary result)   Collection Time: 08/23/22  3:27 PM   Specimen: Synovial, Right Knee; Body Fluid  Result Value Ref Range Status   Specimen Description   Final    TISSUE Performed at [facility] [place] [facility], [address] [place], [place] [address]    Special Requests RIGHT KNEE SYNOVIAL DEEP TISSUE 3  Final   Gram Stain NO ORGANISMS SEEN NO WBC SEEN   Final   Culture   Final    NO GROWTH 3 DAYS NO ANAEROBES ISOLATED; CULTURE  IN PROGRESS FOR 5 DAYS Performed at [facility], [address], [place], [place] [address]    Report Status PENDING  Incomplete  Aerobic/Anaerobic Culture w Gram Stain (surgical/deep wound)     Status: None (Preliminary result)   Collection Time: 08/23/22  3:38 PM   Specimen: Soft Tissue, Other  Result Value Ref Range Status   Specimen Description   Final    TISSUE Performed at [facility], [address] [place], [place] [address]    Special Requests RIGHT KNEE PREPATELLAR TISSUE  Final   Gram Stain   Final    NO ORGANISMS SEEN NO WBC SEEN Performed at [facility], [address] [place], [place] [address]    Culture   Final    RARE STAPHYLOCOCCUS AUREUS SUSCEPTIBILITIES TO FOLLOW CRITICAL RESULT CALLED TO, READ BACK BY AND VERIFIED WITH: RN NATE WOODY [id number] AT 1017 BY EC NO ANAEROBES ISOLATED; CULTURE IN PROGRESS FOR 5 DAYS    Report Status PENDING  Incomplete     Labs: BNP (last 3 results) No results for input(s): "BNP" in the last 8760 hours. Basic Metabolic Panel: Recent Labs  Lab 08/20/22 0525 08/21/22 0605 08/22/22 0538 08/23/22 1229 08/24/22 1145  NA 137 138 137 137 133*  K 4.2 4.2 4.4 4.4 3.8  CL 111 112* 107 108 105  CO2 20* 20* GLUCOSE 90 147* 90 53* 76  BUN 25*  CREATININE  0.63 0.84 0.79 0.82 0.96  CALCIUM 8.6* 8.5* 9.1 9.3 8.6*   Liver Function Tests: No results for input(s): "AST", "ALT", "ALKPHOS", "BILITOT", "PROT", "ALBUMIN" in the last 168 hours. No results for input(s): "LIPASE", "AMYLASE" in the last 168 hours. No results for input(s): "AMMONIA" in the last 168 hours. CBC: Recent Labs  Lab 08/20/22 0606 08/21/22 0605 08/22/22 0538  WBC 5.2 5.3 5.0  HGB 13.8 12.9 14.2  HCT 43.4 40.7 45.7  MCV 95.0 96.2 94.8  PLT 226 267 303   Cardiac Enzymes: No results for input(s): "CKTOTAL", "CKMB", "CKMBINDEX", "TROPONINI" in the last 168 hours. BNP: Invalid input(s): "POCBNP" CBG: Recent Labs  Lab 08/23/22 1837  GLUCAP 126*   D-Dimer No results for input(s): "DDIMER" in the last 72 hours. Hgb A1c No results for input(s): "HGBA1C" in the last 72 hours. Lipid Profile No results for input(s): "CHOL", "HDL", "LDLCALC", "TRIG", "CHOLHDL", "LDLDIRECT" in the last 72 hours. Thyroid function studies No results for input(s): "TSH", "T4TOTAL", "T3FREE", "THYROIDAB" in the last 72 hours.  Invalid input(s): "FREET3" Anemia work up No results for input(s): "VITAMINB12", "FOLATE", "FERRITIN", "TIBC", "IRON", "RETICCTPCT" in the last 72 hours. Urinalysis    Component Value Date/Time   COLORURINE YELLOW 08/18/2022 2023   APPEARANCEUR CLEAR 08/18/2022 2023   LABSPEC 1.029 08/18/2022 2023   PHURINE 6.0 08/18/2022 2023   GLUCOSEU NEGATIVE 08/18/2022 2023   HGBUR NEGATIVE 08/18/2022 2023   BILIRUBINUR NEGATIVE 08/18/2022 2023   KETONESUR NEGATIVE 08/18/2022 2023   PROTEINUR TRACE (A) 08/18/2022 2023   NITRITE NEGATIVE 08/18/2022 2023   LEUKOCYTESUR NEGATIVE 08/18/2022 2023   Sepsis Labs Recent Labs  Lab 08/20/22 0606 08/21/22 0605 08/22/22 0538  WBC 5.2 5.3 5.0   Microbiology Recent Results (from the past 240 hour(s))  Blood culture (routine x 2)     Status: None   Collection Time: 08/18/22  6:21 PM   Specimen: BLOOD  Result Value Ref Range  Status   Specimen Description   Final  BLOOD BLOOD RIGHT HAND Performed at [facility], [address], [place], [place] [address]    Special Requests   Final    Blood Culture adequate volume BOTTLES DRAWN AEROBIC AND ANAEROBIC Performed at [facility], [address], [place], [place] [address]    Culture   Final    NO GROWTH 5 DAYS Performed at [facility], [address] [place], [place] [address]    Report Status 08/24/2022 FINAL  Final  Blood culture (routine x 2)     Status: None   Collection Time: 08/18/22  7:43 PM   Specimen: BLOOD  Result Value Ref Range Status   Specimen Description   Final    BLOOD RIGHT ANTECUBITAL Performed at [facility], [address], [place], [place] [address]    Special Requests   Final    Blood Culture adequate volume BOTTLES DRAWN AEROBIC AND ANAEROBIC Performed at [facility], [address], [place], [place] [address]    Culture   Final    NO GROWTH 5 DAYS Performed at [facility], [address] [place], [place] [address]    Report Status 08/24/2022 FINAL  Final  Resp Panel by RT-PCR (Flu A&B, Covid) Anterior Nasal Swab     Status: None   Collection Time: 08/18/22  7:50 PM   Specimen: Anterior Nasal Swab  Result Value Ref Range Status   SARS Coronavirus 2 by RT PCR NEGATIVE NEGATIVE Final    Comment: (NOTE) SARS-CoV-2 target nucleic acids are NOT DETECTED.  The SARS-CoV-2 RNA is generally detectable in upper respiratory specimens during the acute phase of infection. The lowest concentration of SARS-CoV-2 viral copies this assay can detect is 138 copies/mL. A negative result does not preclude SARS-Cov-2 infection and should not be used as the sole basis for treatment or other patient management decisions. A negative result may occur with  improper specimen collection/handling, submission of specimen other than nasopharyngeal swab,  presence of viral mutation(s) within the areas targeted by this assay, and inadequate number of viral copies(<138 copies/mL). A negative result must be combined with clinical observations, patient history, and epidemiological information. The expected result is Negative.  Fact Sheet for Patients:  BloggerCourse.comhttps://www.fda.gov/media/152166/download  Fact Sheet for Healthcare Providers:  SeriousBroker.ithttps://www.fda.gov/media/152162/download  This test is no t yet approved or cleared by the [place] FDA and  has been authorized for detection and/or diagnosis of SARS-CoV-2 by FDA under an Emergency Use Authorization (EUA). This EUA will remain  in effect (meaning this test can be used) for the duration of the COVID-19 declaration under Section 564(b)(1) of the Act, 21 U.S.C.section 360bbb-3(b)(1), unless the authorization is terminated  or revoked sooner.       Influenza A by PCR NEGATIVE NEGATIVE Final   Influenza B by PCR NEGATIVE NEGATIVE Final    Comment: (NOTE) The Xpert Xpress SARS-CoV-2/FLU/RSV plus assay is intended as an aid in the diagnosis of influenza from Nasopharyngeal swab specimens and should not be used as a sole basis for treatment. Nasal washings and aspirates are unacceptable for Xpert Xpress SARS-CoV-2/FLU/RSV testing.  Fact Sheet for Patients: BloggerCourse.comhttps://www.fda.gov/media/152166/download  Fact Sheet for Healthcare Providers: SeriousBroker.ithttps://www.fda.gov/media/152162/download  This test is not yet approved or cleared by the [place] FDA and has been authorized for detection and/or diagnosis of SARS-CoV-2 by FDA under an Emergency Use Authorization (EUA). This EUA will remain in effect (meaning this test can be used) for the duration of the COVID-19 declaration under Section 564(b)(1)  of the Act, 21 U.S.C. section 360bbb-3(b)(1), unless the authorization is terminated or revoked.  Performed at [facility], [address] [place], [place] [address]   Body  fluid culture w Gram Stain     Status: None   Collection Time: 08/19/22  4:06 PM   Specimen: Synovium; Synovial Fluid  Result Value Ref Range Status   Specimen Description   Final    SYNOVIAL Performed at [facility], [address] [place], [place] [address]    Special Requests RIGHT KNEE  Final   Gram Stain NO WBC SEEN NO ORGANISMS SEEN   Final   Culture   Final    RARE METHICILLIN RESISTANT STAPHYLOCOCCUS AUREUS CRITICAL RESULT CALLED TO, READ BACK BY AND VERIFIED WITH: RN [email or web address] AT 1229 ON 08/21/2022 BY T.SAAD. Performed at [facility], [address], [place], [place] [address]    Report Status 08/22/2022 FINAL  Final   Organism ID, Bacteria METHICILLIN RESISTANT STAPHYLOCOCCUS AUREUS  Final      Susceptibility   Methicillin resistant staphylococcus aureus - MIC*    CIPROFLOXACIN >=8 RESISTANT Resistant     ERYTHROMYCIN >=8 RESISTANT Resistant     GENTAMICIN <=0.5 SENSITIVE Sensitive     OXACILLIN >=4 RESISTANT Resistant     TETRACYCLINE <=1 SENSITIVE Sensitive     VANCOMYCIN 1 SENSITIVE Sensitive     TRIMETH/SULFA >=320 RESISTANT Resistant     CLINDAMYCIN <=0.25 SENSITIVE Sensitive     RIFAMPIN <=0.5 SENSITIVE Sensitive     Inducible Clindamycin NEGATIVE Sensitive     * RARE METHICILLIN RESISTANT STAPHYLOCOCCUS AUREUS  Anaerobic culture w Gram Stain     Status: None   Collection Time: 08/19/22  4:07 PM   Specimen: Synovium; Synovial Fluid  Result Value Ref Range Status   Specimen Description   Final    SYNOVIAL Performed at [facility], [address] [place], [place] [address]    Special Requests RIGHT KNEE  Final   Gram Stain NO WBC SEEN NO ORGANISMS SEEN   Final   Culture   Final    NO ANAEROBES ISOLATED Performed at [facility], [address], [place], [place] [address]    Report Status 08/24/2022 FINAL  Final  Aerobic/Anaerobic Culture w Gram Stain (surgical/deep wound)     Status: None (Preliminary  result)   Collection Time: 08/23/22  2:58 PM   Specimen: Joint, Other; Body Fluid  Result Value Ref Range Status   Specimen Description   Final    FLUID Performed at [facility], [address] [place], [place] [address]    Special Requests RIGHT KNEE  Final   Gram Stain NO ORGANISMS SEEN NO WBC SEEN   Final   Culture   Final    NO GROWTH 3 DAYS NO ANAEROBES ISOLATED; CULTURE IN PROGRESS FOR 5 DAYS Performed at [facility], [address], [place], [place] [address]    Report Status PENDING  Incomplete  Aerobic/Anaerobic Culture w Gram Stain (surgical/deep wound)     Status: None (Preliminary result)   Collection Time: 08/23/22  3:21 PM   Specimen: Joint, Other; Body Fluid  Result Value Ref Range Status   Specimen Description   Final    SYNOVIAL Performed at [facility], [address], [place], [place] [address]    Special Requests RIGHT KNEE  Final   Gram Stain   Final    NO ORGANISMS SEEN RARE WBC  PRESENT,BOTH PMN AND MONONUCLEAR    Culture   Final    NO GROWTH 2 DAYS NO ANAEROBES ISOLATED; CULTURE IN PROGRESS FOR 5 DAYS Performed at [facility], [address], [place], [place] [address]    Report Status PENDING  Incomplete  Aerobic/Anaerobic Culture w Gram Stain (surgical/deep wound)     Status: None (Preliminary result)   Collection Time: 08/23/22  3:24 PM   Specimen: Synovial, Right Knee; Body Fluid  Result Value Ref Range Status   Specimen Description   Final    TISSUE Performed at [facility], [address] [place], [place] [address]    Special Requests RIGHT KNEE SYNOVIAL DEEP TISSUE 1  Final   Gram Stain NO ORGANISMS SEEN NO WBC SEEN   Final   Culture   Final    NO GROWTH 3 DAYS NO ANAEROBES ISOLATED; CULTURE IN PROGRESS FOR 5 DAYS Performed at [facility], [address], [place], [place] [address]    Report Status PENDING  Incomplete  Aerobic/Anaerobic Culture w Gram  Stain (surgical/deep wound)     Status: None (Preliminary result)   Collection Time: 08/23/22  3:26 PM   Specimen: Synovial, Right Knee; Body Fluid  Result Value Ref Range Status   Specimen Description   Final    TISSUE Performed at [facility], [address] [place], [place] [address]    Special Requests RIGHT KNEE SYNOVIAL DEEP TISSUE 2  Final   Gram Stain NO ORGANISMS SEEN NO WBC SEEN   Final   Culture   Final    NO GROWTH 3 DAYS NO ANAEROBES ISOLATED; CULTURE IN PROGRESS FOR 5 DAYS Performed at [facility], [address] [place], [place] [address]    Report Status PENDING  Incomplete  Aerobic/Anaerobic Culture w Gram Stain (surgical/deep wound)     Status: None (Preliminary result)   Collection Time: 08/23/22  3:27 PM   Specimen: Synovial, Right Knee; Body Fluid  Result Value Ref Range Status   Specimen Description   Final    TISSUE Performed at [facility], [address], [place], [place] [address]    Special Requests RIGHT KNEE SYNOVIAL DEEP TISSUE 3  Final   Gram Stain NO ORGANISMS SEEN NO WBC SEEN   Final   Culture   Final    NO GROWTH 3 DAYS NO ANAEROBES ISOLATED; CULTURE IN PROGRESS FOR 5 DAYS Performed at [facility], [address], [place], [place] [address]    Report Status PENDING  Incomplete  Aerobic/Anaerobic Culture w Gram Stain (surgical/deep wound)     Status: None (Preliminary result)   Collection Time: 08/23/22  3:38 PM   Specimen: Soft Tissue, Other  Result Value Ref Range Status   Specimen Description   Final    TISSUE Performed at [facility], [address] [place], [place] [address]    Special Requests RIGHT KNEE PREPATELLAR TISSUE  Final   Gram Stain   Final    NO ORGANISMS SEEN NO WBC SEEN Performed at [facility], [address], [place], [place] [address]    Culture   Final    RARE STAPHYLOCOCCUS AUREUS SUSCEPTIBILITIES TO FOLLOW CRITICAL RESULT  CALLED TO, READ BACK BY AND VERIFIED WITH: RN NATE WOODY [phone] AT 1017 BY EC NO ANAEROBES ISOLATED; CULTURE IN PROGRESS FOR 5 DAYS    Report Status PENDING  Incomplete     Time coordinating  discharge: Over 30 minutes  SIGNED:   Alvira Philips [place], DO  [facility] 08/26/2022, 9:46 AM
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
## 2022-08-27 NOTE — Progress Notes (Signed)
Chaplain met with Claire [name] along with her son.  Chaplain provided listening as she shared about her health journey as well as some of the stressors in her life. She does not feel ready to be discharged and is scared of the infection coming back.  She also does not have a home currently as her parents sold the house she was living in that belonged to them.  She plans to stay with a friend until she can get back on her feet.  Chaplain encouraged her to focus on healing and on doing what she needs to do to help herself take next steps.  Chaplain Katy , Bcc Pager, [phone] 

## 2022-08-28 LAB — AEROBIC/ANAEROBIC CULTURE W GRAM STAIN (SURGICAL/DEEP WOUND)
Culture: NO GROWTH
Culture: NO GROWTH
Culture: NO GROWTH
Culture: NO GROWTH
Culture: NO GROWTH
Gram Stain: NONE SEEN
Gram Stain: NONE SEEN
Gram Stain: NONE SEEN
Gram Stain: NONE SEEN
Gram Stain: NONE SEEN
Gram Stain: NONE SEEN

## 2022-09-02 LAB — HCV RNA QUANT

## 2022-09-13 ENCOUNTER — Encounter: Payer: Self-pay | Admitting: Infectious Disease

## 2022-09-13 ENCOUNTER — Ambulatory Visit (INDEPENDENT_AMBULATORY_CARE_PROVIDER_SITE_OTHER): Payer: 59 | Admitting: Infectious Disease

## 2022-09-13 ENCOUNTER — Other Ambulatory Visit: Payer: Self-pay

## 2022-09-13 VITALS — BP 125/85 | HR 76 | Temp 98.0°F | Ht 70.0 in | Wt 199.0 lb

## 2022-09-13 DIAGNOSIS — B182 Chronic viral hepatitis C: Secondary | ICD-10-CM

## 2022-09-13 DIAGNOSIS — M4620 Osteomyelitis of vertebra, site unspecified: Secondary | ICD-10-CM

## 2022-09-13 DIAGNOSIS — M00061 Staphylococcal arthritis, right knee: Secondary | ICD-10-CM

## 2022-09-13 DIAGNOSIS — M4626 Osteomyelitis of vertebra, lumbar region: Secondary | ICD-10-CM | POA: Diagnosis not present

## 2022-09-13 DIAGNOSIS — M726 Necrotizing fasciitis: Secondary | ICD-10-CM

## 2022-09-13 MED ORDER — FLUCONAZOLE 100 MG PO TABS: 100.0000 mg | ORAL_TABLET | Freq: Every day | ORAL | 1 refills | Status: AC
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
## 2022-09-14 LAB — CBC WITH DIFFERENTIAL/PLATELET
Absolute Monocytes: 271 cells/uL (ref 200–950)
Basophils Absolute: 41 cells/uL (ref 0–200)
Basophils Relative: 1 %
Eosinophils Absolute: 340 cells/uL (ref 15–500)
Eosinophils Relative: 8.3 %
HCT: 37.9 % (ref 35.0–45.0)
Hemoglobin: 12.8 g/dL (ref 11.7–15.5)
Lymphs Abs: 1603 cells/uL (ref 850–3900)
MCH: 31.4 pg (ref 27.0–33.0)
MCHC: 33.8 g/dL (ref 32.0–36.0)
MCV: 92.9 fL (ref 80.0–100.0)
MPV: 11 fL (ref 7.5–12.5)
Monocytes Relative: 6.6 %
Neutro Abs: 1845 cells/uL (ref 1500–7800)
Neutrophils Relative %: 45 %
Platelets: 166 10*3/uL (ref 140–400)
RBC: 4.08 10*6/uL (ref 3.80–5.10)
RDW: 13.4 % (ref 11.0–15.0)
Total Lymphocyte: 39.1 %
WBC: 4.1 10*3/uL (ref 3.8–10.8)

## 2022-09-14 LAB — COMPLETE METABOLIC PANEL WITH GFR
AG Ratio: 1.7 (calc) (ref 1.0–2.5)
ALT: 25 U/L (ref 6–29)
AST: 14 U/L (ref 10–30)
Albumin: 3.9 g/dL (ref 3.6–5.1)
Alkaline phosphatase (APISO): 192 U/L — ABNORMAL HIGH (ref 31–125)
BUN/Creatinine Ratio: 13 (calc) (ref 6–22)
BUN: 14 mg/dL (ref 7–25)
CO2: 21 mmol/L (ref 20–32)
Calcium: 9 mg/dL (ref 8.6–10.2)
Chloride: 109 mmol/L (ref 98–110)
Creat: 1.04 mg/dL — ABNORMAL HIGH (ref 0.50–0.97)
Globulin: 2.3 g/dL (calc) (ref 1.9–3.7)
Glucose, Bld: 84 mg/dL (ref 65–99)
Potassium: 4.8 mmol/L (ref 3.5–5.3)
Sodium: 139 mmol/L (ref 135–146)
Total Bilirubin: 0.5 mg/dL (ref 0.2–1.2)
Total Protein: 6.2 g/dL (ref 6.1–8.1)
eGFR: 71 mL/min/{1.73_m2} (ref >=60)

## 2022-09-14 LAB — SEDIMENTATION RATE: Sed Rate: 6 mm/h (ref 0–20)

## 2022-09-14 LAB — C-REACTIVE PROTEIN: CRP: 3.3 mg/L (ref <8.0)

## 2022-10-27 ENCOUNTER — Ambulatory Visit: Payer: 59 | Admitting: Infectious Disease

## 2022-11-10 ENCOUNTER — Encounter: Payer: Self-pay | Admitting: Infectious Disease

## 2022-11-10 ENCOUNTER — Ambulatory Visit (INDEPENDENT_AMBULATORY_CARE_PROVIDER_SITE_OTHER): Payer: 59 | Admitting: Infectious Disease

## 2022-11-10 ENCOUNTER — Other Ambulatory Visit: Payer: Self-pay

## 2022-11-10 VITALS — BP 126/84 | HR 90 | Temp 97.3°F | Ht 70.0 in | Wt 186.0 lb

## 2022-11-10 DIAGNOSIS — B182 Chronic viral hepatitis C: Secondary | ICD-10-CM | POA: Diagnosis not present

## 2022-11-10 DIAGNOSIS — M4620 Osteomyelitis of vertebra, site unspecified: Secondary | ICD-10-CM

## 2022-11-10 DIAGNOSIS — Z22322 Carrier or suspected carrier of Methicillin resistant Staphylococcus aureus: Secondary | ICD-10-CM

## 2022-11-10 DIAGNOSIS — M00061 Staphylococcal arthritis, right knee: Secondary | ICD-10-CM | POA: Diagnosis not present

## 2022-11-10 HISTORY — DX: Carrier or suspected carrier of methicillin resistant Staphylococcus aureus: Z22.322

## 2022-11-10 MED ORDER — MUPIROCIN 2 % EX OINT: 1.0000 | TOPICAL_OINTMENT | Freq: Two times a day (BID) | CUTANEOUS | 1 refills | Status: AC
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
## 2022-11-12 LAB — CBC WITH DIFFERENTIAL/PLATELET
Absolute Monocytes: 334 cells/uL (ref 200–950)
Basophils Absolute: 38 cells/uL (ref 0–200)
Basophils Relative: 0.6 %
Eosinophils Absolute: 271 cells/uL (ref 15–500)
Eosinophils Relative: 4.3 %
HCT: 40.8 % (ref 35.0–45.0)
Hemoglobin: 13.9 g/dL (ref 11.7–15.5)
Lymphs Abs: 2066 cells/uL (ref 850–3900)
MCH: 31.4 pg (ref 27.0–33.0)
MCHC: 34.1 g/dL (ref 32.0–36.0)
MCV: 92.3 fL (ref 80.0–100.0)
MPV: 11.7 fL (ref 7.5–12.5)
Monocytes Relative: 5.3 %
Neutro Abs: 3591 cells/uL (ref 1500–7800)
Neutrophils Relative %: 57 %
Platelets: 212 10*3/uL (ref 140–400)
RBC: 4.42 10*6/uL (ref 3.80–5.10)
RDW: 12.2 % (ref 11.0–15.0)
Total Lymphocyte: 32.8 %
WBC: 6.3 10*3/uL (ref 3.8–10.8)

## 2022-11-12 LAB — COMPLETE METABOLIC PANEL WITH GFR
AG Ratio: 1.8 (calc) (ref 1.0–2.5)
ALT: 26 U/L (ref 6–29)
AST: 19 U/L (ref 10–30)
Albumin: 4.1 g/dL (ref 3.6–5.1)
Alkaline phosphatase (APISO): 209 U/L — ABNORMAL HIGH (ref 31–125)
BUN: 13 mg/dL (ref 7–25)
CO2: 22 mmol/L (ref 20–32)
Calcium: 9.1 mg/dL (ref 8.6–10.2)
Chloride: 110 mmol/L (ref 98–110)
Creat: 0.96 mg/dL (ref 0.50–0.97)
Globulin: 2.3 g/dL (calc) (ref 1.9–3.7)
Glucose, Bld: 88 mg/dL (ref 65–99)
Potassium: 4.4 mmol/L (ref 3.5–5.3)
Sodium: 139 mmol/L (ref 135–146)
Total Bilirubin: 0.3 mg/dL (ref 0.2–1.2)
Total Protein: 6.4 g/dL (ref 6.1–8.1)
eGFR: 78 mL/min/{1.73_m2} (ref >=60)

## 2022-11-12 LAB — HEPATITIS C RNA QUANTITATIVE
HCV Quantitative Log: <1.18 log IU/mL
HCV RNA, PCR, QN: <15 IU/mL

## 2022-11-12 LAB — SEDIMENTATION RATE: Sed Rate: 2 mm/h (ref 0–20)

## 2022-11-12 LAB — C-REACTIVE PROTEIN: CRP: 0.7 mg/L (ref <8.0)

## 2023-02-05 ENCOUNTER — Ambulatory Visit
Admission: EM | Admit: 2023-02-05 | Discharge: 2023-02-05 | Disposition: A | Discharge status: Home / Self Care | Payer: Medicaid Other

## 2023-02-05 ENCOUNTER — Other Ambulatory Visit: Payer: Self-pay

## 2023-02-05 DIAGNOSIS — R531 Weakness: Secondary | ICD-10-CM | POA: Diagnosis not present

## 2023-02-05 DIAGNOSIS — S6991XA Unspecified injury of right wrist, hand and finger(s), initial encounter: Secondary | ICD-10-CM

## 2023-02-05 DIAGNOSIS — Z8619 Personal history of other infectious and parasitic diseases: Secondary | ICD-10-CM | POA: Diagnosis not present

## 2023-02-05 DIAGNOSIS — S8990XA Unspecified injury of unspecified lower leg, initial encounter: Secondary | ICD-10-CM
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
## 2023-02-05 NOTE — ED Notes (Signed)
Patient is being discharged from the Urgent Care and sent to the Emergency Department via EMS . Per Derenda Fennel, PA, patient is in need of higher level of care due to wanting to rule out sepsis/need for lab work w/ quick turnaround. Patient is aware and verbalizes understanding of plan of care.  Vitals:   02/05/23 1047  BP: 111/74  Pulse: 88  Resp: 20  Temp: 99 F (37.2 C)  SpO2: 97%

## 2023-02-05 NOTE — ED Triage Notes (Addendum)
Pt presents to Urgent Care with swelling and scab to R 5th finger after a laceration 2 weeks ago. Also c/o R foot/toe pain following injury one week ago--slipped on curb and fell, also skinning R knee and R elbow. Pt also states she is feeling weak, "feverish," and having trouble breathing today. Pt is concerned about injuries d/t hx of necrotizing fasciitis.

## 2023-02-05 NOTE — Discharge Instructions (Signed)
Due to your concern for early symptoms of sepsis, it is best that you be evaluated in the Emergency Room.  Due to not feeling safe to drive, and not having a ride, EMS transport was contacted on your behalf for transportation to the Emergency Room for appropriate workup and treatment.

## 2023-02-05 NOTE — ED Notes (Signed)
EMS here to transport pt to hospital. Pt is a/o and in no acute distress.

## 2023-02-28 ENCOUNTER — Ambulatory Visit (INDEPENDENT_AMBULATORY_CARE_PROVIDER_SITE_OTHER): Payer: Medicaid Other | Admitting: Infectious Disease

## 2023-02-28 ENCOUNTER — Other Ambulatory Visit: Payer: Self-pay

## 2023-02-28 ENCOUNTER — Encounter: Payer: Self-pay | Admitting: Infectious Disease

## 2023-02-28 VITALS — BP 123/74 | HR 79 | Resp 16 | Ht 70.0 in | Wt 182.0 lb

## 2023-02-28 DIAGNOSIS — Z2981 Encounter for HIV pre-exposure prophylaxis: Secondary | ICD-10-CM

## 2023-02-28 DIAGNOSIS — M4626 Osteomyelitis of vertebra, lumbar region: Secondary | ICD-10-CM | POA: Diagnosis not present

## 2023-02-28 DIAGNOSIS — F1990 Other psychoactive substance use, unspecified, uncomplicated: Secondary | ICD-10-CM

## 2023-02-28 DIAGNOSIS — B182 Chronic viral hepatitis C: Secondary | ICD-10-CM

## 2023-02-28 DIAGNOSIS — Z7189 Other specified counseling: Secondary | ICD-10-CM

## 2023-02-28 MED ORDER — AMOXICILLIN-POT CLAVULANATE 875-125 MG PO TABS: 1.0000 | ORAL_TABLET | Freq: Two times a day (BID) | ORAL | 1 refills | Status: AC
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
## 2023-03-02 ENCOUNTER — Encounter: Payer: Self-pay | Admitting: Pharmacist

## 2023-03-03 ENCOUNTER — Telehealth: Payer: Self-pay | Admitting: Infectious Disease

## 2023-03-03 ENCOUNTER — Other Ambulatory Visit: Payer: Self-pay

## 2023-03-03 ENCOUNTER — Other Ambulatory Visit (HOSPITAL_COMMUNITY): Payer: Self-pay

## 2023-03-03 LAB — C-REACTIVE PROTEIN: CRP: 1.6 mg/L (ref <8.0)

## 2023-03-03 LAB — HIV-1 RNA QUANT-NO REFLEX-BLD
HIV 1 RNA Quant: NOT DETECTED Copies/mL
HIV-1 RNA Quant, Log: NOT DETECTED Log cps/mL

## 2023-03-03 LAB — SEDIMENTATION RATE: Sed Rate: 6 mm/h (ref 0–20)

## 2023-03-03 MED ORDER — EMTRICITABINE-TENOFOVIR DF 200-300 MG PO TABS
1.0000 | ORAL_TABLET | Freq: Every day | ORAL | 0 refills | Status: AC
  Filled 2023-03-03 (×2): qty 30, 30d supply, fill #0

## 2023-03-03 NOTE — Telephone Encounter (Signed)
I sent her a mychart message yesterday after she didn't answer her phone when I called asking about the injection. No reply there either.

## 2023-03-03 NOTE — Telephone Encounter (Signed)
Attempted to contact patient - no answer, no voicemail set up to leave a message. Will also try sending a my chart message.

## 2023-03-03 NOTE — Telephone Encounter (Signed)
A prescription for a month of Truvada for preexposure prophylaxis.  Patient would like to go over to Apretude for PrEP if her and insurance will cover it

## 2023-03-03 NOTE — Telephone Encounter (Signed)
She responded! I'm working to get her scheduled for her first injection. :) Thanks!

## 2023-03-07 ENCOUNTER — Encounter: Payer: Self-pay | Admitting: Pharmacist

## 2023-03-10 ENCOUNTER — Other Ambulatory Visit: Payer: Self-pay | Admitting: Pharmacist

## 2023-03-10 ENCOUNTER — Other Ambulatory Visit (HOSPITAL_COMMUNITY): Payer: Self-pay

## 2023-03-10 DIAGNOSIS — Z79899 Other long term (current) drug therapy: Secondary | ICD-10-CM

## 2023-03-10 MED ORDER — APRETUDE 600 MG/3ML IM SUER
600.0000 mg | INTRAMUSCULAR | 5 refills | Status: AC
Start: 2023-03-10 — End: ?
  Filled 2023-03-10: qty 3, 60d supply, fill #0

## 2023-03-10 MED ORDER — APRETUDE 600 MG/3ML IM SUER
600.0000 mg | INTRAMUSCULAR | 1 refills | Status: AC
Start: 2023-03-10 — End: ?
  Filled 2023-03-10 (×2): qty 3, 30d supply, fill #0
  Filled 2023-04-05: qty 3, 30d supply, fill #1

## 2023-03-11 ENCOUNTER — Other Ambulatory Visit: Payer: Self-pay

## 2023-03-14 ENCOUNTER — Ambulatory Visit: Payer: Medicaid Other | Admitting: Pharmacist

## 2023-03-14 ENCOUNTER — Telehealth: Payer: Self-pay

## 2023-03-14 NOTE — Telephone Encounter (Signed)
[facility] Patient Advocate Encounter  Patient's medication (Apretude) have been couriered to [facility] from [facility] and will be administered on the patient next office visit on 03/17/23.  Clearance Coots , CPhT Specialty Pharmacy Patient [facility] for Infectious Disease Phone: [phone] Fax:  [phone]

## 2023-03-17 ENCOUNTER — Ambulatory Visit: Payer: Medicaid Other | Admitting: Pharmacist
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
## 2023-03-28 ENCOUNTER — Ambulatory Visit: Payer: Self-pay | Admitting: Infectious Disease

## 2023-04-05 ENCOUNTER — Other Ambulatory Visit (HOSPITAL_COMMUNITY): Payer: Self-pay

## 2023-04-06 ENCOUNTER — Other Ambulatory Visit: Payer: Self-pay

## 2023-04-07 ENCOUNTER — Telehealth: Payer: Self-pay

## 2023-04-07 NOTE — Telephone Encounter (Signed)
[facility] Patient Advocate Encounter  Patient's medication (Apretude) have been couriered to [facility] from [facility] and will be administered on the patient next office visit .  Avana Kreiser , CPhT Specialty Pharmacy Patient [facility] Phone: [phone] Fax:  [phone]  

## 2024-02-14 IMAGING — CT CT EXTREM UP ENTIRE ARM*L* W/O CM
2 of 3 series · 14 of 33 positions shown, 17 images · non-contrast
Comparison: None.

CLINICAL DATA: Soft tissue infection suspected

EXAM:
CT OF THE UPPER LEFT EXTREMITY WITHOUT CONTRAST
TECHNIQUE: Multidetector CT imaging of the upper left extremity was performed
according to the standard protocol.
RADIATION DOSE REDUCTION: This exam was performed according to the
departmental dose-optimization program which includes automated
exposure control, adjustment of the mA and/or kV according to
patient size and/or use of iterative reconstruction technique.

[Series 6: axial soft · axial · 0.59mm/px · z∈[+502,+1108]mm · 11 of 359 slices shown, 14 images]
[im 28/359  soft-tissue]
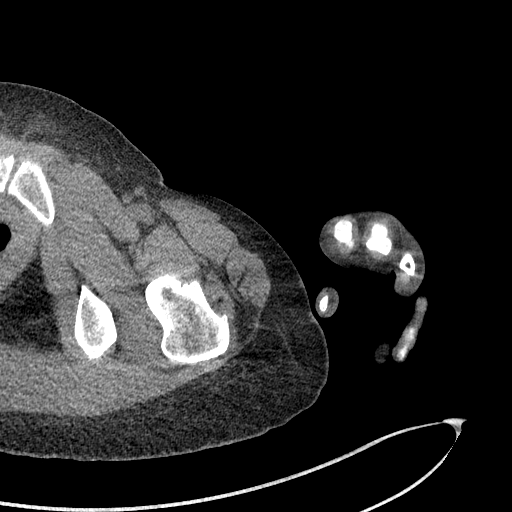
[im 28/359  bone]
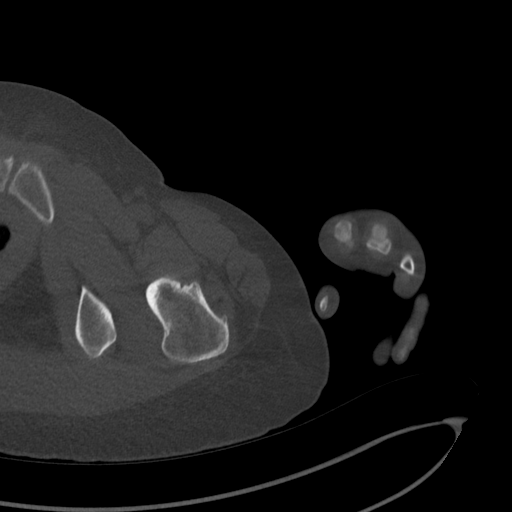
[im 56/359  bone]
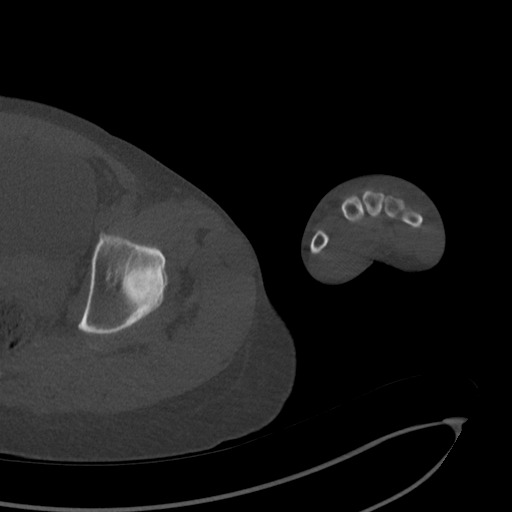
[im 83/359  bone]
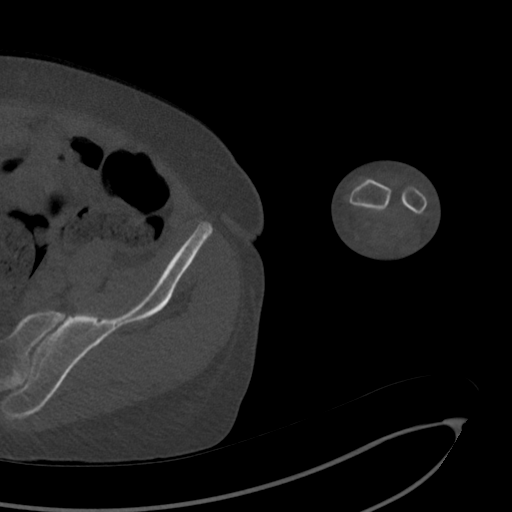
[im 111/359  bone]
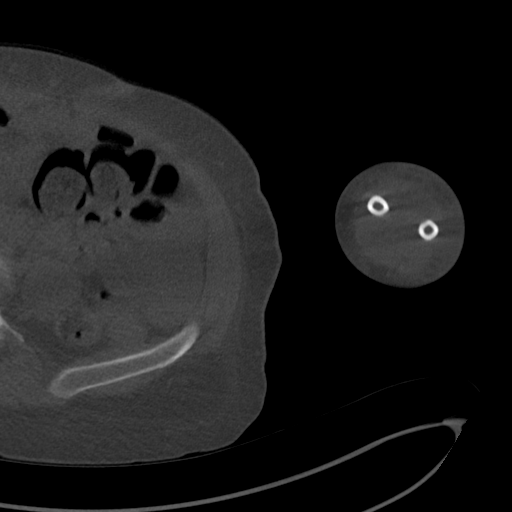
[im 138/359  soft-tissue]
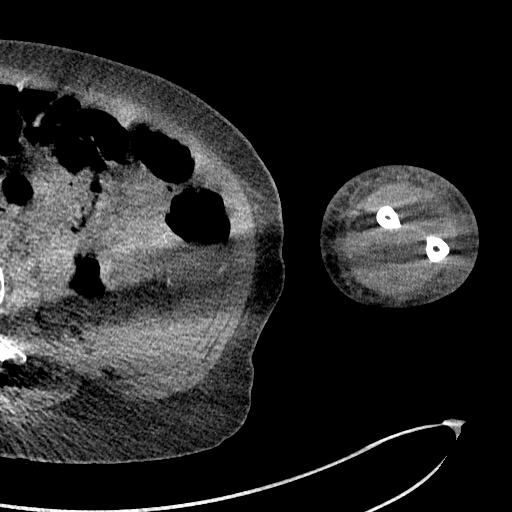
[im 138/359  bone]
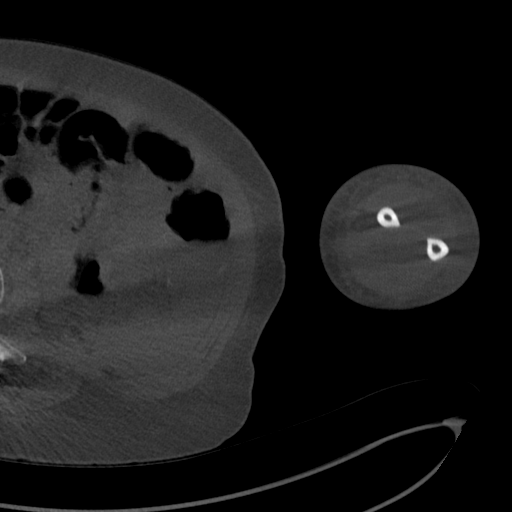
[im 193/359  bone]
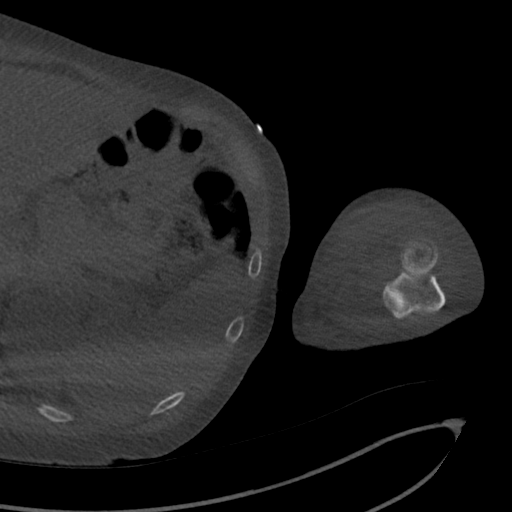
[im 221/359  bone]
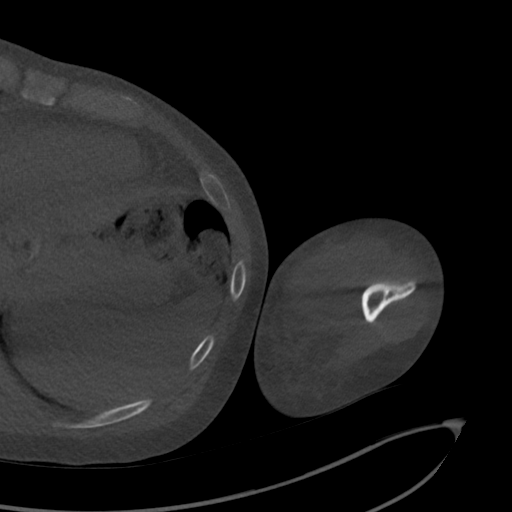
[im 248/359  bone]
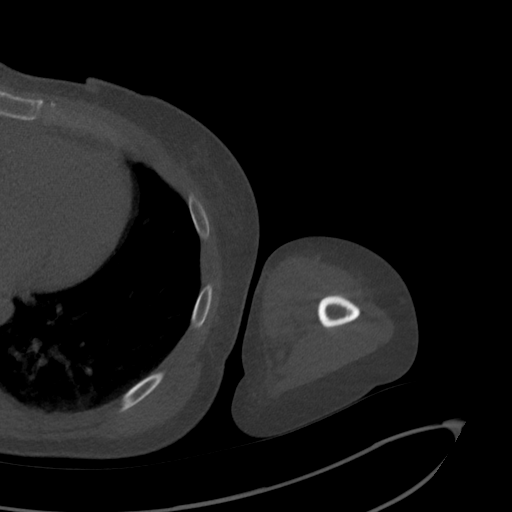
[im 276/359  soft-tissue]
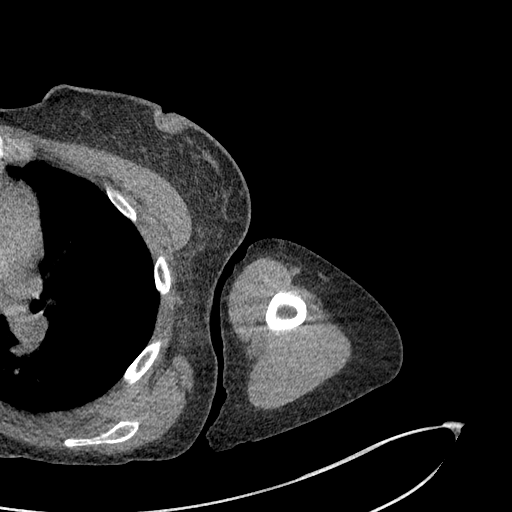
[im 276/359  bone]
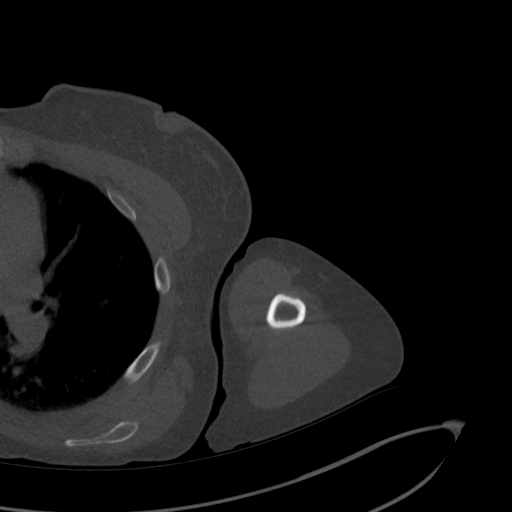
[im 303/359  bone]
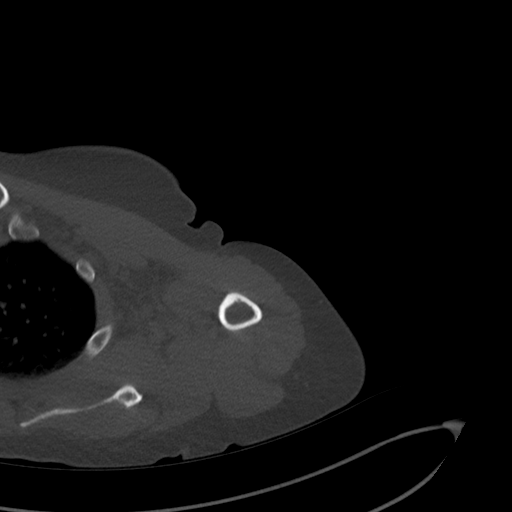
[im 331/359  bone]
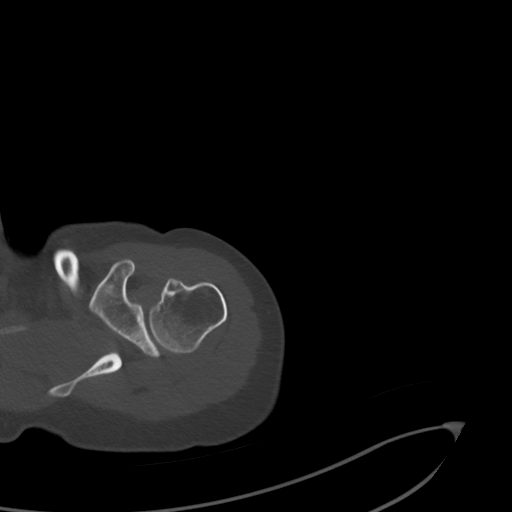

[Series 8: coronal soft · coronal · 0.75mm/px · 3 of 104 slices shown]
[im 21/104  bone]
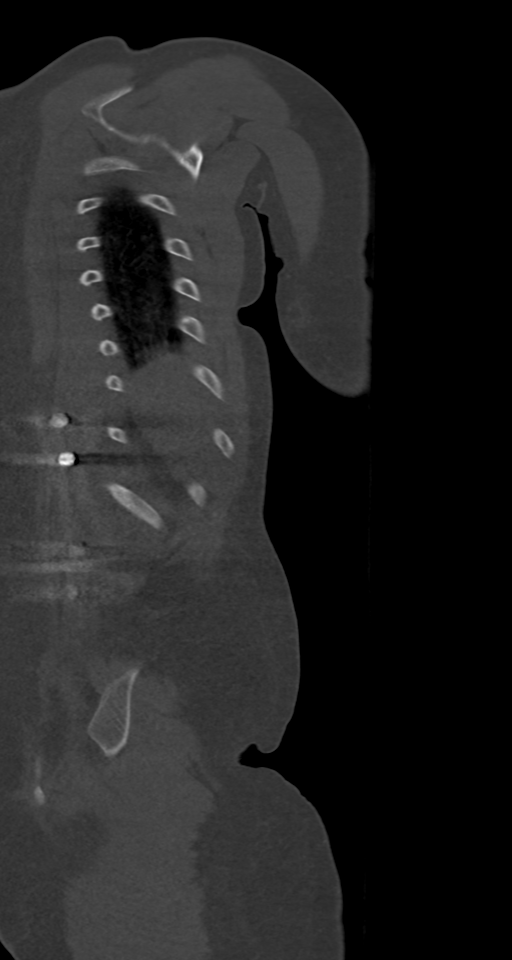
[im 42/104  bone]
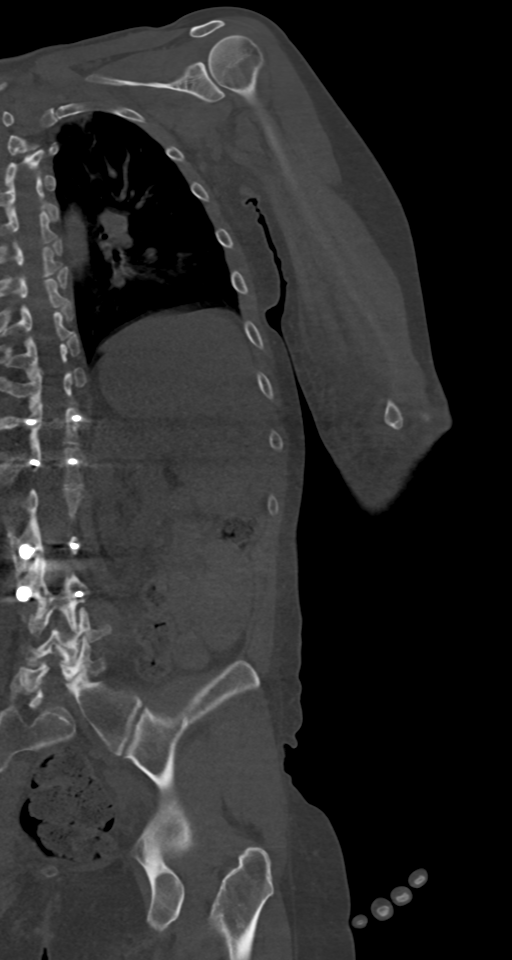
[im 62/104  bone]
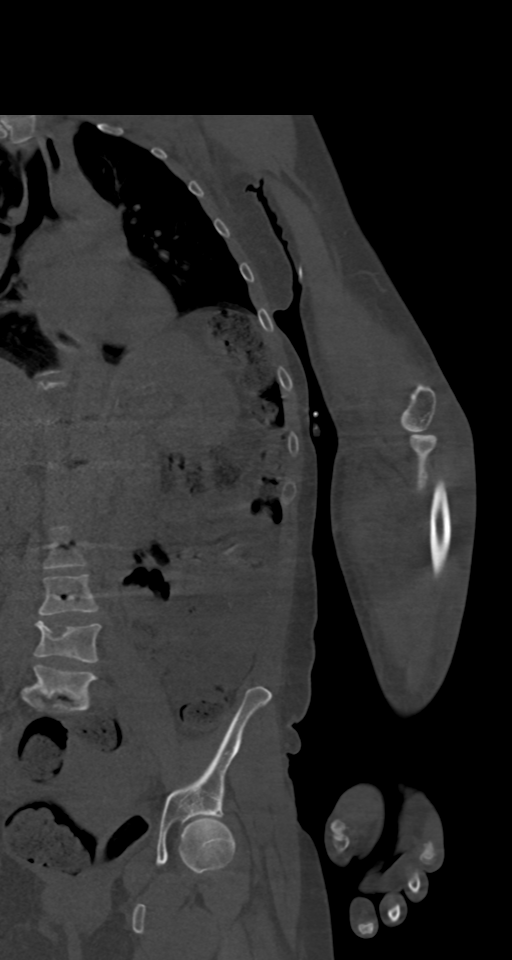

[14 of 33 positions shown; findings below may reference images not displayed]

FINDINGS: Bones/Joint/Cartilage

Negative

Ligaments

Suboptimally assessed by CT.

Muscles and Tendons

Limited assessment of the deep soft tissues without intravenous
contrast

Soft tissues

There is subcutaneous inflammatory change circumferentially within
the left arm and forearm. No fluid collection.

Limited visualization of the intrathoracic and intraperitoneal
structures is unremarkable.
IMPRESSION: Subcutaneous inflammatory change circumferentially within the left
arm and forearm, compatible with cellulitis. No fluid collection.

## 2024-05-15 ENCOUNTER — Emergency Department (HOSPITAL_BASED_OUTPATIENT_CLINIC_OR_DEPARTMENT_OTHER): Payer: MEDICAID

## 2024-05-15 ENCOUNTER — Encounter (HOSPITAL_BASED_OUTPATIENT_CLINIC_OR_DEPARTMENT_OTHER): Payer: Self-pay | Admitting: Emergency Medicine

## 2024-05-15 ENCOUNTER — Other Ambulatory Visit: Payer: Self-pay

## 2024-05-15 ENCOUNTER — Emergency Department (HOSPITAL_BASED_OUTPATIENT_CLINIC_OR_DEPARTMENT_OTHER)
Admission: EM | Admit: 2024-05-15 | Discharge: 2024-05-15 | Disposition: A | Discharge status: Home / Self Care | Payer: MEDICAID | Attending: Emergency Medicine | Admitting: Emergency Medicine

## 2024-05-15 DIAGNOSIS — R519 Headache, unspecified: Secondary | ICD-10-CM | POA: Insufficient documentation

## 2024-05-15 DIAGNOSIS — M542 Cervicalgia: Secondary | ICD-10-CM | POA: Insufficient documentation

## 2024-05-15 DIAGNOSIS — W228XXA Striking against or struck by other objects, initial encounter: Secondary | ICD-10-CM | POA: Insufficient documentation

## 2024-05-15 LAB — PREGNANCY, URINE: Preg Test, Ur: NEGATIVE
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
